# Patient Record
Sex: Female | Born: 1960
Health system: Southern US, Community
[De-identification: ages and names within clinical notes are randomized; demographics above are authoritative.]

## PROBLEM LIST (undated history)

## (undated) DIAGNOSIS — N6019 Diffuse cystic mastopathy of unspecified breast: Secondary | ICD-10-CM

## (undated) DIAGNOSIS — K219 Gastro-esophageal reflux disease without esophagitis: Secondary | ICD-10-CM

## (undated) DIAGNOSIS — Z780 Asymptomatic menopausal state: Secondary | ICD-10-CM

## (undated) DIAGNOSIS — Z923 Personal history of irradiation: Secondary | ICD-10-CM

## (undated) DIAGNOSIS — C50919 Malignant neoplasm of unspecified site of unspecified female breast: Secondary | ICD-10-CM

## (undated) DIAGNOSIS — D649 Anemia, unspecified: Secondary | ICD-10-CM

## (undated) DIAGNOSIS — F410 Panic disorder [episodic paroxysmal anxiety] without agoraphobia: Secondary | ICD-10-CM

## (undated) DIAGNOSIS — E785 Hyperlipidemia, unspecified: Secondary | ICD-10-CM

## (undated) DIAGNOSIS — M1990 Unspecified osteoarthritis, unspecified site: Secondary | ICD-10-CM

## (undated) DIAGNOSIS — C801 Malignant (primary) neoplasm, unspecified: Secondary | ICD-10-CM

## (undated) HISTORY — PX: COLONOSCOPY: SHX174

## (undated) HISTORY — DX: Unspecified osteoarthritis, unspecified site: M19.90

## (undated) HISTORY — DX: Diffuse cystic mastopathy of unspecified breast: N60.19

## (undated) HISTORY — PX: VAGINAL HYSTERECTOMY: SUR661

## (undated) HISTORY — DX: Panic disorder (episodic paroxysmal anxiety): F41.0

## (undated) HISTORY — PX: OTHER SURGICAL HISTORY: SHX169

## (undated) HISTORY — DX: Hyperlipidemia, unspecified: E78.5

## (undated) HISTORY — DX: Asymptomatic menopausal state: Z78.0

## (undated) HISTORY — DX: Anemia, unspecified: D64.9

## (undated) HISTORY — PX: PILONIDAL CYST EXCISION: SHX744

---

## 1998-01-16 ENCOUNTER — Inpatient Hospital Stay (HOSPITAL_COMMUNITY)
Admission: AD | Admit: 1998-01-16 | Discharge: 1998-01-16 | Payer: Self-pay | Admitting: Physical Medicine & Rehabilitation

## 1998-01-25 ENCOUNTER — Inpatient Hospital Stay (HOSPITAL_COMMUNITY): Admission: AD | Admit: 1998-01-25 | Discharge: 1998-01-25 | Payer: Self-pay | Admitting: Obstetrics and Gynecology

## 1998-02-07 ENCOUNTER — Other Ambulatory Visit: Admission: RE | Admit: 1998-02-07 | Discharge: 1998-02-07 | Payer: Self-pay | Admitting: Obstetrics & Gynecology

## 1998-05-11 ENCOUNTER — Inpatient Hospital Stay (HOSPITAL_COMMUNITY): Admission: AD | Admit: 1998-05-11 | Discharge: 1998-06-01 | Payer: Self-pay | Admitting: Obstetrics & Gynecology

## 1998-05-12 ENCOUNTER — Encounter: Payer: Self-pay | Admitting: Obstetrics and Gynecology

## 1998-05-13 ENCOUNTER — Encounter: Payer: Self-pay | Admitting: Obstetrics and Gynecology

## 1998-05-15 ENCOUNTER — Encounter: Payer: Self-pay | Admitting: Obstetrics and Gynecology

## 1998-05-19 ENCOUNTER — Encounter: Payer: Self-pay | Admitting: Obstetrics and Gynecology

## 1998-05-27 ENCOUNTER — Encounter: Payer: Self-pay | Admitting: Obstetrics and Gynecology

## 1998-05-31 ENCOUNTER — Encounter (HOSPITAL_COMMUNITY): Admission: RE | Admit: 1998-05-31 | Discharge: 1998-08-29 | Payer: Self-pay | Admitting: Obstetrics and Gynecology

## 1998-07-06 ENCOUNTER — Other Ambulatory Visit: Admission: RE | Admit: 1998-07-06 | Discharge: 1998-07-06 | Payer: Self-pay | Admitting: Obstetrics & Gynecology

## 1998-08-16 ENCOUNTER — Inpatient Hospital Stay (HOSPITAL_COMMUNITY): Admission: AD | Admit: 1998-08-16 | Discharge: 1998-08-17 | Payer: Self-pay | Admitting: Obstetrics and Gynecology

## 1999-07-13 ENCOUNTER — Other Ambulatory Visit: Admission: RE | Admit: 1999-07-13 | Discharge: 1999-07-13 | Payer: Self-pay | Admitting: Obstetrics and Gynecology

## 2001-08-27 ENCOUNTER — Other Ambulatory Visit: Admission: RE | Admit: 2001-08-27 | Discharge: 2001-08-27 | Payer: Self-pay | Admitting: Obstetrics and Gynecology

## 2002-09-09 ENCOUNTER — Other Ambulatory Visit: Admission: RE | Admit: 2002-09-09 | Discharge: 2002-09-09 | Payer: Self-pay | Admitting: Physical Therapy

## 2003-11-03 ENCOUNTER — Encounter: Admission: RE | Admit: 2003-11-03 | Discharge: 2003-11-03 | Payer: Self-pay | Admitting: Obstetrics and Gynecology

## 2004-05-05 ENCOUNTER — Encounter: Admission: RE | Admit: 2004-05-05 | Discharge: 2004-05-05 | Payer: Self-pay | Admitting: Obstetrics and Gynecology

## 2004-08-29 ENCOUNTER — Ambulatory Visit: Payer: Self-pay | Admitting: Family Medicine

## 2004-11-20 ENCOUNTER — Ambulatory Visit: Payer: Self-pay | Admitting: Family Medicine

## 2004-12-27 ENCOUNTER — Ambulatory Visit: Payer: Self-pay | Admitting: Family Medicine

## 2006-04-01 ENCOUNTER — Ambulatory Visit: Payer: Self-pay | Admitting: Family Medicine

## 2006-04-10 ENCOUNTER — Other Ambulatory Visit: Admission: RE | Admit: 2006-04-10 | Discharge: 2006-04-10 | Payer: Self-pay | Admitting: Family Medicine

## 2006-04-10 ENCOUNTER — Ambulatory Visit: Payer: Self-pay | Admitting: Family Medicine

## 2006-04-19 ENCOUNTER — Encounter: Admission: RE | Admit: 2006-04-19 | Discharge: 2006-04-19 | Payer: Self-pay | Admitting: Family Medicine

## 2006-05-21 ENCOUNTER — Ambulatory Visit: Payer: Self-pay | Admitting: Family Medicine

## 2006-09-06 ENCOUNTER — Emergency Department: Payer: Self-pay | Admitting: Emergency Medicine

## 2006-10-23 ENCOUNTER — Telehealth (INDEPENDENT_AMBULATORY_CARE_PROVIDER_SITE_OTHER): Payer: Self-pay | Admitting: *Deleted

## 2007-03-22 ENCOUNTER — Ambulatory Visit: Payer: Self-pay | Admitting: Family Medicine

## 2007-04-17 ENCOUNTER — Telehealth: Payer: Self-pay | Admitting: Family Medicine

## 2007-06-04 ENCOUNTER — Encounter: Admission: RE | Admit: 2007-06-04 | Discharge: 2007-06-04 | Payer: Self-pay | Admitting: Family Medicine

## 2007-06-11 ENCOUNTER — Encounter: Admission: RE | Admit: 2007-06-11 | Discharge: 2007-06-11 | Payer: Self-pay | Admitting: Family Medicine

## 2007-06-17 DIAGNOSIS — R928 Other abnormal and inconclusive findings on diagnostic imaging of breast: Secondary | ICD-10-CM | POA: Insufficient documentation

## 2007-07-25 ENCOUNTER — Encounter: Payer: Self-pay | Admitting: Family Medicine

## 2007-09-08 ENCOUNTER — Encounter: Admission: RE | Admit: 2007-09-08 | Discharge: 2007-09-08 | Payer: Self-pay | Admitting: General Surgery

## 2007-10-10 ENCOUNTER — Telehealth: Payer: Self-pay | Admitting: Family Medicine

## 2007-10-17 ENCOUNTER — Encounter: Payer: Self-pay | Admitting: Family Medicine

## 2007-10-17 DIAGNOSIS — F410 Panic disorder [episodic paroxysmal anxiety] without agoraphobia: Secondary | ICD-10-CM | POA: Insufficient documentation

## 2007-10-20 ENCOUNTER — Ambulatory Visit: Payer: Self-pay | Admitting: Family Medicine

## 2007-10-20 DIAGNOSIS — N6019 Diffuse cystic mastopathy of unspecified breast: Secondary | ICD-10-CM | POA: Insufficient documentation

## 2007-10-21 LAB — CONVERTED CEMR LAB
Alkaline Phosphatase: 66 units/L (ref 39–117)
BUN: 11 mg/dL (ref 6–23)
Basophils Absolute: 0 10*3/uL (ref 0.0–0.1)
Bilirubin, Direct: 0.1 mg/dL (ref 0.0–0.3)
CO2: 29 meq/L (ref 19–32)
Calcium: 9.3 mg/dL (ref 8.4–10.5)
Creatinine, Ser: 0.7 mg/dL (ref 0.4–1.2)
Eosinophils Relative: 1.6 % (ref 0.0–5.0)
GFR calc Af Amer: 115 mL/min
HCT: 35.3 % — ABNORMAL LOW (ref 36.0–46.0)
Hemoglobin: 11.4 g/dL — ABNORMAL LOW (ref 12.0–15.0)
LDL Cholesterol: 101 mg/dL — ABNORMAL HIGH (ref 0–99)
Monocytes Relative: 10.6 % (ref 3.0–12.0)
RBC: 4.08 M/uL (ref 3.87–5.11)
RDW: 13.2 % (ref 11.5–14.6)
Total Bilirubin: 0.7 mg/dL (ref 0.3–1.2)
Total Protein: 7.3 g/dL (ref 6.0–8.3)
VLDL: 11 mg/dL (ref 0–40)
WBC: 4 10*3/uL — ABNORMAL LOW (ref 4.5–10.5)

## 2007-11-12 ENCOUNTER — Ambulatory Visit: Payer: Self-pay | Admitting: Family Medicine

## 2007-11-12 LAB — FECAL OCCULT BLOOD, GUAIAC

## 2007-11-14 ENCOUNTER — Ambulatory Visit: Payer: Self-pay | Admitting: Family Medicine

## 2007-11-14 DIAGNOSIS — D649 Anemia, unspecified: Secondary | ICD-10-CM | POA: Insufficient documentation

## 2007-11-17 LAB — CONVERTED CEMR LAB
Eosinophils Relative: 1.6 % (ref 0.0–5.0)
HCT: 38.1 % (ref 36.0–46.0)
Lymphocytes Relative: 47.4 % — ABNORMAL HIGH (ref 12.0–46.0)
MCHC: 32.7 g/dL (ref 30.0–36.0)
MCV: 85.7 fL (ref 78.0–100.0)
Monocytes Relative: 11.1 % (ref 3.0–12.0)
Neutro Abs: 1.3 10*3/uL — ABNORMAL LOW (ref 1.4–7.7)
Platelets: 256 10*3/uL (ref 150–400)
RDW: 13.2 % (ref 11.5–14.6)
Saturation Ratios: 13.2 % — ABNORMAL LOW (ref 20.0–50.0)

## 2008-01-06 ENCOUNTER — Telehealth (INDEPENDENT_AMBULATORY_CARE_PROVIDER_SITE_OTHER): Payer: Self-pay | Admitting: *Deleted

## 2008-02-27 ENCOUNTER — Encounter: Admission: RE | Admit: 2008-02-27 | Discharge: 2008-02-27 | Payer: Self-pay | Admitting: General Surgery

## 2008-05-05 ENCOUNTER — Ambulatory Visit: Payer: Self-pay | Admitting: Family Medicine

## 2008-06-01 ENCOUNTER — Encounter: Admission: RE | Admit: 2008-06-01 | Discharge: 2008-06-01 | Payer: Self-pay | Admitting: General Surgery

## 2008-06-01 ENCOUNTER — Encounter: Payer: Self-pay | Admitting: Family Medicine

## 2008-06-09 ENCOUNTER — Telehealth: Payer: Self-pay | Admitting: Family Medicine

## 2008-10-06 ENCOUNTER — Encounter: Payer: Self-pay | Admitting: Family Medicine

## 2008-10-22 ENCOUNTER — Ambulatory Visit: Payer: Self-pay | Admitting: Family Medicine

## 2008-10-27 LAB — CONVERTED CEMR LAB
AST: 21 units/L (ref 0–37)
Albumin: 3.6 g/dL (ref 3.5–5.2)
Alkaline Phosphatase: 53 units/L (ref 39–117)
Basophils Absolute: 0 10*3/uL (ref 0.0–0.1)
Basophils Relative: 0.7 % (ref 0.0–3.0)
Bilirubin, Direct: 0.2 mg/dL (ref 0.0–0.3)
CO2: 29 meq/L (ref 19–32)
Calcium: 9.2 mg/dL (ref 8.4–10.5)
Creatinine, Ser: 0.7 mg/dL (ref 0.4–1.2)
GFR calc non Af Amer: 114.13 mL/min (ref >60)
Glucose, Bld: 88 mg/dL (ref 70–99)
HCT: 36.1 % (ref 36.0–46.0)
Hemoglobin: 11.9 g/dL — ABNORMAL LOW (ref 12.0–15.0)
Lymphocytes Relative: 43.4 % (ref 12.0–46.0)
Lymphs Abs: 1.7 10*3/uL (ref 0.7–4.0)
Neutro Abs: 1.6 10*3/uL (ref 1.4–7.7)
RBC: 4.21 M/uL (ref 3.87–5.11)
Sodium: 140 meq/L (ref 135–145)
TSH: 0.67 microintl units/mL (ref 0.35–5.50)
WBC: 3.8 10*3/uL — ABNORMAL LOW (ref 4.5–10.5)

## 2008-10-29 ENCOUNTER — Telehealth: Payer: Self-pay | Admitting: Family Medicine

## 2008-11-02 ENCOUNTER — Telehealth: Payer: Self-pay | Admitting: Family Medicine

## 2008-12-16 ENCOUNTER — Ambulatory Visit: Payer: Self-pay | Admitting: Family Medicine

## 2008-12-22 LAB — CONVERTED CEMR LAB
Eosinophils Relative: 1.2 % (ref 0.0–5.0)
Lymphocytes Relative: 39.2 % (ref 12.0–46.0)
Lymphs Abs: 1.5 10*3/uL (ref 0.7–4.0)
MCV: 86.2 fL (ref 78.0–100.0)
Neutro Abs: 1.9 10*3/uL (ref 1.4–7.7)
Neutrophils Relative %: 47 % (ref 43.0–77.0)
RDW: 12.9 % (ref 11.5–14.6)

## 2008-12-27 ENCOUNTER — Ambulatory Visit: Payer: Self-pay | Admitting: Family Medicine

## 2008-12-30 LAB — CONVERTED CEMR LAB
Basophils Absolute: 0 10*3/uL (ref 0.0–0.1)
Basophils Relative: 0.4 % (ref 0.0–3.0)
Folate: 18.1 ng/mL
Lymphocytes Relative: 46.7 % — ABNORMAL HIGH (ref 12.0–46.0)
Monocytes Absolute: 0.4 10*3/uL (ref 0.1–1.0)
Monocytes Relative: 10.6 % (ref 3.0–12.0)
Neutro Abs: 1.7 10*3/uL (ref 1.4–7.7)
Neutrophils Relative %: 41.2 % — ABNORMAL LOW (ref 43.0–77.0)
Platelets: 240 10*3/uL (ref 150.0–400.0)
Saturation Ratios: 33.1 % (ref 20.0–50.0)
Transferrin: 200.6 mg/dL — ABNORMAL LOW (ref 212.0–360.0)
WBC: 4.1 10*3/uL — ABNORMAL LOW (ref 4.5–10.5)

## 2009-05-03 ENCOUNTER — Telehealth: Payer: Self-pay | Admitting: Family Medicine

## 2009-06-06 ENCOUNTER — Encounter: Admission: RE | Admit: 2009-06-06 | Discharge: 2009-06-06 | Payer: Self-pay | Admitting: Obstetrics and Gynecology

## 2009-06-06 ENCOUNTER — Ambulatory Visit: Payer: Self-pay | Admitting: Family Medicine

## 2009-06-06 DIAGNOSIS — F4321 Adjustment disorder with depressed mood: Secondary | ICD-10-CM | POA: Insufficient documentation

## 2009-07-12 ENCOUNTER — Telehealth: Payer: Self-pay | Admitting: Family Medicine

## 2010-01-04 ENCOUNTER — Telehealth: Payer: Self-pay | Admitting: Family Medicine

## 2010-01-17 ENCOUNTER — Telehealth (INDEPENDENT_AMBULATORY_CARE_PROVIDER_SITE_OTHER): Payer: Self-pay | Admitting: *Deleted

## 2010-01-17 ENCOUNTER — Ambulatory Visit: Payer: Self-pay | Admitting: Family Medicine

## 2010-01-18 LAB — CONVERTED CEMR LAB
ALT: 25 U/L (ref 0–35)
AST: 23 U/L (ref 0–37)
Albumin: 3.7 g/dL (ref 3.5–5.2)
Alkaline Phosphatase: 65 U/L (ref 39–117)
BUN: 15 mg/dL (ref 6–23)
Basophils Absolute: 0 K/uL (ref 0.0–0.1)
Basophils Relative: 1 % (ref 0.0–3.0)
Bilirubin, Direct: 0.1 mg/dL (ref 0.0–0.3)
CO2: 30 meq/L (ref 19–32)
Calcium: 9.7 mg/dL (ref 8.4–10.5)
Chloride: 112 meq/L (ref 96–112)
Cholesterol: 191 mg/dL (ref 0–200)
Creatinine, Ser: 0.7 mg/dL (ref 0.4–1.2)
Eosinophils Absolute: 0.1 K/uL (ref 0.0–0.7)
Eosinophils Relative: 2.2 % (ref 0.0–5.0)
GFR calc non Af Amer: 117.42 mL/min (ref >60)
Glucose, Bld: 88 mg/dL (ref 70–99)
HCT: 34.7 % — ABNORMAL LOW (ref 36.0–46.0)
HDL: 73 mg/dL (ref >39.00)
Hemoglobin: 11.4 g/dL — ABNORMAL LOW (ref 12.0–15.0)
LDL Cholesterol: 108 mg/dL — ABNORMAL HIGH (ref 0–99)
Lymphocytes Relative: 46.8 % — ABNORMAL HIGH (ref 12.0–46.0)
Lymphs Abs: 1.7 K/uL (ref 0.7–4.0)
MCHC: 33 g/dL (ref 30.0–36.0)
MCV: 86.2 fL (ref 78.0–100.0)
Monocytes Absolute: 0.4 K/uL (ref 0.1–1.0)
Monocytes Relative: 11.8 % (ref 3.0–12.0)
Neutro Abs: 1.4 K/uL (ref 1.4–7.7)
Neutrophils Relative %: 38.2 % — ABNORMAL LOW (ref 43.0–77.0)
Platelets: 245 K/uL (ref 150.0–400.0)
Potassium: 4.6 meq/L (ref 3.5–5.1)
RBC: 4.03 M/uL (ref 3.87–5.11)
RDW: 14.4 % (ref 11.5–14.6)
Sodium: 145 meq/L (ref 135–145)
TSH: 1.04 u[IU]/mL (ref 0.35–5.50)
Total Bilirubin: 0.6 mg/dL (ref 0.3–1.2)
Total CHOL/HDL Ratio: 3
Total Protein: 6.8 g/dL (ref 6.0–8.3)
Triglycerides: 49 mg/dL (ref 0.0–149.0)
VLDL: 9.8 mg/dL (ref 0.0–40.0)
WBC: 3.7 10*3/microliter — ABNORMAL LOW (ref 4.5–10.5)

## 2010-01-26 ENCOUNTER — Ambulatory Visit: Payer: Self-pay | Admitting: Family Medicine

## 2010-01-27 ENCOUNTER — Encounter (INDEPENDENT_AMBULATORY_CARE_PROVIDER_SITE_OTHER): Payer: Self-pay | Admitting: *Deleted

## 2010-04-12 ENCOUNTER — Encounter (INDEPENDENT_AMBULATORY_CARE_PROVIDER_SITE_OTHER): Payer: Self-pay | Admitting: *Deleted

## 2010-06-01 ENCOUNTER — Encounter (INDEPENDENT_AMBULATORY_CARE_PROVIDER_SITE_OTHER): Payer: Self-pay

## 2010-06-06 ENCOUNTER — Ambulatory Visit: Payer: Self-pay | Admitting: Gastroenterology

## 2010-06-07 ENCOUNTER — Encounter
Admission: RE | Admit: 2010-06-07 | Discharge: 2010-06-07 | Payer: Self-pay | Source: Home / Self Care | Attending: Obstetrics and Gynecology | Admitting: Obstetrics and Gynecology

## 2010-06-07 LAB — HM MAMMOGRAPHY: HM Mammogram: NORMAL

## 2010-06-08 ENCOUNTER — Telehealth: Payer: Self-pay | Admitting: Family Medicine

## 2010-06-13 ENCOUNTER — Encounter: Payer: Self-pay | Admitting: Family Medicine

## 2010-06-13 ENCOUNTER — Encounter (INDEPENDENT_AMBULATORY_CARE_PROVIDER_SITE_OTHER): Payer: Self-pay | Admitting: *Deleted

## 2010-06-20 ENCOUNTER — Encounter: Payer: Self-pay | Admitting: Gastroenterology

## 2010-06-20 ENCOUNTER — Ambulatory Visit
Admission: RE | Admit: 2010-06-20 | Discharge: 2010-06-20 | Payer: Self-pay | Source: Home / Self Care | Attending: Gastroenterology | Admitting: Gastroenterology

## 2010-06-20 LAB — HM COLONOSCOPY

## 2010-07-18 NOTE — Progress Notes (Signed)
Summary: Rx Alprazolam  Phone Note Refill Request Call back at [phone] Message from:  Patient on July 12, 2009 12:55 PM  Refills Requested: Medication #1:  ALPRAZOLAM 0.5 MG  TABS 1 by mouth two times a day as needed anxiety Patient would like a refill on Alprazolam.  She said she recently had death in her family.  Please advise.  [facility]     Method Requested: Telephone to Pharmacy Initial call taken by: Linde Gillis CMA Duncan Dull),  July 12, 2009 12:56 PM  Follow-up for Phone Call        use this sparingly and with caution--no driving etc -- and remind her is habit forming- to not use regularly px written on EMR for call in  Follow-up by: Judith Part MD,  July 12, 2009 1:46 PM  Additional Follow-up for Phone Call Additional follow up Details #1::        Med called to [facility] advising pt. Additional Follow-up by: Lowella Petties CMA,  July 12, 2009 2:58 PM    Prescriptions: ALPRAZOLAM 0.5 MG  TABS (ALPRAZOLAM) 1 by mouth two times a day as needed anxiety  #30 x 0   Entered and Authorized by:   Judith Part MD   Signed by:   Judith Part MD on 07/12/2009   Method used:   Telephoned to ...         RxID:   [id number]

## 2010-07-18 NOTE — Progress Notes (Signed)
----   Converted from flag ---- ---- 01/16/2010 9:45 PM, Judith Part MD wrote: please check lipid, wellness v70.0-thanks   ---- 01/16/2010 12:05 PM, Liane Comber CMA (AAMA) wrote: Pt is scheduled for cpx labs tomorrow, what labs to draw and dx codes? Thanks Tasha ------------------------------

## 2010-07-18 NOTE — Progress Notes (Signed)
Summary: refill request for alprazolam  Phone Note Refill Request Message from:  Fax from Pharmacy  Refills Requested: Medication #1:  ALPRAZOLAM 0.5 MG  TABS 1 by mouth two times a day as needed anxiety   Last Refilled: 07/12/2009 Faxed request from [facility],  [phone].  Initial call taken by: Lowella Petties CMA,  January 04, 2010 8:12 AM  Follow-up for Phone Call        she has f/u with me later this month Follow-up by: Judith Part MD,  January 04, 2010 8:39 AM  Additional Follow-up for Phone Call Additional follow up Details #1::        [facility] as instructed.Lewanda Rife LPN  January 04, 2010 11:17 AM     New/Updated Medications: ALPRAZOLAM 0.5 MG  TABS (ALPRAZOLAM) 1 by mouth two times a day as needed anxiety Prescriptions: ALPRAZOLAM 0.5 MG  TABS (ALPRAZOLAM) 1 by mouth two times a day as needed anxiety  #30 x 0   Entered and Authorized by:   Judith Part MD   Signed by:   Lewanda Rife LPN on 45/40/9811   Method used:   Telephoned to ...         RxID:   [id number]

## 2010-07-18 NOTE — Assessment & Plan Note (Signed)
Summary: CPX/DLO   Vital Signs:  Patient profile:   50 year old female Height:      63 inches Weight:      165 pounds BMI:     29.33 Temp:     98 degrees F oral Pulse rate:   64 / minute Pulse rhythm:   regular BP sitting:   122 / 82  (left arm) Cuff size:   regular  Vitals Entered By: Lewanda Rife LPN (January 26, 2010 9:51 AM) CC: CPX GYN does pap   History of Present Illness: here for wellness exam  hot flashes have got the best of her  sees gyn-- Dr Rana Snare early in the year  takes black kohosh - works if consistent  does not want hrt currently  she can deal with it  occ trouble with sleep - took 1/2 xanax   some more anxiety  worse with menopause  drinks a lot of coffee or tea  knows she has to get off of them -- at least 3 bev per day  is up for trying daily med    wt is up 2 lb  hx of fibrocystic breasts  also breast ca in family   dexa nl 04- gyn has not checked another one  ca and D-- is taking well   Tdap 09  colon ca screen -- wants to go ahead and schedule her colonoscopy  is interested in october   lipids stable with HDL 73 and LDL 108- good   wbc 3.7 stable and hb 11.4 - also about the same    Allergies (verified): No Known Drug Allergies  Past History:  Past Medical History: Last updated: 10/22/2008 fibrocystic breasts strong family hx of breast cancer panic disorder  post menupausal 09  gyn- Dr Rana Snare   Past Surgical History: Last updated: 10/20/2007 Pilonidal cyst Fibroid tumors Caesarean section Hysterectomy- partial Dexa- per pt (2004) MRI breast--11/07 nl   Family History: Last updated: 10/20/2007 Father: heart problems, valve replaced Mother: ? 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 Eyes:  Denies blurring and eye irritation. CV:  Denies chest pain or discomfort, lightheadness, and palpitations. Resp:  Denies cough, shortness of breath, and wheezing. GI:  Denies abdominal pain, change in bowel habits, indigestion, and nausea. GU:  Denies abnormal vaginal bleeding, discharge, and urinary frequency. MS:  Denies joint pain, joint redness, and joint swelling. Derm:  Denies itching, lesion(s), poor wound healing, and rash. Neuro:  Denies numbness and tingling. Psych:  Denies anxiety and depression. Endo:  Denies cold intolerance, excessive thirst, excessive urination, and heat intolerance. Heme:  Denies abnormal bruising and bleeding.  Physical Exam  General:  overweight but generally well appearing  Head:  normocephalic, atraumatic, and no abnormalities observed.   Eyes:  vision grossly intact, pupils equal, pupils round, and pupils reactive to light.  no conjunctival pallor, injection or icterus  Ears:  R ear normal and L ear normal.   Nose:  no nasal discharge.   Mouth:  pharynx pink and moist.   Neck:  supple with full rom and no masses or thyromegally, no JVD or carotid bruit  Chest Wall:  No deformities, masses, or tenderness noted. Lungs:  Normal respiratory  effort, chest expands symmetrically. Lungs are clear to auscultation, no crackles or wheezes. Heart:  Normal rate and regular rhythm. S1 and S2 normal without gallop, murmur, click, rub or other extra sounds. Abdomen:  Bowel sounds positive,abdomen soft and non-tender without masses, organomegaly or hernias noted. no renal bruits  Msk:  No deformity or scoliosis noted of thoracic or lumbar spine.  no acute joint changes  Pulses:  R and L carotid,radial,femoral,dorsalis pedis and posterior tibial pulses are full and equal bilaterally Extremities:  No clubbing, cyanosis, edema, or deformity noted with normal full range of motion of all  joints.   Neurologic:  sensation intact to light touch, gait normal, and DTRs symmetrical and normal.   Skin:  Intact without suspicious lesions or rashes Cervical Nodes:  No lymphadenopathy noted Inguinal Nodes:  No significant adenopathy Psych:  normal affect, talkative and pleasant    Impression & Recommendations:  Problem # 1:  HEALTH MAINTENANCE EXAM (ICD-V70.0) Assessment Comment Only  reviewed health habits including diet, exercise and skin cancer prevention reviewed health maintenance list and family history   Orders: Prescription Created Electronically [phone])  Problem # 2:  ANEMIA (ICD-285.9) Assessment: Unchanged  this is stable without change continue to monitor no symptoms  colonosc also ordered for screening  Hgb: 11.4 (01/17/2010)   Hct: 34.7 (01/17/2010)   Platelets: 245.0 (01/17/2010) RBC: 4.03 (01/17/2010)   RDW: 14.4 (01/17/2010)   WBC: 3.7 (01/17/2010) MCV: 86.2 (01/17/2010)   MCHC: 33.0 (01/17/2010) Ferritin: 90.5 (12/27/2008) Iron: 93 (12/27/2008)   % Sat: 33.1 (12/27/2008) B12: 1103 (12/27/2008)   Folate: 18.1 (12/27/2008)   TSH: 1.04 (01/17/2010)  Orders: Prescription Created Electronically [phone])  Problem # 3:  PANIC DISORDER (ICD-300.01) Assessment: Deteriorated  anxiety is generally worsening - esp with menopause  will try buspar disc coping tech  alprazolam for breakthrough Her updated medication list for this problem includes:    Alprazolam 0.5 Mg Tabs (Alprazolam) .Marland Kitchen... 1 by mouth two times a day as needed anxiety    Buspirone Hcl 15 Mg Tabs (Buspirone hcl) .Marland Kitchen... 1/2 by mouth two times a day for anxiety  Discussed medication use and relaxation techniques.   Orders: Prescription Created Electronically [phone])  Complete Medication List: 1)  Alprazolam 0.5 Mg Tabs (Alprazolam) .Marland Kitchen.. 1 by mouth two times a day as needed anxiety 2)  Multivitamins Tabs (Multiple vitamin) .... Take 1 tablet by mouth once a day 3)  Buspirone Hcl 15 Mg Tabs  (Buspirone hcl) .... 1/2 by mouth two times a day for anxiety  Other Orders: Gastroenterology Referral (GI)  Patient Instructions: 1)  gradually transition to decaf by one one cup per day  2)  try buspar for anxiety 1/2 pill two times a day  3)  if you get worse or any side effects - stop it and let me know  4)  continue your calcium and vitamin D 5)  we will set up colonosc for fall at check out  6)  take the alprazolam only when needed  Prescriptions: BUSPIRONE HCL 15 MG TABS (BUSPIRONE HCL) 1/2 by mouth two times a day for anxiety  #30 x 11   Entered and Authorized by:   Judith Part MD   Signed by:   Judith Part MD on 01/26/2010   Method used:   [id number]       [address]       [place], [place]  [address]  Ph: [phone]       Fax: [phone]   RxID:   [id number]   Current Allergies (reviewed today): No known allergies    Preventive Care Screening  Mammogram:    Date:  08/16/2009    Results:  normal

## 2010-07-18 NOTE — Letter (Signed)
Summary: Previsit letter  [facility]  [address] [place], [place] [address]   Phone: [phone]  Fax: [phone]       01/27/2010 MRN: [id number]  Jamie Shaffer [address] [place], [place]  [address]  Dear Ms. [name],  Welcome to the Gastroenterology Division at [facility].    You are scheduled to see a nurse for your pre-procedure visit on 03-13-2010 at 4:00pm on the 3rd floor at [facility], [address].  We ask that you try to arrive at our office 15 minutes prior to your appointment time to allow for check-in.  Your nurse visit will consist of discussing your medical and surgical history, your immediate family medical history, and your medications.    Please bring a complete list of all your medications or, if you prefer, bring the medication bottles and we will list them.  We will need to be aware of both prescribed and over the counter drugs.  We will need to know exact dosage information as well.  If you are on blood thinners (Coumadin, Plavix, Aggrenox, Ticlid, etc.) please call our office today/prior to your appointment, as we need to consult with your physician about holding your medication.   Please be prepared to read and sign documents such as consent forms, a financial agreement, and acknowledgement forms.  If necessary, and with your consent, a friend or relative is welcome to sit-in on the nurse visit with you.  Please bring your insurance card so that we may make a copy of it.  If your insurance requires a referral to see a specialist, please bring your referral form from your primary care physician.  No co-pay is required for this nurse visit.     If you cannot keep your appointment, please call [phone] to cancel or reschedule prior to your appointment date.  This allows [place] the opportunity to schedule an appointment for another patient in need of care.    Thank you for choosing [facility] for your  medical needs.  We appreciate the opportunity to care for you.  Please visit [place] at our website  to learn more about our practice.                     Sincerely.                                                                                                                   The Gastroenterology Division

## 2010-07-18 NOTE — Miscellaneous (Signed)
Summary: CONTROLLED SUBSTANCES CONTRACT  CONTROLLED SUBSTANCES CONTRACT   Imported By: Wilder Glade 02/01/2010 12:45:44  _____________________________________________________________________  External Attachment:    Type:   Image     Comment:   External Document

## 2010-07-18 NOTE — Letter (Signed)
Summary: Pre Visit Letter Revised  [facility]  [address] [place], [place] [address]   Phone: [phone]  Fax: [phone]        04/12/2010 MRN: [id number] Jamie Shaffer [address] [place], [place]  [address]             Procedure Date:  06-20-09   Welcome to the Gastroenterology Division at [facility].    You are scheduled to see a nurse for your pre-procedure visit on 06-06-10 at 10:30a.m. on the 3rd floor at [facility], [address].  We ask that you try to arrive at our office 15 minutes prior to your appointment time to allow for check-in.  Please take a minute to review the attached form.  If you answer "Yes" to one or more of the questions on the first page, we ask that you call the person listed at your earliest opportunity.  If you answer "No" to all of the questions, please complete the rest of the form and bring it to your appointment.    Your nurse visit will consist of discussing your medical and surgical history, your immediate family medical history, and your medications.   If you are unable to list all of your medications on the form, please bring the medication bottles to your appointment and we will list them.  We will need to be aware of both prescribed and over the counter drugs.  We will need to know exact dosage information as well.    Please be prepared to read and sign documents such as consent forms, a financial agreement, and acknowledgement forms.  If necessary, and with your consent, a friend or relative is welcome to sit-in on the nurse visit with you.  Please bring your insurance card so that we may make a copy of it.  If your insurance requires a referral to see a specialist, please bring your referral form from your primary care physician.  No co-pay is required for this nurse visit.     If you cannot keep your appointment, please call [phone] to cancel or reschedule prior to your appointment date.  This  allows [place] the opportunity to schedule an appointment for another patient in need of care.    Thank you for choosing [facility] for your medical needs.  We appreciate the opportunity to care for you.  Please visit [place] at our website  to learn more about our practice.  Sincerely, The Gastroenterology Division

## 2010-07-20 NOTE — Miscellaneous (Signed)
Summary: Mammogram added to flowsheet   Clinical Lists Changes  Observations: Added new observation of MAMMO DUE: 06/2011 (06/13/2010 13:25) Added new observation of MAMMOGRAM: normal (06/07/2010 13:26)      Preventive Care Screening  Mammogram:    Date:  06/07/2010    Next Due:  06/2011    Results:  normal

## 2010-07-20 NOTE — Procedures (Signed)
Summary: Colonoscopy  Patient: Jamie Shaffer Note: All result statuses are Final unless otherwise noted.  Tests: (1) Colonoscopy (COL)   COL Colonoscopy           DONE     [facility]     [address]     [place], [place]  [address]           COLONOSCOPY PROCEDURE REPORT           PATIENT:  Jamie, Shaffer  MR#:  [id number]     BIRTHDATE:  [birth year], 49 yrs. old  GENDER:  female     ENDOSCOPIST:  Rachael Fee, MD     REF. BY:  Marne A. Milinda Antis, M.D.     PROCEDURE DATE:  06/20/2010     PROCEDURE:  Diagnostic Colonoscopy     ASA CLASS:  Class II     INDICATIONS:  Elevated Risk Screening, 3 second degree relatives     with colon cancer     MEDICATIONS:   Fentanyl 75 mcg IV, Versed 6 mg IV           DESCRIPTION OF PROCEDURE:   After the risks benefits and     alternatives of the procedure were thoroughly explained, informed     consent was obtained.  Digital rectal exam was performed and     revealed no rectal masses.   The LB [id number] endoscope     was introduced through the anus and advanced to the cecum, which     was identified by both the appendix and ileocecal valve, without     limitations.  The quality of the prep was good, using MoviPrep.     The instrument was then slowly withdrawn as the colon was fully     examined.     <<PROCEDUREIMAGES>>     FINDINGS:  External hemorrhoids were found. These were medium     sized, not thrombosed.  This was otherwise a normal examination of     the colon (see image1, image2, and image3).   Retroflexed views in     the rectum revealed no abnormalities.    The scope was then     withdrawn from the patient and the procedure completed.     COMPLICATIONS:  None           ENDOSCOPIC IMPRESSION:     1) External hemorrhoids     2) Otherwise normal examination; no polyps or cancers           RECOMMENDATIONS:     1) Given your significant family history of colon cancer, you     should have a repeat colonoscopy  in 5 years           REPEAT EXAM:  5 years           ______________________________     Rachael Fee, MD           n.     eSIGNED:   Rachael Fee at 06/20/2010 10:25 AM           Drema Balzarine, [id number]  Note: An exclamation mark (!) indicates a result that was not dispersed into the flowsheet. Document Creation Date: 06/20/2010 10:25 AM _______________________________________________________________________  (1) Order result status: Final Collection or observation date-time: 06/20/2010 10:21 Requested date-time:  Receipt date-time:  Reported date-time:  Referring Physician:   Ordering Physician: Rob Bunting [phone]) Specimen Source:  Source: Launa Grill Order Number: [id number] Lab site:  Appended Document: Colonoscopy    Clinical Lists Changes  Observations: Added new observation of COLONNXTDUE: 06/2015 (06/20/2010 13:41)

## 2010-07-20 NOTE — Miscellaneous (Signed)
Summary: Lec previsit  Clinical Lists Changes  Medications: Added new medication of MOVIPREP 100 GM  SOLR (PEG-KCL-NACL-NASULF-NA ASC-C) As per prep instructions. - Signed Rx of MOVIPREP 100 GM  SOLR (PEG-KCL-NACL-NASULF-NA ASC-C) As per prep instructions.;  #1 x 0;  Signed;  Entered by: Ulis Rias RN;  Authorized by: Rachael Fee MD;  Method used: [id number] [address], [place], [place]  [address], Ph: [phone], Fax: [phone] Observations: Added new observation of NKA: T (06/06/2010 10:33)    Prescriptions: MOVIPREP 100 GM  SOLR (PEG-KCL-NACL-NASULF-NA ASC-C) As per prep instructions.  #1 x 0   Entered by:   Ulis Rias RN   Authorized by:   Rachael Fee MD   Signed by:   Ulis Rias RN on 06/06/2010   Method used:   Electronically to        [id number]       [address]       [place], [place]  [address]       Ph: [phone]       Fax: [phone]   RxID:   [id number]

## 2010-07-20 NOTE — Letter (Signed)
Summary: Moviprep Instructions  [facility]  [address]   [place], [place] [address]   Phone: [phone]  Fax: [phone]       Jamie Shaffer    Jun 18, 1961    MRN: [id number]        Procedure Day Dorna Bloom: Tuesday, 06-20-10     Arrival Time: 9:00 a.m.      Procedure Time: 10:00 a.m.     Location of Procedure:                    x  [facility] (4th Floor)   PREPARATION FOR COLONOSCOPY WITH MOVIPREP   Starting 5 days prior to your procedure 06-15-10 do not eat nuts, seeds, popcorn, corn, beans, peas,  salads, or any raw vegetables.  Do not take any fiber supplements (e.g. Metamucil, Citrucel, and Benefiber).  THE DAY BEFORE YOUR PROCEDURE         DATE: 06-19-10   DAY: Monday  1.  Drink clear liquids the entire day-NO SOLID FOOD  2.  Do not drink anything colored red or purple.  Avoid juices with pulp.  No orange juice.  3.  Drink at least 64 oz. (8 glasses) of fluid/clear liquids during the day to prevent dehydration and help the prep work efficiently.  CLEAR LIQUIDS INCLUDE: Water Jello Ice Popsicles Tea (sugar ok, no milk/cream) Powdered fruit flavored drinks Coffee (sugar ok, no milk/cream) Gatorade Juice: apple, white grape, white cranberry  Lemonade Clear bullion, consomm, broth Carbonated beverages (any kind) Strained chicken noodle soup Hard Candy                             4.  In the morning, mix first dose of MoviPrep solution:    Empty 1 Pouch A and 1 Pouch B into the disposable container    Add lukewarm drinking water to the top line of the container. Mix to dissolve    Refrigerate (mixed solution should be used within 24 hrs)  5.  Begin drinking the prep at 5:00 p.m. The MoviPrep container is divided by 4 marks.   Every 15 minutes drink the solution down to the next mark (approximately 8 oz) until the full liter is complete.   6.  Follow completed prep with 16 oz of clear liquid of your choice (Nothing red or  purple).  Continue to drink clear liquids until bedtime.  7.  Before going to bed, mix second dose of MoviPrep solution:    Empty 1 Pouch A and 1 Pouch B into the disposable container    Add lukewarm drinking water to the top line of the container. Mix to dissolve    Refrigerate  THE DAY OF YOUR PROCEDURE      DATE: 06-20-10  DAY: Tuesday  Beginning at 5:00 a.m. (5 hours before procedure):         1. Every 15 minutes, drink the solution down to the next mark (approx 8 oz) until the full liter is complete.  2. Follow completed prep with 16 oz. of clear liquid of your choice.    3. You may drink clear liquids until 8:00 a.m. (2 HOURS BEFORE PROCEDURE).   MEDICATION INSTRUCTIONS  Unless otherwise instructed, you should take regular prescription medications with a small sip of water   as early as possible the morning of your procedure.       OTHER INSTRUCTIONS  You will need a responsible adult at least  52 years of age to accompany you and drive you home.   This person must remain in the waiting room during your procedure.  Wear loose fitting clothing that is easily removed.  Leave jewelry and other valuables at home.  However, you may wish to bring a book to read or  an iPod/MP3 player to listen to music as you wait for your procedure to start.  Remove all body piercing jewelry and leave at home.  Total time from sign-in until discharge is approximately 2-3 hours.  You should go home directly after your procedure and rest.  You can resume normal activities the  day after your procedure.  The day of your procedure you should not:   Drive   Make legal decisions   Operate machinery   Drink alcohol   Return to work  You will receive specific instructions about eating, activities and medications before you leave.    The above instructions have been reviewed and explained to me by   Ulis Rias RN  June 06, 2010 11:02 AM     I fully understand and can  verbalize these instructions _____________________________ Date _________

## 2010-07-20 NOTE — Letter (Signed)
Summary: Results Follow up Letter  [facility]  [address] [place], [place] [address]   Phone: [phone]  Fax: [phone]    06/13/2010 MRN: [id number]  MADGIE DHALIWAL [address] [place], [place]  [address]  Dear Ms. Walston,  The following are the results of your recent test(s):  Test         Result    Pap Smear:        Normal _____  Not Normal _____ Comments: ______________________________________________________ Cholesterol: LDL(Bad cholesterol):         Your goal is less than:         HDL (Good cholesterol):       Your goal is more than: Comments:  ______________________________________________________ Mammogram:        Normal __X___  Not Normal _____ Comments: Repeat in 1 year  ___________________________________________________________________ Hemoccult:        Normal _____  Not normal _______ Comments:    _____________________________________________________________________ Other Tests:    We routinely do not discuss normal results over the telephone.  If you desire a copy of the results, or you have any questions about this information we can discuss them at your next office visit.   Sincerely,      Sharilyn Sites for Dr. Roxy Manns

## 2010-07-20 NOTE — Progress Notes (Signed)
Summary: refill request for alprazolam  Phone Note Refill Request Message from:  Fax from Pharmacy  Refills Requested: Medication #1:  ALPRAZOLAM 0.5 MG  TABS 1 by mouth two times a day as needed anxiety   Last Refilled: 01/04/2010 Faxed request from [facility], [phone].  Initial call taken by: Lowella Petties CMA, AAMA,  June 08, 2010 4:41 PM  Follow-up for Phone Call        px written on EMR for call in  Follow-up by: Judith Part MD,  June 09, 2010 8:01 AM  Additional Follow-up for Phone Call Additional follow up Details #1::        Medication phoned to [facility] pharmacy as instructed. Lewanda Rife LPN  June 09, 2010 12:56 PM     Prescriptions: ALPRAZOLAM 0.5 MG  TABS (ALPRAZOLAM) 1 by mouth two times a day as needed anxiety  #30 x 0   Entered and Authorized by:   Judith Part MD   Signed by:   Lewanda Rife LPN on 78/29/5621   Method used:   Telephoned to ...         RxID:   [id number]

## 2010-08-14 ENCOUNTER — Telehealth: Payer: Self-pay | Admitting: Family Medicine

## 2010-08-24 NOTE — Progress Notes (Signed)
Summary: call a nurse   Phone Note Call from Patient   Caller: Patient Call For: Judith Part MD Summary of Call: Triage Record Num: [id number] Operator: Caswell Corwin Patient Name: Jamie Shaffer Call Date & Time: 08/11/2010 9:48:31PM Patient Phone: [phone] PCP: Patient Gender: Female PCP Fax : Patient DOB: 1960/07/19 Practice Name: [facility] Reason for Call: Pt calling that she is having body aches, H/A. chills and just feels bad. SX started 08/10/10. Temp is 100.4. Last voided at 2140. Triaged flu-like sx. Has taken zyrtec and took some sinus med this AM. Has a H/A that keeps returning. Home care and call back inst given. Inst needs to be seen at [facility] in 24 hrs. Protocol(s) Used: Flu-Like Symptoms Recommended Outcome per Protocol: See Provider within 24 hours Reason for Outcome: Facial pain (fullness, pressure, worsens with bending over), frontal headache, yellow-green nasal discharge AND any temperature elevation Care Advice:  ~ Use a cool mist humidifier to moisten air. Be sure to clean according to manufacturer's instructions.  ~ Rest until symptoms improve. Speak with provider if swelling, redness and tenderness over sinuses above or below eyes develops or has temperature greater than 101.5 F (38.6 C).  ~  ~ Consider use of a saline nasal spray per package directions to help relieve nasal congestion.  ~ If you can, stop smoking now and avoid all secondhand smoke. A warm, moist compress placed on face, over eyes for 15 to 20 minutes, 5 to 6 times a day, may help relieve the congestion.  ~ Consider nonprescription decongestant (Sudafed, Drixoral) for relief of symptoms after checking with a provider, especially if there is a history of hypertension, hyperthyroidism, heart disease, diabetes, glaucoma, urinary retention caused by prostatic hypertrophy.  ~ Nasal Irrigation To Relieve Congestion: - Wash hands with soap and water. - Add 1/4 teaspoon salt to  2 cups warm water to make saltwater solution. - Use a bulb syringe to draw up the saltwater solution. Turn the bulb upright to squeeze out any air. Fill the syringe until is full. Initial call taken by: Melody Comas,  August 14, 2010 9:31 AM

## 2010-11-21 ENCOUNTER — Telehealth: Payer: Self-pay | Admitting: *Deleted

## 2010-11-21 NOTE — Telephone Encounter (Signed)
Patient notified as instructed by telephone. 

## 2010-11-21 NOTE — Telephone Encounter (Signed)
Patient left message stating that her back has been hurting and was asking if she should be seen. Called her back to get more information, go no answer, left message for patient to call me back.

## 2010-11-21 NOTE — Telephone Encounter (Signed)
I cannot do muscle relaxer without visit I recommend instead of advil- try aleve 1-2 pills po bid with meals prn  Also gentle heat on back and frequent walking (no running and no stooping or heavy lifting) F/u if not improved or worse

## 2010-11-21 NOTE — Telephone Encounter (Signed)
Patient returned my call, she says that she was working out in her yard on Saturday planting flowers, picking up heavy pots. She says that since then she has noticed her back feeling really tight, especially at night when she is lying down. She is asking if she could possibly get a muscle relaxer for a couple of day. She says that she has tried advil and it isn't helping.

## 2010-11-23 ENCOUNTER — Ambulatory Visit (INDEPENDENT_AMBULATORY_CARE_PROVIDER_SITE_OTHER): Payer: 59 | Admitting: Family Medicine

## 2010-11-23 ENCOUNTER — Telehealth: Payer: Self-pay | Admitting: *Deleted

## 2010-11-23 ENCOUNTER — Encounter: Payer: Self-pay | Admitting: Family Medicine

## 2010-11-23 VITALS — BP 120/80 | HR 69 | Temp 98.1°F | Ht 63.0 in | Wt 169.0 lb

## 2010-11-23 DIAGNOSIS — S335XXA Sprain of ligaments of lumbar spine, initial encounter: Secondary | ICD-10-CM

## 2010-11-23 DIAGNOSIS — S39012A Strain of muscle, fascia and tendon of lower back, initial encounter: Secondary | ICD-10-CM | POA: Insufficient documentation

## 2010-11-23 LAB — HM COLONOSCOPY

## 2010-11-23 MED ORDER — CYCLOBENZAPRINE HCL 5 MG PO TABS: ORAL_TABLET | ORAL | Status: DC

## 2010-11-23 NOTE — Patient Instructions (Signed)
You have musculoskeletal low back pain Take tylenol for baseline pain relief (1-2 extra strength tabs 3x/day) Aleve or meloxicam daily with food for pain and inflammation (if you do not have stomach or kidney issues). Flexeril as needed for muscle spasms (no driving on this medicine). Stay as active as possible. Do home exercises and stretches as directed - hold each for 20-30 seconds and do each one three times. Consider massage, chiropractor, physical therapy, and/or acupuncture. Physical therapy has been shown to be helpful while the others have mixed results. Strengthening of low back muscles, abdominal musculature are key for long term pain relief. If not improving, will consider further imaging (MRI) and/or other medications (neurontin, lyrica, nortriptyline) that help with pain.

## 2010-11-23 NOTE — Progress Notes (Signed)
50 yo here for back pain. She was working out in her yard on Saturday planting flowers, picking up heavy pots. She says that since then she has noticed her back feeling really tight, especially at night when she is lying down.  Has tried advil and it isn't helping.  No other associated symptoms. Never had anything like this before.  The PMH, PSH, Social History, Family History, Medications, and allergies have been reviewed in [facility], and have been updated if relevant.  ROS Weight loss: no  Worse at rest: no Fever: no Change in bowel or bladder function: no    Loss of strength loss or sensation: no  Meds, vitals, and allergies reviewed.   Physical exam: BP 120/80  Pulse 69  Temp(Src) 98.1 F (36.7 C) (Oral)  Ht 5\' 3"  (1.6 m)  Wt 169 lb (76.658 kg)  BMI 29.94 kg/m2 NAD NCAT Neck supple MMM RRR CTAB Back w/o midline pain. Paraspinal muscle tenderness: L3, L4 SLR: EXT w/o weakness, DTR wnl, sensation wnl  1. Lumbar strain   New. Continue supportive care- Tylenol, NSAIDs, flexeril. See pt instructions for details.

## 2010-11-23 NOTE — Telephone Encounter (Signed)
Pt was seen this morning and was told flexeril would be called in, it has not been.  Pt is asking that this be called in asap, she is in pain.  [facility].

## 2010-11-23 NOTE — Telephone Encounter (Signed)
Patient called and stated that Rx was not sent in earlier today after her appt.  Spoke with Dr. Dayton Martes and Rx was sent to [facility].

## 2011-02-04 ENCOUNTER — Telehealth: Payer: Self-pay | Admitting: Family Medicine

## 2011-02-04 DIAGNOSIS — Z0000 Encounter for general adult medical examination without abnormal findings: Secondary | ICD-10-CM | POA: Insufficient documentation

## 2011-02-04 DIAGNOSIS — D649 Anemia, unspecified: Secondary | ICD-10-CM

## 2011-02-04 NOTE — Telephone Encounter (Signed)
Message copied by Judy Pimple on Sun Feb 04, 2011  2:50 PM ------      Message from: Baldomero Lamy      Created: Mon Jan 29, 2011 10:33 AM      Regarding: cpx labs thurs 8/23       Please order  future cpx labs for pt's upcomming lab appt.      Thanks      Rodney Booze

## 2011-02-08 ENCOUNTER — Other Ambulatory Visit (INDEPENDENT_AMBULATORY_CARE_PROVIDER_SITE_OTHER): Payer: 59

## 2011-02-08 DIAGNOSIS — D649 Anemia, unspecified: Secondary | ICD-10-CM

## 2011-02-08 DIAGNOSIS — Z0000 Encounter for general adult medical examination without abnormal findings: Secondary | ICD-10-CM

## 2011-02-08 LAB — CBC WITH DIFFERENTIAL/PLATELET
Basophils Absolute: 0 10*3/uL (ref 0.0–0.1)
Eosinophils Relative: 1.9 % (ref 0.0–5.0)
Hemoglobin: 11.4 g/dL — ABNORMAL LOW (ref 12.0–15.0)
Lymphocytes Relative: 50.6 % — ABNORMAL HIGH (ref 12.0–46.0)
MCV: 85.2 fl (ref 78.0–100.0)
Monocytes Absolute: 0.5 10*3/uL (ref 0.1–1.0)
Monocytes Relative: 12.8 % — ABNORMAL HIGH (ref 3.0–12.0)
Neutro Abs: 1.2 10*3/uL — ABNORMAL LOW (ref 1.4–7.7)
Platelets: 266 10*3/uL (ref 150.0–400.0)
RBC: 4.19 Mil/uL (ref 3.87–5.11)
RDW: 15 % — ABNORMAL HIGH (ref 11.5–14.6)
WBC: 3.7 10*3/uL — ABNORMAL LOW (ref 4.5–10.5)

## 2011-02-08 LAB — COMPREHENSIVE METABOLIC PANEL
ALT: 17 U/L (ref 0–35)
AST: 20 U/L (ref 0–37)
Albumin: 4.1 g/dL (ref 3.5–5.2)
Alkaline Phosphatase: 71 U/L (ref 39–117)
Potassium: 4.9 mEq/L (ref 3.5–5.1)
Sodium: 141 mEq/L (ref 135–145)
Total Protein: 7.6 g/dL (ref 6.0–8.3)

## 2011-02-08 LAB — LIPID PANEL
LDL Cholesterol: 103 mg/dL — ABNORMAL HIGH (ref 0–99)
VLDL: 9.8 mg/dL (ref 0.0–40.0)

## 2011-02-13 ENCOUNTER — Ambulatory Visit (INDEPENDENT_AMBULATORY_CARE_PROVIDER_SITE_OTHER): Payer: 59 | Admitting: Family Medicine

## 2011-02-13 ENCOUNTER — Encounter: Payer: Self-pay | Admitting: Family Medicine

## 2011-02-13 DIAGNOSIS — Z23 Encounter for immunization: Secondary | ICD-10-CM

## 2011-02-13 DIAGNOSIS — Z0000 Encounter for general adult medical examination without abnormal findings: Secondary | ICD-10-CM

## 2011-02-13 DIAGNOSIS — D649 Anemia, unspecified: Secondary | ICD-10-CM

## 2011-02-13 NOTE — Patient Instructions (Addendum)
Tdap vaccine today  Take your multivitamin with iron  Keep up the good diet and exercise  See your gyn as scheduled

## 2011-02-13 NOTE — Progress Notes (Signed)
Subjective:    Patient ID: Jamie Shaffer, female    DOB: 04-Jun-1961, 50 y.o.   MRN: [id number]  HPI Here for annual health mt exam and to review chronic med problems  Has been feeling pretty good  Has her moments of feeling overwhelmed - lots of stress lately  Wt is down 8 lb- going to the gym and walking and trying to eat the right foods  Not eating at night    Gyn does exams- last visit was last sept -- and goes once per year  Had partial hyst- does pap still / vaginal exam -- last pap was normal - think (fibroids)  No gyn problems  Mam 12/11 normal - relieved by that ( no change in L breast finding)  Self exam- no lumps , and gyn has not found anything either  Strong fam hx of breast cancer Td 04  Is working with babies  colonosc 1/12 normal - told to r echeck 5 y due to fam hx   Anemia is stable- no menses  Ever since last child never rebounded  Takes mvi with iron  Lab Results  Component Value Date   WBC 3.7* 02/08/2011   HGB 11.4* 02/08/2011   HCT 35.7* 02/08/2011   MCV 85.2 02/08/2011   PLT 266.0 02/08/2011    Lipids are good Lab Results  Component Value Date   CHOL 198 02/08/2011   CHOL 191 01/17/2010   CHOL 184 10/20/2007   Lab Results  Component Value Date   HDL 85.10 02/08/2011   HDL 40.98 01/17/2010   HDL 72.4 10/20/2007   Lab Results  Component Value Date   LDLCALC 103* 02/08/2011   LDLCALC 108* 01/17/2010   LDLCALC 101* 10/20/2007   Lab Results  Component Value Date   TRIG 49.0 02/08/2011   TRIG 49.0 01/17/2010   TRIG 53 10/20/2007   Lab Results  Component Value Date   CHOLHDL 2 02/08/2011   CHOLHDL 3 01/17/2010   CHOLHDL 2.5 CALC 10/20/2007   No results found for this basename: LDLDIRECT     buspar not needed every day- does help  Makes her a bit nauseated  Does not need daily   Patient Active Problem List  Diagnoses  . ANEMIA  . PANIC DISORDER  . GRIEF REACTION  . FIBROCYSTIC BREAST DISEASE  . Routine general medical examination at a health care  facility   Past Medical History  Diagnosis Date  . Fibrocystic breast   . Panic disorder   . Post-menopausal    Past Surgical History  Procedure Date  . Pilonidal cyst excision   . Fibroid tumor   . Cesarean section   . Vaginal hysterectomy     partial   History  Substance Use Topics  . Smoking status: Never Smoker   . Smokeless tobacco: Not on file  . Alcohol Use: No   Family History  Problem Relation Age of Onset  . Hypertension Mother   . Diabetes Mother   . Cancer Mother     breast  . Cancer Sister     breast  . Cancer Sister     breast   No Known Allergies Current Outpatient Prescriptions on File Prior to Visit  Medication Sig Dispense Refill  . Multiple Vitamin (MULTIVITAMIN) tablet Take 1 tablet by mouth daily.        Marland Kitchen ALPRAZolam (XANAX) 0.5 MG tablet Take 0.5 mg by mouth 2 (two) times daily as needed.        Marland Kitchen  cyclobenzaprine (FLEXERIL) 5 MG tablet Take one tablet by mouth three times daily as needed for back pain (caution sedation)  60 tablet  0     Review of Systems Review of Systems  Constitutional: Negative for fever, appetite change, fatigue and unexpected weight change.  Eyes: Negative for pain and visual disturbance.  Respiratory: Negative for cough and shortness of breath.   Cardiovascular: Negative.  for cp or palpitations  Gastrointestinal: Negative for nausea, diarrhea and constipation.  Genitourinary: Negative for urgency and frequency.  Skin: Negative for pallor. Or rash  Neurological: Negative for weakness, light-headedness, numbness and headaches.  Hematological: Negative for adenopathy. Does not bruise/bleed easily.  Psychiatric/Behavioral: Negative for dysphoric mood. The patient is not nervous/anxious.          Objective:   Physical Exam  Constitutional: She appears well-developed and well-nourished. No distress.  HENT:  Head: Normocephalic and atraumatic.  Right Ear: External ear normal.  Left Ear: External ear normal.  Nose:  Nose normal.  Mouth/Throat: Oropharynx is clear and moist.  Eyes: Conjunctivae and EOM are normal. Pupils are equal, round, and reactive to light. No scleral icterus.  Neck: Normal range of motion. Neck supple. No JVD present. Carotid bruit is not present. No thyromegaly present.  Cardiovascular: Normal rate, regular rhythm, normal heart sounds and intact distal pulses.   Pulmonary/Chest: Effort normal and breath sounds normal. No respiratory distress. She has no wheezes.  Abdominal: Soft. Bowel sounds are normal. She exhibits no distension and no mass. There is no tenderness.  Musculoskeletal: Normal range of motion. She exhibits no edema and no tenderness.  Lymphadenopathy:    She has no cervical adenopathy.  Neurological: She is alert. She has normal reflexes. No cranial nerve deficit. Coordination normal.  Skin: Skin is warm and dry. No rash noted. No erythema. No pallor.  Psychiatric: She has a normal mood and affect.          Assessment & Plan:

## 2011-02-13 NOTE — Assessment & Plan Note (Signed)
Stable and chronic with nl colonoscopy  Will continue to monitor Continue mvi with iron and balanced diet

## 2011-02-13 NOTE — Assessment & Plan Note (Signed)
Reviewed health habits including diet and exercise and skin cancer prevention Also reviewed health mt list, fam hx and immunizations  Rev wellness labs in detail Commended wt loss and better health habits Tdap today

## 2011-05-14 ENCOUNTER — Other Ambulatory Visit: Payer: Self-pay | Admitting: Obstetrics and Gynecology

## 2011-05-14 DIAGNOSIS — Z1231 Encounter for screening mammogram for malignant neoplasm of breast: Secondary | ICD-10-CM

## 2011-06-15 ENCOUNTER — Ambulatory Visit
Admission: RE | Admit: 2011-06-15 | Discharge: 2011-06-15 | Disposition: A | Discharge status: Home / Self Care | Payer: 59 | Source: Ambulatory Visit | Attending: Obstetrics and Gynecology | Admitting: Obstetrics and Gynecology

## 2011-06-15 DIAGNOSIS — Z1231 Encounter for screening mammogram for malignant neoplasm of breast: Secondary | ICD-10-CM

## 2011-06-20 ENCOUNTER — Encounter: Payer: Self-pay | Admitting: *Deleted

## 2011-09-26 ENCOUNTER — Telehealth: Payer: Self-pay | Admitting: Family Medicine

## 2011-09-26 NOTE — Telephone Encounter (Signed)
Caller: Gerri/Patient is calling with a question about Buspar.The medication was written by Roxy Manns A. Buspar makes her nauseated for hours after taking the med. She istaking this with food. She ? if she may get another medication for anxiety. After triage, see in 72 hrs Anxiety Protocol. She is going to a funeral on 09/27/11 and out of town on 09/28/11. Appt is declined. Will try homecare measures to reduce her anxiety for now.

## 2011-09-26 NOTE — Telephone Encounter (Signed)
Patient advised as instructed via telephone, she stated that she will call back to schedule appt.

## 2011-09-26 NOTE — Telephone Encounter (Signed)
I want to talk to her about it since there are several options - in terms of picking one and disc poss side eff Please f/u when able

## 2011-09-27 ENCOUNTER — Telehealth: Payer: Self-pay | Admitting: Family Medicine

## 2011-09-27 NOTE — Telephone Encounter (Signed)
I answered this already 

## 2011-09-27 NOTE — Telephone Encounter (Signed)
Triage Record Num: [id number] Operator: Freddie Breech Patient Name: Jamie Shaffer Call Date & Time: 09/26/2011 4:45:26PM Patient Phone: [phone] PCP: Audrie Gallus. Tower Patient Gender: Female PCP Fax : Patient DOB: January 26, 1961 Practice Name: [facility] Reason for Call: Caller: Cali/Patient is calling with a question about Buspar.The medication was written by Roxy Manns A. Buspar makes her nauseated for hours after taking the med. She istaking this with food. She ? if she may get another medication for anxiety. After triage, see in 72 hrs Anxiety Protocol. She is going to a funeral on 09/27/11 and out of town on 09/28/11. Appt is declined. Will try homecare measures to reduce her anxiety for now. Protocol(s) Used: Anxiety Recommended Outcome per Protocol: See Provider within 72 Hours Reason for Outcome: New or increasing symptoms AND not currently in treatment; not taking medications/therapy; or change in medication or therapy Care Advice: ~ Identify factors that make symptoms worse or more frequent. Continue to follow your treatment plan. Take all medications as prescribed and keep all appointments with your provider. ~ Exercise, yoga, massage, relaxation, prayer, music, a hot bath, or journal writing are all ways that may help calm a person. Try these and find one or more that helps to relieve your anxiety and practice it when feeling stressed or worried. ~ ~ Eat a balanced diet and follow a regular sleep schedule with adequate sleep, about 7 to 8 hours a night. ~ List, or take, all current prescription(s), nonprescription or alternative medication(s) to provider for evaluation. Worry/Anxiety: - Does not prevent bad things from happening. - Is often about things that never happen. - When you start to worry, think about if it will matter in a week, month, or a year. - Talk with provider/counselor about the issues that cause you to worry. ~ Talk with your provider  if you are feeling "on the edge," having difficulty concentrating, difficulty sleeping, palpitations, headaches, or are having more difficulty carrying out daily routines. ~ 09/26/2011 4:56:58PM Page 1 of 1 [email or web address]

## 2011-11-21 ENCOUNTER — Ambulatory Visit: Payer: 59 | Admitting: Family Medicine

## 2012-01-30 ENCOUNTER — Telehealth: Payer: Self-pay | Admitting: Family Medicine

## 2012-01-30 NOTE — Telephone Encounter (Signed)
Patient has an appointment for a cpx at the end of October.  She doesn't have insurance.  She just found out that she needs to have a physical before the end of September in order to get insurance for next year.  Patient wants to know if you can see her before the end of September for the physical.

## 2012-01-30 NOTE — Telephone Encounter (Signed)
Go ahead and sched 30 min visit wherever you can fit her in  Let her know - that we will only be able to disc health mt at this visit --any new problems- schedule a separate appt before that  thanks

## 2012-02-25 ENCOUNTER — Telehealth: Payer: Self-pay | Admitting: Family Medicine

## 2012-02-25 DIAGNOSIS — Z0000 Encounter for general adult medical examination without abnormal findings: Secondary | ICD-10-CM

## 2012-02-25 NOTE — Telephone Encounter (Signed)
Message copied by Judy Pimple on Mon Feb 25, 2012  6:11 PM ------      Message from: Jamie Shaffer      Created: Tue Feb 12, 2012  3:42 PM      Regarding: Cpx labs Tues 9/10       Please order  future cpx labs for pt's upcomming lab appt.      Thanks      Rodney Booze

## 2012-02-26 ENCOUNTER — Other Ambulatory Visit (INDEPENDENT_AMBULATORY_CARE_PROVIDER_SITE_OTHER): Payer: 59

## 2012-02-26 DIAGNOSIS — Z0000 Encounter for general adult medical examination without abnormal findings: Secondary | ICD-10-CM

## 2012-02-26 DIAGNOSIS — Z13220 Encounter for screening for lipoid disorders: Secondary | ICD-10-CM

## 2012-02-26 LAB — LIPID PANEL
HDL: 89.5 mg/dL (ref >39.00)
Total CHOL/HDL Ratio: 2
Triglycerides: 58 mg/dL (ref 0.0–149.0)
VLDL: 11.6 mg/dL (ref 0.0–40.0)

## 2012-02-26 LAB — CBC WITH DIFFERENTIAL/PLATELET
Basophils Relative: 0.5 % (ref 0.0–3.0)
Eosinophils Absolute: 0.1 10*3/uL (ref 0.0–0.7)
Lymphs Abs: 1.5 10*3/uL (ref 0.7–4.0)
MCHC: 32 g/dL (ref 30.0–36.0)
Monocytes Absolute: 0.4 10*3/uL (ref 0.1–1.0)
Neutro Abs: 1.4 10*3/uL (ref 1.4–7.7)
WBC: 3.4 10*3/uL — ABNORMAL LOW (ref 4.5–10.5)

## 2012-02-26 LAB — COMPREHENSIVE METABOLIC PANEL
AST: 29 U/L (ref 0–37)
Albumin: 3.9 g/dL (ref 3.5–5.2)
Alkaline Phosphatase: 81 U/L (ref 39–117)
BUN: 17 mg/dL (ref 6–23)
Creatinine, Ser: 0.6 mg/dL (ref 0.4–1.2)
Potassium: 3.9 mEq/L (ref 3.5–5.1)
Total Bilirubin: 0.6 mg/dL (ref 0.3–1.2)

## 2012-03-04 ENCOUNTER — Encounter: Payer: Self-pay | Admitting: Family Medicine

## 2012-03-04 ENCOUNTER — Ambulatory Visit (INDEPENDENT_AMBULATORY_CARE_PROVIDER_SITE_OTHER): Payer: Self-pay | Admitting: Family Medicine

## 2012-03-04 VITALS — BP 130/80 | HR 72 | Temp 98.5°F | Ht 63.0 in | Wt 162.0 lb

## 2012-03-04 DIAGNOSIS — Z0289 Encounter for other administrative examinations: Secondary | ICD-10-CM

## 2012-03-04 DIAGNOSIS — Z021 Encounter for pre-employment examination: Secondary | ICD-10-CM | POA: Insufficient documentation

## 2012-03-04 DIAGNOSIS — Z0000 Encounter for general adult medical examination without abnormal findings: Secondary | ICD-10-CM

## 2012-03-04 DIAGNOSIS — D649 Anemia, unspecified: Secondary | ICD-10-CM

## 2012-03-04 DIAGNOSIS — Z23 Encounter for immunization: Secondary | ICD-10-CM

## 2012-03-04 NOTE — Patient Instructions (Addendum)
Flu shot today Keep up the good work with diet and exercise Consider calorie counting - weight watchers is a good program, there are other programs on line also  Lab for cotinine needs to be done at [facility] Then when that returns we can return form to you

## 2012-03-04 NOTE — Assessment & Plan Note (Signed)
Unchanged Nl colonosc in past  Has been life long Oral iron does not seem to change things Pt will try stopping it and see how she feels

## 2012-03-04 NOTE — Assessment & Plan Note (Signed)
Reviewed health habits including diet and exercise and skin cancer prevention Also reviewed health mt list, fam hx and immunizations  Rev wellness labs in detail today Disc strategy for wt loss and mt

## 2012-03-04 NOTE — Assessment & Plan Note (Signed)
Pt needs nicotine testing through [facility] for her insurance We cannot do that test here and get it back in time  She will have this done at a [facility] draw station

## 2012-03-04 NOTE — Progress Notes (Signed)
Subjective:    Patient ID: Jamie Shaffer, female    DOB: 04-Apr-1961, 51 y.o.   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   Wt is up 1 lb with bmi of 28 Diet- does a good job with diet for the most part  Very hard with menopause Exercise- walks and does water aerobics  She thinks she needs to count calories   Mood- has been ok, even with menopause  Some nt sweats and hot flashes   Anemia Lab Results  Component Value Date   WBC 3.4* 02/26/2012   HGB 11.4* 02/26/2012   HCT 35.6* 02/26/2012   MCV 85.1 02/26/2012   PLT 240.0 02/26/2012   takes iron 106 mg elemental iron daily -- one daily Not affecting hb  Colonoscopy is fine  Alt is slt elevated Lab Results  Component Value Date   ALT 38* 02/26/2012   AST 29 02/26/2012   ALKPHOS 81 02/26/2012   BILITOT 0.6 02/26/2012   not taking tylenol  Does not drink alcohol  Hx of partial hyst- had her gyn visit with her gyn last oct - goes every year  Still does pap smears   mammo 12/12- normal  Self exam-no lumps or changes   colonosc 6/12  Flu shot wants today  Lab Results  Component Value Date   CHOL 202* 02/26/2012   CHOL 198 02/08/2011   CHOL 191 01/17/2010   Lab Results  Component Value Date   HDL 89.50 02/26/2012   HDL 08.65 02/08/2011   HDL 78.46 01/17/2010   Lab Results  Component Value Date   LDLCALC 103* 02/08/2011   LDLCALC 108* 01/17/2010   LDLCALC 101* 10/20/2007   Lab Results  Component Value Date   TRIG 58.0 02/26/2012   TRIG 49.0 02/08/2011   TRIG 49.0 01/17/2010   Lab Results  Component Value Date   CHOLHDL 2 02/26/2012   CHOLHDL 2 02/08/2011   CHOLHDL 3 01/17/2010   Lab Results  Component Value Date   LDLDIRECT 93.0 02/26/2012      Chemistry      Component Value Date/Time   NA 140 02/26/2012 0829   K 3.9 02/26/2012 0829   CL 106 02/26/2012 0829   CO2 27 02/26/2012 0829   BUN 17 02/26/2012 0829   CREATININE 0.6 02/26/2012 0829      Component Value Date/Time   CALCIUM 9.5 02/26/2012 0829   ALKPHOS 81 02/26/2012 0829   AST 29 02/26/2012 0829   ALT 38* 02/26/2012 0829   BILITOT 0.6 02/26/2012 0829      No results found for this basename: HGBA1C   Her company demands a cotinine test to determine if she is a smoker She is not a smoker  Patient Active Problem List  Diagnosis  . ANEMIA  . PANIC DISORDER  . FIBROCYSTIC BREAST DISEASE  . Routine general medical examination at a health care facility   Past Medical History  Diagnosis Date  . Fibrocystic breast   . Panic disorder   . Post-menopausal    Past Surgical History  Procedure Date  . Pilonidal cyst excision   . Fibroid tumor   . Cesarean section   . Vaginal hysterectomy     partial   History  Substance Use Topics  . Smoking status: Never Smoker   .  Smokeless tobacco: Not on file  . Alcohol Use: No   Family History  Problem Relation Age of Onset  . Hypertension Mother   . Diabetes Mother   . Cancer Mother     breast  . Cancer Sister     breast  . Cancer Sister     breast   No Known Allergies Current Outpatient Prescriptions on File Prior to Visit  Medication Sig Dispense Refill  . ALPRAZolam (XANAX) 0.5 MG tablet Take 0.5 mg by mouth 2 (two) times daily as needed.        . Multiple Vitamin (MULTIVITAMIN) tablet Take 1 tablet by mouth daily.        . busPIRone (BUSPAR) 15 MG tablet Take 1/2 by mouth two times a day for anxiety as needed      . cyclobenzaprine (FLEXERIL) 5 MG tablet Take one tablet by mouth three times daily as needed for back pain (caution sedation)  60 tablet  0     Review of Systems Review of Systems  Constitutional: Negative for fever, appetite change, fatigue and unexpected weight change.  Eyes: Negative for pain and visual disturbance.  Respiratory: Negative for cough and shortness of breath.     Cardiovascular: Negative for cp or palpitations    Gastrointestinal: Negative for nausea, diarrhea and constipation.  Genitourinary: Negative for urgency and frequency.  Skin: Negative for pallor or rash   Neurological: Negative for weakness, light-headedness, numbness and headaches.  Hematological: Negative for adenopathy. Does not bruise/bleed easily.  Psychiatric/Behavioral: Negative for dysphoric mood. The patient is not nervous/anxious.         Objective:   Physical Exam  Constitutional: She appears well-developed and well-nourished. No distress.       overwt and well appearing   HENT:  Head: Normocephalic and atraumatic.  Right Ear: External ear normal.  Left Ear: External ear normal.  Nose: Nose normal.  Mouth/Throat: Oropharynx is clear and moist.  Eyes: Conjunctivae normal and EOM are normal. Pupils are equal, round, and reactive to light. No scleral icterus.  Neck: Normal range of motion. Neck supple. No JVD present. Carotid bruit is not present. No thyromegaly present.  Cardiovascular: Normal rate, regular rhythm, normal heart sounds and intact distal pulses.  Exam reveals no gallop.   Pulmonary/Chest: Effort normal and breath sounds normal. No respiratory distress. She has no wheezes.  Abdominal: Soft. Bowel sounds are normal. She exhibits no distension, no abdominal bruit and no mass. There is no tenderness.  Musculoskeletal: Normal range of motion. She exhibits no edema and no tenderness.  Lymphadenopathy:    She has no cervical adenopathy.  Neurological: She is alert. She has normal reflexes. No cranial nerve deficit. She exhibits normal muscle tone. Coordination normal.  Skin: Skin is warm and dry. No rash noted. No erythema. No pallor.  Psychiatric: She has a normal mood and affect.          Assessment & Plan:

## 2012-03-10 ENCOUNTER — Telehealth: Payer: Self-pay | Admitting: Family Medicine

## 2012-03-10 NOTE — Telephone Encounter (Signed)
Please call and check in with her at the end of the day to see how she is feeling If not improved in the next few days, please have her f/u thanks

## 2012-03-10 NOTE — Telephone Encounter (Signed)
Advise pt if she is not feeling better in a few days make a f/u appt., pt said she is starting to feel a little better the more she lays down and rest

## 2012-03-10 NOTE — Telephone Encounter (Signed)
Caller: Jamie Shaffer; Patient Name: Jamie Shaffer; PCP: Roxy Manns [facility]); Best Callback Phone Number: [phone].  Pt. calling.  States she is feeling poorly since she rec'd flu immunization last week.  Afebrile/subjective.  LMP- hysterectomy.   Caller reports feeling weak, feels funny in chest when taking deep breath.  Cough - "feels like bronchitis".  Clear sputum.   Fatigue, mild weakness.  Emergent symptoms ruled out.  Home care and parameters for callback per Cough protocol for  "new onset of two or more of the following symptoms:  nasal congestion with runny nose, sneezing, itchy or mild sore throat, mild headache or body aches; mild fatigue, low grade fever  up to 101.5 F usually lasting about a week."

## 2012-04-08 ENCOUNTER — Other Ambulatory Visit: Payer: 59

## 2012-04-14 ENCOUNTER — Encounter: Payer: 59 | Admitting: Family Medicine

## 2012-06-26 ENCOUNTER — Other Ambulatory Visit: Payer: Self-pay | Admitting: Obstetrics and Gynecology

## 2012-06-26 DIAGNOSIS — Z1231 Encounter for screening mammogram for malignant neoplasm of breast: Secondary | ICD-10-CM

## 2012-07-09 ENCOUNTER — Ambulatory Visit (INDEPENDENT_AMBULATORY_CARE_PROVIDER_SITE_OTHER): Payer: 59 | Admitting: Surgery

## 2012-07-09 ENCOUNTER — Encounter (INDEPENDENT_AMBULATORY_CARE_PROVIDER_SITE_OTHER): Payer: 59 | Admitting: Surgery

## 2012-07-09 ENCOUNTER — Encounter (INDEPENDENT_AMBULATORY_CARE_PROVIDER_SITE_OTHER): Payer: Self-pay | Admitting: Surgery

## 2012-07-09 VITALS — BP 150/76 | HR 88 | Resp 20 | Ht 63.0 in | Wt 167.8 lb

## 2012-07-09 DIAGNOSIS — L0591 Pilonidal cyst without abscess: Secondary | ICD-10-CM

## 2012-07-09 MED ORDER — DOXYCYCLINE HYCLATE 100 MG PO CAPS: 100.0000 mg | ORAL_CAPSULE | Freq: Two times a day (BID) | ORAL | Status: DC

## 2012-07-09 MED ORDER — HYDROCODONE-ACETAMINOPHEN 5-325 MG PO TABS: 1.0000 | ORAL_TABLET | ORAL | Status: DC | PRN

## 2012-07-09 NOTE — Progress Notes (Signed)
Patient ID: Jamie Shaffer, female   DOB: 12-Jun-1961, 52 y.o.   MRN: [id number]  Chief Complaint  Patient presents with  . New Evaluation    eval abscess on back - painful and raised    HPI Jamie Shaffer is a 52 y.o. female.  Referred by Dr. Milinda Antis for evaluation of pilonidal cyst HPI This is a 52 year old female in good health who is status post a pilonidal cystectomy about 20 years ago. Intermittently she develops tenderness right alkaline phosphatase 6. She exercises regularly. Yesterday she was exercising and showed afterward started having intense pain near her coccyx. She comes in today for evaluation to rule out an abscess.  Past Medical History  Diagnosis Date  . Fibrocystic breast   . Panic disorder   . Post-menopausal     Past Surgical History  Procedure Date  . Pilonidal cyst excision   . Fibroid tumor   . Cesarean section   . Vaginal hysterectomy     partial    Family History  Problem Relation Age of Onset  . Hypertension Mother   . Diabetes Mother   . Cancer Mother     breast  . Cancer Sister     breast  . Cancer Sister     breast    Social History History  Substance Use Topics  . Smoking status: Never Smoker   . Smokeless tobacco: Never Used  . Alcohol Use: No    No Known Allergies  Current Outpatient Prescriptions  Medication Sig Dispense Refill  . ibuprofen (ADVIL,MOTRIN) 400 MG tablet Take 400 mg by mouth every 6 (six) hours as needed.      . doxycycline (VIBRAMYCIN) 100 MG capsule Take 1 capsule (100 mg total) by mouth 2 (two) times daily.  28 capsule  0  . HYDROcodone-acetaminophen (NORCO/VICODIN) 5-325 MG per tablet Take 1 tablet by mouth every 4 (four) hours as needed for pain.  40 tablet  0  . Multiple Vitamin (MULTIVITAMIN) tablet Take 1 tablet by mouth daily.          Review of Systems Review of Systems  Constitutional: Negative for fever, chills and unexpected weight change.  HENT: Negative for hearing loss, congestion,  sore throat, trouble swallowing and voice change.   Eyes: Negative for visual disturbance.  Respiratory: Negative for cough and wheezing.   Cardiovascular: Negative for chest pain, palpitations and leg swelling.  Gastrointestinal: Negative for nausea, vomiting, abdominal pain, diarrhea, constipation, blood in stool, abdominal distention and anal bleeding.  Genitourinary: Negative for hematuria, vaginal bleeding and difficulty urinating.  Musculoskeletal: Positive for back pain. Negative for arthralgias.  Skin: Negative for rash and wound.  Neurological: Negative for seizures, syncope and headaches.  Hematological: Negative for adenopathy. Does not bruise/bleed easily.  Psychiatric/Behavioral: Negative for confusion.    Blood pressure 150/76, pulse 88, resp. rate 20, height 5\' 3"  (1.6 m), weight 167 lb 12.8 oz (76.114 kg).  Physical Exam Physical Exam WDWN in NAD Examination of the skin the area around the coccyx shows no sign of inflammation or swelling. She has a healed scar in the midline. Mild subcutaneous firmness under the scar. No area of fluctuance. No erythema or induration noted. The patient is tender in this area but I cannot find anything to drain. No sign of abscess.  Data Reviewed none  Assessment    Pilonidal cyst s/p excision now with pain near coccyx    Plan    No obvious abscess.  Will start empiric Doxycycline - heating  pad/ warm compresses.  Follow-up 2 weeks.         Benen Weida K. 07/09/2012, 4:00 PM

## 2012-07-14 ENCOUNTER — Encounter (INDEPENDENT_AMBULATORY_CARE_PROVIDER_SITE_OTHER): Payer: 59 | Admitting: General Surgery

## 2012-07-15 ENCOUNTER — Ambulatory Visit: Payer: Self-pay

## 2012-07-23 ENCOUNTER — Encounter (INDEPENDENT_AMBULATORY_CARE_PROVIDER_SITE_OTHER): Payer: 59 | Admitting: Surgery

## 2012-07-25 ENCOUNTER — Ambulatory Visit
Admission: RE | Admit: 2012-07-25 | Discharge: 2012-07-25 | Disposition: A | Discharge status: Home / Self Care | Payer: 59 | Source: Ambulatory Visit | Attending: Obstetrics and Gynecology | Admitting: Obstetrics and Gynecology

## 2012-07-25 DIAGNOSIS — Z1231 Encounter for screening mammogram for malignant neoplasm of breast: Secondary | ICD-10-CM

## 2012-07-28 ENCOUNTER — Encounter: Payer: Self-pay | Admitting: *Deleted

## 2013-01-15 ENCOUNTER — Telehealth: Payer: Self-pay

## 2013-01-15 NOTE — Telephone Encounter (Signed)
I will see her then  

## 2013-01-15 NOTE — Telephone Encounter (Signed)
Pt feeling very tired; pt has worked part time for years; two weeks ago started full time job and working from early morning until 5:00 pm. Today pt does not feel as tired but pt has hx of anemia. Pt scheduled appt with Dr Milinda Antis 01/16/13 at 3:45 pm (only time pt could leave work). Advised pt if condition changes or worsens prior to appt pt will cb or go to UC if at night. Pt voiced understanding.

## 2013-01-16 ENCOUNTER — Encounter: Payer: Self-pay | Admitting: Family Medicine

## 2013-01-16 ENCOUNTER — Ambulatory Visit (INDEPENDENT_AMBULATORY_CARE_PROVIDER_SITE_OTHER): Payer: 59 | Admitting: Family Medicine

## 2013-01-16 VITALS — BP 112/82 | HR 62 | Temp 98.4°F | Wt 169.5 lb

## 2013-01-16 DIAGNOSIS — R5381 Other malaise: Secondary | ICD-10-CM

## 2013-01-16 DIAGNOSIS — N951 Menopausal and female climacteric states: Secondary | ICD-10-CM

## 2013-01-16 DIAGNOSIS — R5383 Other fatigue: Secondary | ICD-10-CM

## 2013-01-16 LAB — CBC WITH DIFFERENTIAL/PLATELET
Basophils Absolute: 0 10*3/uL (ref 0.0–0.1)
Eosinophils Relative: 2 % (ref 0–5)
Lymphocytes Relative: 51 % — ABNORMAL HIGH (ref 12–46)
Lymphs Abs: 2.6 10*3/uL (ref 0.7–4.0)
MCV: 85 fL (ref 78.0–100.0)
Neutro Abs: 1.9 10*3/uL (ref 1.7–7.7)
Neutrophils Relative %: 37 % — ABNORMAL LOW (ref 43–77)
Platelets: 326 10*3/uL (ref 150–400)
RBC: 4.41 MIL/uL (ref 3.87–5.11)
WBC: 5.1 10*3/uL (ref 4.0–10.5)

## 2013-01-16 LAB — COMPREHENSIVE METABOLIC PANEL
Albumin: 4.1 g/dL (ref 3.5–5.2)
Alkaline Phosphatase: 85 U/L (ref 39–117)
BUN: 14 mg/dL (ref 6–23)
CO2: 32 mEq/L (ref 19–32)
Glucose, Bld: 91 mg/dL (ref 70–99)
Potassium: 4.6 mEq/L (ref 3.5–5.3)

## 2013-01-16 NOTE — Patient Instructions (Addendum)
For fatigue - we are doing labs today We are also checking some labs for menopause  Try to get regular sleep and also drink lots of fluids and try to get regular exercise (work up to 30 minutes five days per week)  Eat a healthy diet  Stay away from caffeine during the day / stay away from a lot of sugar also

## 2013-01-16 NOTE — Progress Notes (Signed)
Subjective:    Patient ID: Jamie Shaffer, female    DOB: 10/07/1960, 52 y.o.   MRN: [id number]  HPI Here today for fatigue   Got a full time job 2 wk ago - 8 hour days (and a smaller part time job) , also takes care of her grandchild at night  She is sleeping well - 9:30-10   Wakes up at 6    (she does wake up with hot flashes)   Not depressed -her mood is ok  No major stress other than getting used to a new job  No air conditioning at work - gets overheated   She is in menopausal - hyst feb 99 , partial   She is anxious however about flight upcoming   She has been anemic for years  She takes a vegetarian iron liquid supplement bid (10 mg bid)    Patient Active Problem List   Diagnosis Date Noted  . Pilonidal cyst 07/09/2012  . Pre-employment drug testing 03/04/2012  . Routine general medical examination at a health care facility 02/04/2011  . ANEMIA 11/14/2007  . FIBROCYSTIC BREAST DISEASE 10/20/2007  . PANIC DISORDER 10/17/2007   Past Medical History  Diagnosis Date  . Fibrocystic breast   . Panic disorder   . Post-menopausal    Past Surgical History  Procedure Laterality Date  . Pilonidal cyst excision    . Fibroid tumor    . Cesarean section    . Vaginal hysterectomy      partial   History  Substance Use Topics  . Smoking status: Never Smoker   . Smokeless tobacco: Never Used  . Alcohol Use: No   Family History  Problem Relation Age of Onset  . Hypertension Mother   . Diabetes Mother   . Cancer Mother     breast  . Cancer Sister     breast  . Cancer Sister     breast   No Known Allergies Current Outpatient Prescriptions on File Prior to Visit  Medication Sig Dispense Refill  . ibuprofen (ADVIL,MOTRIN) 400 MG tablet Take 400 mg by mouth every 6 (six) hours as needed.      . Multiple Vitamin (MULTIVITAMIN) tablet Take 1 tablet by mouth daily.        Marland Kitchen HYDROcodone-acetaminophen (NORCO/VICODIN) 5-325 MG per tablet Take 1 tablet by mouth every 4  (four) hours as needed for pain.  40 tablet  0   No current facility-administered medications on file prior to visit.     Review of Systems Review of Systems  Constitutional: Negative for fever, appetite change, and unexpected weight change. pos for fatigue  Eyes: Negative for pain and visual disturbance.  Respiratory: Negative for cough and shortness of breath.   Cardiovascular: Negative for cp or palpitations  Neg for pedal edema or PND/ orthopnea   Gastrointestinal: Negative for nausea, diarrhea and constipation.  Genitourinary: Negative for urgency and frequency.  Skin: Negative for pallor or rash   Neurological: Negative for weakness, light-headedness, numbness and headaches.  Hematological: Negative for adenopathy. Does not bruise/bleed easily.  Psychiatric/Behavioral: Negative for dysphoric mood. Pos for mild anxiety about upcoming airplane flight.         Objective:   Physical Exam  Constitutional: She appears well-developed and well-nourished. No distress.  HENT:  Head: Normocephalic and atraumatic.  Mouth/Throat: Oropharynx is clear and moist.  Eyes: Conjunctivae and EOM are normal. Pupils are equal, round, and reactive to light. Right eye exhibits no discharge. Left eye exhibits no  discharge. No scleral icterus.  Neck: Normal range of motion. Neck supple. No JVD present. Carotid bruit is not present. No thyromegaly present.  Cardiovascular: Normal rate, regular rhythm, normal heart sounds and intact distal pulses.  Exam reveals no gallop.   Pulmonary/Chest: Effort normal and breath sounds normal. No respiratory distress. She has no wheezes. She has no rales.  No crackles   Abdominal: Soft. Bowel sounds are normal. She exhibits no distension, no abdominal bruit and no mass. There is no tenderness.  Musculoskeletal: She exhibits no edema.  Lymphadenopathy:    She has no cervical adenopathy.  Neurological: She is alert. She has normal reflexes. No cranial nerve deficit. She  exhibits normal muscle tone. Coordination normal.  Skin: Skin is warm and dry. No rash noted. No erythema. No pallor.  Psychiatric: She has a normal mood and affect.          Assessment & Plan:

## 2013-01-17 LAB — VITAMIN B12: Vitamin B-12: 926 pg/mL — ABNORMAL HIGH (ref 211–911)

## 2013-01-17 LAB — TSH: TSH: 1.212 u[IU]/mL (ref 0.350–4.500)

## 2013-01-17 LAB — FOLLICLE STIMULATING HORMONE: FSH: 120.3 m[IU]/mL — ABNORMAL HIGH

## 2013-01-17 LAB — LUTEINIZING HORMONE: LH: 59.7 m[IU]/mL

## 2013-01-17 LAB — FERRITIN: Ferritin: 118 ng/mL (ref 10–291)

## 2013-01-18 NOTE — Assessment & Plan Note (Signed)
Suspect menopause is playing a role along with extremely busy schedule  Mood is ok  Labs today Disc imp of regular sleep and diet and exercise

## 2013-01-18 NOTE — Assessment & Plan Note (Signed)
Disc opt for tx - will not go on HRT unless symptoms get much worse  Mood is ok  Disc fatigue and other symptoms that are common/ expectations

## 2013-01-19 ENCOUNTER — Encounter: Payer: Self-pay | Admitting: *Deleted

## 2013-03-06 ENCOUNTER — Telehealth: Payer: Self-pay

## 2013-03-06 NOTE — Telephone Encounter (Signed)
Pt called back and explained that labs have to be released before they are viewable. Pt request cb when can see lab results in mychart.

## 2013-03-06 NOTE — Telephone Encounter (Signed)
Pt left v/m requesting code to view latest lab results (01/16/13). Left v/m for pt to cb. Dr Milinda Antis can the lab results for 01/16/13 be released so viewable by pt? Please advise.

## 2013-03-06 NOTE — Telephone Encounter (Signed)
I think I released them now- let me know if any problems

## 2013-03-09 NOTE — Telephone Encounter (Signed)
Left voicemail letting pt know labs were released on mychart

## 2013-06-24 ENCOUNTER — Other Ambulatory Visit (HOSPITAL_COMMUNITY): Payer: Self-pay | Admitting: Obstetrics and Gynecology

## 2013-06-24 DIAGNOSIS — Z1231 Encounter for screening mammogram for malignant neoplasm of breast: Secondary | ICD-10-CM

## 2013-07-27 ENCOUNTER — Ambulatory Visit: Payer: 59

## 2013-07-27 ENCOUNTER — Ambulatory Visit (HOSPITAL_COMMUNITY): Payer: 59

## 2013-09-01 ENCOUNTER — Telehealth: Payer: Self-pay | Admitting: Family Medicine

## 2013-09-01 ENCOUNTER — Other Ambulatory Visit (INDEPENDENT_AMBULATORY_CARE_PROVIDER_SITE_OTHER): Payer: No Typology Code available for payment source

## 2013-09-01 DIAGNOSIS — Z0000 Encounter for general adult medical examination without abnormal findings: Secondary | ICD-10-CM

## 2013-09-01 LAB — COMPREHENSIVE METABOLIC PANEL
ALBUMIN: 3.9 g/dL (ref 3.5–5.2)
ALK PHOS: 57 U/L (ref 39–117)
ALT: 20 U/L (ref 0–35)
AST: 18 U/L (ref 0–37)
BILIRUBIN TOTAL: 0.5 mg/dL (ref 0.3–1.2)
BUN: 15 mg/dL (ref 6–23)
CO2: 30 meq/L (ref 19–32)
Calcium: 9.5 mg/dL (ref 8.4–10.5)
Chloride: 103 mEq/L (ref 96–112)
Creatinine, Ser: 0.7 mg/dL (ref 0.4–1.2)
GFR: 108.4 mL/min (ref >60.00)
Glucose, Bld: 89 mg/dL (ref 70–99)
Potassium: 4.2 mEq/L (ref 3.5–5.1)
Sodium: 139 mEq/L (ref 135–145)
TOTAL PROTEIN: 7.5 g/dL (ref 6.0–8.3)

## 2013-09-01 LAB — LIPID PANEL
Cholesterol: 207 mg/dL — ABNORMAL HIGH (ref 0–200)
HDL: 84.8 mg/dL (ref >39.00)
LDL CALC: 111 mg/dL — AB (ref 0–99)
TRIGLYCERIDES: 55 mg/dL (ref 0.0–149.0)
Total CHOL/HDL Ratio: 2
VLDL: 11 mg/dL (ref 0.0–40.0)

## 2013-09-01 LAB — CBC WITH DIFFERENTIAL/PLATELET
BASOS PCT: 0.6 % (ref 0.0–3.0)
Basophils Absolute: 0 10*3/uL (ref 0.0–0.1)
EOS PCT: 1.9 % (ref 0.0–5.0)
Eosinophils Absolute: 0.1 10*3/uL (ref 0.0–0.7)
HCT: 37.2 % (ref 36.0–46.0)
HEMOGLOBIN: 11.8 g/dL — AB (ref 12.0–15.0)
LYMPHS ABS: 2 10*3/uL (ref 0.7–4.0)
Lymphocytes Relative: 45.6 % (ref 12.0–46.0)
MCHC: 31.7 g/dL (ref 30.0–36.0)
MCV: 85.3 fl (ref 78.0–100.0)
MONO ABS: 0.4 10*3/uL (ref 0.1–1.0)
MONOS PCT: 8.4 % (ref 3.0–12.0)
Neutro Abs: 1.9 10*3/uL (ref 1.4–7.7)
Neutrophils Relative %: 43.5 % (ref 43.0–77.0)
Platelets: 317 10*3/uL (ref 150.0–400.0)
RBC: 4.36 Mil/uL (ref 3.87–5.11)
RDW: 14.1 % (ref 11.5–14.6)
WBC: 4.4 10*3/uL — AB (ref 4.5–10.5)

## 2013-09-01 LAB — TSH: TSH: 0.65 u[IU]/mL (ref 0.35–5.50)

## 2013-09-01 NOTE — Telephone Encounter (Signed)
PE labs  

## 2013-09-07 ENCOUNTER — Encounter: Payer: 59 | Admitting: Family Medicine

## 2013-09-09 ENCOUNTER — Encounter: Payer: Self-pay | Admitting: Family Medicine

## 2013-09-09 ENCOUNTER — Ambulatory Visit (INDEPENDENT_AMBULATORY_CARE_PROVIDER_SITE_OTHER): Payer: No Typology Code available for payment source | Admitting: Family Medicine

## 2013-09-09 VITALS — BP 110/70 | HR 74 | Temp 98.4°F | Ht 62.6 in | Wt 153.5 lb

## 2013-09-09 DIAGNOSIS — Z0000 Encounter for general adult medical examination without abnormal findings: Secondary | ICD-10-CM

## 2013-09-09 NOTE — Progress Notes (Signed)
Subjective:    Patient ID: Jamie Shaffer, female    DOB: 05-17-1961, 53 y.o.   MRN: [id number]  HPI Here for health maintenance exam and to review chronic medical problems    Wt is down 16 lb with bmi of 27 She lost it with diet and exercise Drinking more water  Exercise every day - walking  Feels better too   Pap nl with gyn march 1  Had partial hysterectomy Saw her gyn recently - she had some pelvic pain and she had [place] of ovaries - and that was normal-very reassuring   Mammogram 2/14 -has that scheduled on Tuesday upcoming-will get a 3 D mammogram Strong family hx  Self exam-no lumps or changes  Gyn did not feel lumps either   colonsc 6/12 nl  Has long hx of mild anemia   Flu vaccine -got that in the fall   Td 8/12   Mood - staying motivated -good for the most part  Some trouble sleeping at times -occ takes sominex   Results for orders placed in visit on 09/01/13  CBC WITH DIFFERENTIAL      Result Value Ref Range   WBC 4.4 (*) 4.5 - 10.5 K/uL   RBC 4.36  3.87 - 5.11 Mil/uL   Hemoglobin 11.8 (*) 12.0 - 15.0 g/dL   HCT 37.2  36.0 - 46.0 %   MCV 85.3  78.0 - 100.0 fl   MCHC 31.7  30.0 - 36.0 g/dL   RDW 14.1  11.5 - 14.6 %   Platelets 317.0  150.0 - 400.0 K/uL   Neutrophils Relative % 43.5  43.0 - 77.0 %   Lymphocytes Relative 45.6  12.0 - 46.0 %   Monocytes Relative 8.4  3.0 - 12.0 %   Eosinophils Relative 1.9  0.0 - 5.0 %   Basophils Relative 0.6  0.0 - 3.0 %   Neutro Abs 1.9  1.4 - 7.7 K/uL   Lymphs Abs 2.0  0.7 - 4.0 K/uL   Monocytes Absolute 0.4  0.1 - 1.0 K/uL   Eosinophils Absolute 0.1  0.0 - 0.7 K/uL   Basophils Absolute 0.0  0.0 - 0.1 K/uL  COMPREHENSIVE METABOLIC PANEL      Result Value Ref Range   Sodium 139  135 - 145 mEq/L   Potassium 4.2  3.5 - 5.1 mEq/L   Chloride 103  96 - 112 mEq/L   CO2 30  19 - 32 mEq/L   Glucose, Bld 89  70 - 99 mg/dL   BUN 15  6 - 23 mg/dL   Creatinine, Ser 0.7  0.4 - 1.2 mg/dL   Total Bilirubin 0.5  0.3 - 1.2  mg/dL   Alkaline Phosphatase 57  39 - 117 U/L   AST 18  0 - 37 U/L   ALT 20  0 - 35 U/L   Total Protein 7.5  6.0 - 8.3 g/dL   Albumin 3.9  3.5 - 5.2 g/dL   Calcium 9.5  8.4 - 10.5 mg/dL   GFR 108.40  >60.00 mL/min  LIPID PANEL      Result Value Ref Range   Cholesterol 207 (*) 0 - 200 mg/dL   Triglycerides 55.0  0.0 - 149.0 mg/dL   HDL 84.80  >39.00 mg/dL   VLDL 11.0  0.0 - 40.0 mg/dL   LDL Cholesterol 111 (*) 0 - 99 mg/dL   Total CHOL/HDL Ratio 2    TSH      Result Value Ref Range  TSH 0.65  0.35 - 5.50 uIU/mL     Patient Active Problem List   Diagnosis Date Noted  . Other malaise and fatigue 01/16/2013  . Menopausal symptoms 01/16/2013  . Pilonidal cyst 07/09/2012  . Pre-employment drug testing 03/04/2012  . Routine general medical examination at a health care facility 02/04/2011  . ANEMIA 11/14/2007  . FIBROCYSTIC BREAST DISEASE 10/20/2007  . PANIC DISORDER 10/17/2007   Past Medical History  Diagnosis Date  . Fibrocystic breast   . Panic disorder   . Post-menopausal    Past Surgical History  Procedure Laterality Date  . Pilonidal cyst excision    . Fibroid tumor    . Cesarean section    . Vaginal hysterectomy      partial   History  Substance Use Topics  . Smoking status: Never Smoker   . Smokeless tobacco: Never Used  . Alcohol Use: No   Family History  Problem Relation Age of Onset  . Hypertension Mother   . Diabetes Mother   . Cancer Mother     breast  . Cancer Sister     breast  . Cancer Sister     breast   No Known Allergies Current Outpatient Prescriptions on File Prior to Visit  Medication Sig Dispense Refill  . Multiple Vitamin (MULTIVITAMIN) tablet Take 1 tablet by mouth daily.         No current facility-administered medications on file prior to visit.    Review of Systems    Review of Systems  Constitutional: Negative for fever, appetite change, fatigue and unexpected weight change.  Eyes: Negative for pain and visual  disturbance.  ENT pos for a knot behind R ear where she thinks she hit her head prior  Respiratory: Negative for cough and shortness of breath.   Cardiovascular: Negative for cp or palpitations    Gastrointestinal: Negative for nausea, diarrhea and constipation. pos for pelvic/ ? Hip pain occas Genitourinary: Negative for urgency and frequency.  Skin: Negative for pallor or rash   Neurological: Negative for weakness, light-headedness, numbness and headaches.  Hematological: Negative for adenopathy. Does not bruise/bleed easily.  Psychiatric/Behavioral: Negative for dysphoric mood. The patient is not nervous/anxious.      Objective:   Physical Exam  Constitutional: She appears well-developed and well-nourished. No distress.  HENT:  Head: Normocephalic and atraumatic.  Right Ear: External ear normal.  Left Ear: External ear normal.  Nose: Nose normal.  Mouth/Throat: Oropharynx is clear and moist.  Knot noted behind R ear/occipital -tender and less than 1 cm  Eyes: Conjunctivae and EOM are normal. Pupils are equal, round, and reactive to light. Right eye exhibits no discharge. Left eye exhibits no discharge. No scleral icterus.  Neck: Normal range of motion. Neck supple. No JVD present. Carotid bruit is not present. No thyromegaly present.  Cardiovascular: Normal rate, regular rhythm, normal heart sounds and intact distal pulses.  Exam reveals no gallop.   Pulmonary/Chest: Effort normal and breath sounds normal. No respiratory distress. She has no wheezes. She has no rales.  Abdominal: Soft. Bowel sounds are normal. She exhibits no distension, no abdominal bruit and no mass. There is no tenderness.  No suprapubic tenderness or fullness    Musculoskeletal: She exhibits no edema and no tenderness.  Nl rom hips with minimal groin pain    Lymphadenopathy:    She has no cervical adenopathy.  Neurological: She is alert. She has normal reflexes. No cranial nerve deficit. She exhibits normal  muscle tone. Coordination  normal.  Skin: Skin is warm and dry. No rash noted. No erythema. No pallor.  Psychiatric: She has a normal mood and affect.          Assessment & Plan:

## 2013-09-09 NOTE — Progress Notes (Signed)
Pre visit review using our clinic review tool, if applicable. No additional management support is needed unless otherwise documented below in the visit note. 

## 2013-09-09 NOTE — Patient Instructions (Signed)
Keep up the great job with healthy diet and exercise  Take care of yourself  Labs look ok  Your mild anemia is stable

## 2013-09-10 NOTE — Assessment & Plan Note (Signed)
Reviewed health habits including diet and exercise and skin cancer prevention Reviewed appropriate screening tests for age  Also reviewed health mt list, fam hx and immunization status , as well as social and family history   Labs reviewed  utd gyn care  Mammogram upcoming soon

## 2013-09-14 ENCOUNTER — Ambulatory Visit
Admission: RE | Admit: 2013-09-14 | Discharge: 2013-09-14 | Disposition: A | Discharge status: Home / Self Care | Payer: No Typology Code available for payment source | Source: Ambulatory Visit | Attending: Obstetrics and Gynecology | Admitting: Obstetrics and Gynecology

## 2013-09-14 DIAGNOSIS — Z1231 Encounter for screening mammogram for malignant neoplasm of breast: Secondary | ICD-10-CM

## 2013-09-17 ENCOUNTER — Encounter: Payer: Self-pay | Admitting: *Deleted

## 2013-09-25 ENCOUNTER — Telehealth: Payer: Self-pay

## 2013-09-25 NOTE — Telephone Encounter (Signed)
Pt received letter about mammogram results; pt very concerned about part of letter that states pt's breast tissue is heterogeneously dense, which may obscure small masses. Pt's mother and sister has hx of breast problems and now pt is afraid because pt said she knows she has dense breast and could masses be missed; pt wants to know if needs MRI done. I advised pt that Dr Alba Cory response to the mammogram report was the mammogram was normal and repeat screening mammogram in one year. Pt advised Dr Glori Bickers would be back next week and pt said she would try not to worry since she had spoken with me but pt is still concerned. Pt request cb.

## 2013-09-26 NOTE — Telephone Encounter (Signed)
This is stated on many reports - with dense breasts it is harder to image - but we do not do anything further unless she feels a lump  MRI is only done when we are classifying a tumor that was already found  Keep doing your self breast exams

## 2013-09-29 NOTE — Telephone Encounter (Signed)
Pt notified of Dr. Tower's comments and verbalized understanding  

## 2014-01-26 ENCOUNTER — Telehealth: Payer: Self-pay | Admitting: Family Medicine

## 2014-01-26 MED ORDER — ALPRAZOLAM 0.5 MG PO TABS: ORAL_TABLET | ORAL | Status: DC

## 2014-01-26 NOTE — Telephone Encounter (Signed)
Pt called requesting some sort of anxiety medication be call in. Pt is traveling next week and gets really nervous about flying. Pt states she only needs 2 pills--one for going and one for coming back. [facility].

## 2014-01-26 NOTE — Telephone Encounter (Signed)
Px written for call in   Use caution

## 2014-01-26 NOTE — Telephone Encounter (Signed)
Rx call in as prescribed and pt notified and advise to use with caution

## 2014-03-29 ENCOUNTER — Encounter: Payer: Self-pay | Admitting: Family Medicine

## 2014-08-16 ENCOUNTER — Other Ambulatory Visit: Payer: Self-pay

## 2014-08-16 DIAGNOSIS — Z1231 Encounter for screening mammogram for malignant neoplasm of breast: Secondary | ICD-10-CM

## 2014-09-02 ENCOUNTER — Telehealth: Payer: Self-pay | Admitting: Family Medicine

## 2014-09-02 DIAGNOSIS — Z0000 Encounter for general adult medical examination without abnormal findings: Secondary | ICD-10-CM

## 2014-09-02 NOTE — Telephone Encounter (Signed)
-----   Message from Ellamae Sia sent at 08/27/2014 12:30 PM EST ----- Regarding: Lab orders for Friday, 3.18.16 Patient is scheduled for CPX labs, please order future labs, Thanks , Karna Christmas

## 2014-09-03 ENCOUNTER — Other Ambulatory Visit (INDEPENDENT_AMBULATORY_CARE_PROVIDER_SITE_OTHER): Payer: No Typology Code available for payment source

## 2014-09-03 DIAGNOSIS — Z0000 Encounter for general adult medical examination without abnormal findings: Secondary | ICD-10-CM

## 2014-09-03 LAB — COMPREHENSIVE METABOLIC PANEL
ALT: 23 U/L (ref 0–35)
AST: 21 U/L (ref 0–37)
Albumin: 3.8 g/dL (ref 3.5–5.2)
Alkaline Phosphatase: 83 U/L (ref 39–117)
BUN: 20 mg/dL (ref 6–23)
CHLORIDE: 104 meq/L (ref 96–112)
CO2: 30 mEq/L (ref 19–32)
Calcium: 9.4 mg/dL (ref 8.4–10.5)
Creatinine, Ser: 0.74 mg/dL (ref 0.40–1.20)
GFR: 104.63 mL/min (ref >60.00)
Glucose, Bld: 94 mg/dL (ref 70–99)
Potassium: 4.3 mEq/L (ref 3.5–5.1)
SODIUM: 137 meq/L (ref 135–145)
Total Bilirubin: 0.4 mg/dL (ref 0.2–1.2)
Total Protein: 7.2 g/dL (ref 6.0–8.3)

## 2014-09-03 LAB — CBC WITH DIFFERENTIAL/PLATELET
BASOS ABS: 0 10*3/uL (ref 0.0–0.1)
Basophils Relative: 0.7 % (ref 0.0–3.0)
Eosinophils Absolute: 0.1 10*3/uL (ref 0.0–0.7)
Eosinophils Relative: 2 % (ref 0.0–5.0)
HCT: 36.8 % (ref 36.0–46.0)
HEMOGLOBIN: 12.1 g/dL (ref 12.0–15.0)
LYMPHS ABS: 2.1 10*3/uL (ref 0.7–4.0)
Lymphocytes Relative: 45.1 % (ref 12.0–46.0)
MCHC: 32.8 g/dL (ref 30.0–36.0)
MCV: 83 fl (ref 78.0–100.0)
MONOS PCT: 9.8 % (ref 3.0–12.0)
Monocytes Absolute: 0.5 10*3/uL (ref 0.1–1.0)
Neutro Abs: 2 10*3/uL (ref 1.4–7.7)
Neutrophils Relative %: 42.4 % — ABNORMAL LOW (ref 43.0–77.0)
Platelets: 309 10*3/uL (ref 150.0–400.0)
RBC: 4.43 Mil/uL (ref 3.87–5.11)
RDW: 14.5 % (ref 11.5–15.5)
WBC: 4.7 10*3/uL (ref 4.0–10.5)

## 2014-09-03 LAB — LIPID PANEL
CHOL/HDL RATIO: 3
Cholesterol: 203 mg/dL — ABNORMAL HIGH (ref 0–200)
HDL: 80.7 mg/dL (ref >39.00)
LDL Cholesterol: 111 mg/dL — ABNORMAL HIGH (ref 0–99)
NONHDL: 122.3
TRIGLYCERIDES: 57 mg/dL (ref 0.0–149.0)
VLDL: 11.4 mg/dL (ref 0.0–40.0)

## 2014-09-03 LAB — TSH: TSH: 1.13 u[IU]/mL (ref 0.35–4.50)

## 2014-09-14 ENCOUNTER — Encounter: Payer: No Typology Code available for payment source | Admitting: Family Medicine

## 2014-09-16 ENCOUNTER — Ambulatory Visit
Admission: RE | Admit: 2014-09-16 | Discharge: 2014-09-16 | Disposition: A | Discharge status: Home / Self Care | Payer: No Typology Code available for payment source | Source: Ambulatory Visit

## 2014-09-16 DIAGNOSIS — Z1231 Encounter for screening mammogram for malignant neoplasm of breast: Secondary | ICD-10-CM

## 2014-09-20 ENCOUNTER — Encounter: Payer: Self-pay | Admitting: Family Medicine

## 2014-09-20 ENCOUNTER — Ambulatory Visit (INDEPENDENT_AMBULATORY_CARE_PROVIDER_SITE_OTHER): Payer: No Typology Code available for payment source | Admitting: Family Medicine

## 2014-09-20 VITALS — BP 122/76 | HR 68 | Temp 98.1°F | Ht 63.0 in | Wt 172.8 lb

## 2014-09-20 DIAGNOSIS — Z0000 Encounter for general adult medical examination without abnormal findings: Secondary | ICD-10-CM

## 2014-09-20 DIAGNOSIS — E669 Obesity, unspecified: Secondary | ICD-10-CM | POA: Diagnosis not present

## 2014-09-20 DIAGNOSIS — R0789 Other chest pain: Secondary | ICD-10-CM

## 2014-09-20 DIAGNOSIS — K219 Gastro-esophageal reflux disease without esophagitis: Secondary | ICD-10-CM | POA: Diagnosis not present

## 2014-09-20 DIAGNOSIS — R079 Chest pain, unspecified: Secondary | ICD-10-CM | POA: Insufficient documentation

## 2014-09-20 NOTE — Assessment & Plan Note (Signed)
Re assuring EKG rate 58 with short PR  No acute changes  Atypical  Has GI symptoms  Will tx gerd and obs -inst however to seek care if symptoms change or worsen

## 2014-09-20 NOTE — Progress Notes (Signed)
Subjective:    Patient ID: Jamie Shaffer, female    DOB: 05-Mar-1961, 54 y.o.   MRN: [id number]  HPI Here for health maintenance exam and to review chronic medical problems    Is doing well overall  Working a lot   Some times she has chest pain that rad to her jaw - gets better after she coughs  Not exertional  Happens at rest  Worse with spicy foods    Wt is up 20 lb with bmi of 30  Does work out 4 days per week - an hour at the gym  Thinks menopause caused wt gain  She has tried to count calories    Flu vaccine - did have it in oct Mammogram 3/16- normal  Self exam-no lumps   Pap 3/15 -normal  No gyn symptoms  No bleeding   colonosc 6/12 - had some ext hemorrhoids  Colon cancer in GF and 2 aunts and uncle  5 year recall   Td 8/12   Results for orders placed or performed in visit on 09/03/14  CBC with Differential/Platelet  Result Value Ref Range   WBC 4.7 4.0 - 10.5 K/uL   RBC 4.43 3.87 - 5.11 Mil/uL   Hemoglobin 12.1 12.0 - 15.0 g/dL   HCT 36.8 36.0 - 46.0 %   MCV 83.0 78.0 - 100.0 fl   MCHC 32.8 30.0 - 36.0 g/dL   RDW 14.5 11.5 - 15.5 %   Platelets 309.0 150.0 - 400.0 K/uL   Neutrophils Relative % 42.4 (L) 43.0 - 77.0 %   Lymphocytes Relative 45.1 12.0 - 46.0 %   Monocytes Relative 9.8 3.0 - 12.0 %   Eosinophils Relative 2.0 0.0 - 5.0 %   Basophils Relative 0.7 0.0 - 3.0 %   Neutro Abs 2.0 1.4 - 7.7 K/uL   Lymphs Abs 2.1 0.7 - 4.0 K/uL   Monocytes Absolute 0.5 0.1 - 1.0 K/uL   Eosinophils Absolute 0.1 0.0 - 0.7 K/uL   Basophils Absolute 0.0 0.0 - 0.1 K/uL  Comprehensive metabolic panel  Result Value Ref Range   Sodium 137 135 - 145 mEq/L   Potassium 4.3 3.5 - 5.1 mEq/L   Chloride 104 96 - 112 mEq/L   CO2 30 19 - 32 mEq/L   Glucose, Bld 94 70 - 99 mg/dL   BUN 20 6 - 23 mg/dL   Creatinine, Ser 0.74 0.40 - 1.20 mg/dL   Total Bilirubin 0.4 0.2 - 1.2 mg/dL   Alkaline Phosphatase 83 39 - 117 U/L   AST 21 0 - 37 U/L   ALT 23 0 - 35 U/L   Total  Protein 7.2 6.0 - 8.3 g/dL   Albumin 3.8 3.5 - 5.2 g/dL   Calcium 9.4 8.4 - 10.5 mg/dL   GFR 104.63 >60.00 mL/min  Lipid panel  Result Value Ref Range   Cholesterol 203 (H) 0 - 200 mg/dL   Triglycerides 57.0 0.0 - 149.0 mg/dL   HDL 80.70 >39.00 mg/dL   VLDL 11.4 0.0 - 40.0 mg/dL   LDL Cholesterol 111 (H) 0 - 99 mg/dL   Total CHOL/HDL Ratio 3    NonHDL 122.30   TSH  Result Value Ref Range   TSH 1.13 0.35 - 4.50 uIU/mL     Patient Active Problem List   Diagnosis Date Noted  . Chest pain 09/20/2014  . Acid reflux 09/20/2014  . Obesity 09/20/2014  . Menopausal symptoms 01/16/2013  . Pilonidal cyst 07/09/2012  . Routine general  medical examination at a health care facility 02/04/2011  . FIBROCYSTIC BREAST DISEASE 10/20/2007  . PANIC DISORDER 10/17/2007   Past Medical History  Diagnosis Date  . Fibrocystic breast   . Panic disorder   . Post-menopausal    Past Surgical History  Procedure Laterality Date  . Pilonidal cyst excision    . Fibroid tumor    . Cesarean section    . Vaginal hysterectomy      partial   History  Substance Use Topics  . Smoking status: Never Smoker   . Smokeless tobacco: Never Used  . Alcohol Use: No   Family History  Problem Relation Age of Onset  . Hypertension Mother   . Diabetes Mother   . Cancer Mother     breast  . Cancer Sister     breast  . Cancer Sister     breast   No Known Allergies Current Outpatient Prescriptions on File Prior to Visit  Medication Sig Dispense Refill  . ALPRAZolam (XANAX) 0.5 MG tablet Take 1 by mouth for travel daily as needed 2 2 tablet 0  . Multiple Vitamin (MULTIVITAMIN) tablet Take 1 tablet by mouth daily.       No current facility-administered medications on file prior to visit.     Review of Systems    Review of Systems  Constitutional: Negative for fever, appetite change, fatigue and unexpected weight change.  Eyes: Negative for pain and visual disturbance.  Respiratory: Negative for cough  and shortness of breath.   Cardiovascular: Negative for palpitations   pos for non exertional cp  Gastrointestinal: Negative for nausea, diarrhea and constipation. pos for indigestion/ neg for dark stool or blood in stool  Genitourinary: Negative for urgency and frequency.  Skin: Negative for pallor or rash   Neurological: Negative for weakness, light-headedness, numbness and headaches.  Hematological: Negative for adenopathy. Does not bruise/bleed easily.  Psychiatric/Behavioral: Negative for dysphoric mood. The patient is not nervous/anxious.      Objective:   Physical Exam  Constitutional: She appears well-developed and well-nourished. No distress.  obese and well appearing   HENT:  Head: Normocephalic and atraumatic.  Right Ear: External ear normal.  Left Ear: External ear normal.  Nose: Nose normal.  Mouth/Throat: Oropharynx is clear and moist.  Eyes: Conjunctivae and EOM are normal. Pupils are equal, round, and reactive to light. Right eye exhibits no discharge. Left eye exhibits no discharge. No scleral icterus.  Neck: Normal range of motion. Neck supple. No JVD present. Carotid bruit is not present. No thyromegaly present.  Cardiovascular: Normal rate, regular rhythm, normal heart sounds and intact distal pulses.  Exam reveals no gallop.   Pulmonary/Chest: Effort normal and breath sounds normal. No respiratory distress. She has no wheezes. She has no rales. She exhibits no tenderness.  Abdominal: Soft. Bowel sounds are normal. She exhibits no distension and no mass. There is no tenderness.  Genitourinary:  Breast exam: No mass, nodules, thickening, tenderness, bulging, retraction, inflamation, nipple discharge or skin changes noted.  No axillary or clavicular LA.      Musculoskeletal: She exhibits no edema or tenderness.  Lymphadenopathy:    She has no cervical adenopathy.  Neurological: She is alert. She has normal reflexes. No cranial nerve deficit. She exhibits normal muscle  tone. Coordination normal.  Skin: Skin is warm and dry. No rash noted. No erythema. No pallor.  Psychiatric: She has a normal mood and affect.          Assessment & Plan:  Problem List Items Addressed This Visit      Digestive   Acid reflux    Causing chest discomfort relieved by coughing or belching -in setting of wt gain Trial of zantac 75 bid for 2 wk then prn Also diet handout given F/u if no improvement          Other   Chest pain    Re assuring EKG rate 58 with short PR  No acute changes  Atypical  Has GI symptoms  Will tx gerd and obs -inst however to seek care if symptoms change or worsen      Relevant Orders   EKG 12-Lead (Completed)   Obesity    Discussed how this problem influences overall health and the risks it imposes  Reviewed plan for weight loss with lower calorie diet (via better food choices and also portion control or program like weight watchers) and exercise building up to or more than 30 minutes 5 days per week including some aerobic activity         Routine general medical examination at a health care facility - Primary    Reviewed health habits including diet and exercise and skin cancer prevention Reviewed appropriate screening tests for age  Also reviewed health mt list, fam hx and immunization status , as well as social and family history   Labs rev  Enc further exercise and healthy eating

## 2014-09-20 NOTE — Progress Notes (Signed)
Pre visit review using our clinic review tool, if applicable. No additional management support is needed unless otherwise documented below in the visit note. 

## 2014-09-20 NOTE — Assessment & Plan Note (Signed)
Discussed how this problem influences overall health and the risks it imposes  Reviewed plan for weight loss with lower calorie diet (via better food choices and also portion control or program like weight watchers) and exercise building up to or more than 30 minutes 5 days per week including some aerobic activity    

## 2014-09-20 NOTE — Assessment & Plan Note (Signed)
Causing chest discomfort relieved by coughing or belching -in setting of wt gain Trial of zantac 75 bid for 2 wk then prn Also diet handout given F/u if no improvement

## 2014-09-20 NOTE — Assessment & Plan Note (Signed)
Reviewed health habits including diet and exercise and skin cancer prevention Reviewed appropriate screening tests for age  Also reviewed health mt list, fam hx and immunization status , as well as social and family history   Labs rev  Enc further exercise and healthy eating

## 2014-09-20 NOTE — Patient Instructions (Signed)
For weight loss- try a program like myfitnesspal or weight watchers to start logging in points or calories Keep up the good exercise  For acid reflux-watch diet and get zantac otc - take 75 mg twice daily for 2 weeks and then if helpful - just use as needed If this does not work- let me know  EKG is re assuring however if chest continues or changes or becomes exertional let me know   Labs are stable

## 2014-10-12 ENCOUNTER — Other Ambulatory Visit: Payer: Self-pay | Admitting: *Deleted

## 2014-10-12 MED ORDER — ALPRAZOLAM 0.5 MG PO TABS: 0.5000 mg | ORAL_TABLET | Freq: Every day | ORAL | Status: DC | PRN

## 2014-10-12 NOTE — Telephone Encounter (Signed)
Px written for call in   

## 2014-10-12 NOTE — Telephone Encounter (Signed)
Rx called in as prescribed and pt notified  

## 2014-10-12 NOTE — Telephone Encounter (Signed)
Patient is requesting a refill on her Xanax, last filled #2 01/26/14.  She states her brother is in Hospice with cancer and she needs something to deal with the anxiety from this.  Last OV 09/20/14.  Okay to refill?

## 2015-01-13 ENCOUNTER — Telehealth: Payer: Self-pay

## 2015-01-13 NOTE — Telephone Encounter (Signed)
Pt wanted to know if had thyroid test on 09/03/14 labs; advised pt yes and told pt TSH value; pt is going to go under mychart to copy and will cb if needed.

## 2015-01-17 ENCOUNTER — Other Ambulatory Visit: Payer: Self-pay | Admitting: Obstetrics and Gynecology

## 2015-01-18 LAB — CYTOLOGY - PAP

## 2015-01-24 ENCOUNTER — Telehealth: Payer: Self-pay | Admitting: Family Medicine

## 2015-01-24 NOTE — Telephone Encounter (Signed)
Cuticle infections can get very bad and turn into a surgical problem quickly so do not wait to come in if not improving  Clean well with antibacterial soap and water  Use antibiotic ointment otc (triple abx/ polysporin/etc)  F/u if no improvement

## 2015-01-24 NOTE — Telephone Encounter (Signed)
Pt notified of Dr. Marliss Coots instructions and verbalized understanding. Pt will try triple abx ointment but if finger looks worse tomorrow will call back and schedule appt

## 2015-01-24 NOTE — Telephone Encounter (Signed)
Pt has an index finger that is red around the cuticle and swollen and wanted a home remedy that can take care of it.She also stated that it is painful to touch and is throbbing.  I advised that an appt may be necessary but she wanted to speak by phone first,. Call back number is mobile number. Thanks.

## 2015-01-27 ENCOUNTER — Telehealth: Payer: Self-pay | Admitting: Family Medicine

## 2015-01-27 NOTE — Telephone Encounter (Signed)
Pt has either had an insect bit or ingrown hair and causing pt to have a headache. Pt request callback  cb number [phone]

## 2015-01-27 NOTE — Telephone Encounter (Signed)
Please make an appt with first available -- I don't know what to recommend for that

## 2015-01-27 NOTE — Telephone Encounter (Signed)
Appt made for 8/12 °

## 2015-01-28 ENCOUNTER — Ambulatory Visit: Payer: No Typology Code available for payment source | Admitting: Internal Medicine

## 2015-01-28 ENCOUNTER — Encounter: Payer: Self-pay | Admitting: Primary Care

## 2015-01-28 ENCOUNTER — Ambulatory Visit (INDEPENDENT_AMBULATORY_CARE_PROVIDER_SITE_OTHER): Payer: No Typology Code available for payment source | Admitting: Primary Care

## 2015-01-28 VITALS — BP 124/80 | HR 74 | Temp 97.9°F | Ht 63.0 in | Wt 168.4 lb

## 2015-01-28 DIAGNOSIS — L0291 Cutaneous abscess, unspecified: Secondary | ICD-10-CM

## 2015-01-28 MED ORDER — DOXYCYCLINE HYCLATE 100 MG PO TABS: 100.0000 mg | ORAL_TABLET | Freq: Two times a day (BID) | ORAL | Status: DC

## 2015-01-28 NOTE — Progress Notes (Signed)
Pre visit review using our clinic review tool, if applicable. No additional management support is needed unless otherwise documented below in the visit note. 

## 2015-01-28 NOTE — Progress Notes (Signed)
   Subjective:    Patient ID: Jamie Shaffer, female    DOB: 05-22-1961, 54 y.o.   MRN: [id number]  HPI  Jamie Shaffer is a 55 year old female who presents today with a chief complaint of abscess. Her abscess is located to her posterior neck and has been present for the past several weeks. She noticed her abscess enlarging and becoming tender upon palpation since Monday this week. She reports tenderness, denies itching. She took some ibuprofen with a decrease in pain. Denies fevers, but believes she had chills earlier this week.   Review of Systems  Constitutional: Positive for chills. Negative for fever.  Respiratory: Negative for shortness of breath.   Cardiovascular: Negative for chest pain.  Skin: Positive for wound.       Past Medical History  Diagnosis Date  . Fibrocystic breast   . Panic disorder   . Post-menopausal     Social History   Social History  . Marital Status: Married    Spouse Name: N/A  . Number of Children: 2  . Years of Education: N/A   Occupational History  . [employer] time    Social History Main Topics  . Smoking status: Never Smoker   . Smokeless tobacco: Never Used  . Alcohol Use: No  . Drug Use: No  . Sexual Activity: Not on file   Other Topics Concern  . Not on file   Social History Narrative   Exercise regularly at the gym    Past Surgical History  Procedure Laterality Date  . Pilonidal cyst excision    . Fibroid tumor    . Cesarean section    . Vaginal hysterectomy      partial    Family History  Problem Relation Age of Onset  . Hypertension Mother   . Diabetes Mother   . Cancer Mother     breast  . Cancer Sister     breast  . Cancer Sister     breast    No Known Allergies  Current Outpatient Prescriptions on File Prior to Visit  Medication Sig Dispense Refill  . ALPRAZolam (XANAX) 0.5 MG tablet Take 1 tablet (0.5 mg total) by mouth daily as needed for anxiety. Take 1 by mouth for travel  daily as needed 2 20 tablet 0  . Multiple Vitamin (MULTIVITAMIN) tablet Take 1 tablet by mouth daily.       No current facility-administered medications on file prior to visit.    BP 124/80 mmHg  Pulse 74  Temp(Src) 97.9 F (36.6 C) (Oral)  Ht 5\' 3"  (1.6 m)  Wt 168 lb 6.4 oz (76.386 kg)  BMI 29.84 kg/m2  SpO2 98%    Objective:   Physical Exam  Constitutional: She appears well-nourished.  Cardiovascular: Normal rate and regular rhythm.   Pulmonary/Chest: Effort normal and breath sounds normal.  Skin:  2.5 cm in diameter abscess present to posterior neck. Firm, non fluctuant. Erythema with tenderness.          Assessment & Plan:  Abscess:  Present to posterior neck x several weeks, worsening since Monday. Non fluctuant, redness, swelling, tender upon exam. No candidate for lancing. No fever, vitals stable. RX for Doxycycline BID x 14 days. Follow up PRN

## 2015-01-28 NOTE — Patient Instructions (Signed)
Start Doxycycline tablets for antibiotic for abscess. Take 1 tablet by mouth twice daily for 14 days.  You may apply warm compresses to the site three times daily for about 20 minute intervals.  Please call me if you've had no improvement after completions of antibiotics.  It was a pleasure meeting you!  Abscess An abscess is an infected area that contains a collection of pus and debris.It can occur in almost any part of the body. An abscess is also known as a furuncle or boil. CAUSES  An abscess occurs when tissue gets infected. This can occur from blockage of oil or sweat glands, infection of hair follicles, or a minor injury to the skin. As the body tries to fight the infection, pus collects in the area and creates pressure under the skin. This pressure causes pain. People with weakened immune systems have difficulty fighting infections and get certain abscesses more often.  SYMPTOMS Usually an abscess develops on the skin and becomes a painful mass that is red, warm, and tender. If the abscess forms under the skin, you may feel a moveable soft area under the skin. Some abscesses break open (rupture) on their own, but most will continue to get worse without care. The infection can spread deeper into the body and eventually into the bloodstream, causing you to feel ill.  DIAGNOSIS  Your caregiver will take your medical history and perform a physical exam. A sample of fluid may also be taken from the abscess to determine what is causing your infection. TREATMENT  Your caregiver may prescribe antibiotic medicines to fight the infection. However, taking antibiotics alone usually does not cure an abscess. Your caregiver may need to make a small cut (incision) in the abscess to drain the pus. In some cases, gauze is packed into the abscess to reduce pain and to continue draining the area. HOME CARE INSTRUCTIONS   Only take over-the-counter or prescription medicines for pain, discomfort, or fever as  directed by your caregiver.  If you were prescribed antibiotics, take them as directed. Finish them even if you start to feel better.  If gauze is used, follow your caregiver's directions for changing the gauze.  To avoid spreading the infection:  Keep your draining abscess covered with a bandage.  Wash your hands well.  Do not share personal care items, towels, or whirlpools with others.  Avoid skin contact with others.  Keep your skin and clothes clean around the abscess.  Keep all follow-up appointments as directed by your caregiver. SEEK MEDICAL CARE IF:   You have increased pain, swelling, redness, fluid drainage, or bleeding.  You have muscle aches, chills, or a general ill feeling.  You have a fever. MAKE SURE YOU:   Understand these instructions.  Will watch your condition.  Will get help right away if you are not doing well or get worse. Document Released: 03/14/2005 Document Revised: 12/04/2011 Document Reviewed: 08/17/2011 [facility] Patient Information 2015 [place], [place]. This information is not intended to replace advice given to you by your health care provider. Make sure you discuss any questions you have with your health care provider.

## 2015-04-19 ENCOUNTER — Telehealth: Payer: Self-pay | Admitting: Family Medicine

## 2015-04-19 NOTE — Telephone Encounter (Signed)
Follow up if not improved in the next week

## 2015-04-19 NOTE — Telephone Encounter (Signed)
[facility]  Patient Name: Jamie Shaffer  DOB: January 30, 1961    Initial Comment Caller says she has laryngitis and her throat really hurts. Is there an OTC she can take?   Nurse Assessment  Nurse: Harlow Mares, RN, Suanne Marker Date/Time Eilene Ghazi Time): 04/19/2015 4:01:36 PM  Confirm and document reason for call. If symptomatic, describe symptoms. ---Caller says she has laryngitis and her throat really hurts. Is there an OTC she can take? Symptoms began a couple of days ago. Denies fever. Also reports dry cough.  Has the patient traveled out of the country within the last 30 days? ---No  Does the patient have any new or worsening symptoms? ---Yes  Will a triage be completed? ---Yes  Related visit to physician within the last 2 weeks? ---No  Does the PT have any chronic conditions? (i.e. diabetes, asthma, etc.) ---Yes  List chronic conditions. ---anxiety     Guidelines    Guideline Title Affirmed Question Affirmed Notes  Sore Throat [1] Sore throat with cough/cold symptoms AND [2] present < 5 days (all triage questions negative)    Final Disposition User   [place], RN, Suanne Marker    Disagree/Comply: Comply

## 2015-04-21 NOTE — Telephone Encounter (Signed)
Left voicemail letting pt know to f/u if no improvement

## 2015-04-29 ENCOUNTER — Telehealth: Payer: Self-pay | Admitting: Family Medicine

## 2015-04-29 MED ORDER — ALPRAZOLAM 0.5 MG PO TABS: ORAL_TABLET | ORAL | Status: DC

## 2015-04-29 NOTE — Telephone Encounter (Signed)
Rx called in as prescribed and left voicemail letting pt know Rx sent and advise of Dr. Marliss Coots comments

## 2015-04-29 NOTE — Telephone Encounter (Signed)
Px written for call in   Do not drive with this Jamie Shaffer out for sedation

## 2015-04-29 NOTE — Addendum Note (Signed)
Addended by: Loura Pardon A on: 04/29/2015 01:47 PM   Modules accepted: Orders

## 2015-04-29 NOTE — Telephone Encounter (Signed)
Patient said she has anxiety when she flies.  She's flying to [place] for work on Wednesday.  Patient wanted to know if she can get a prescription for Xanax.  Patient uses [facility].  Please call patient.

## 2015-05-30 ENCOUNTER — Other Ambulatory Visit: Payer: Self-pay

## 2015-05-30 DIAGNOSIS — Z1231 Encounter for screening mammogram for malignant neoplasm of breast: Secondary | ICD-10-CM

## 2015-08-15 ENCOUNTER — Telehealth: Payer: Self-pay | Admitting: Family Medicine

## 2015-08-15 NOTE — Telephone Encounter (Signed)
PLEASE NOTE: All timestamps contained within this report are represented as [place] Standard Time. CONFIDENTIALTY NOTICE: This fax transmission is intended only for the addressee. It contains information that is legally privileged, confidential or otherwise protected from use or disclosure. If you are not the intended recipient, you are strictly prohibited from reviewing, disclosing, copying using or disseminating any of this information or taking any action in reliance on or regarding this information. If you have received this fax in error, please notify us immediately by telephone so that we can arrange for its return to [place]. Phone: [phone], Toll-Free: [phone], Fax: [phone] Page: 1 of 2 Call Id: [id number] [place] Patient Name: Jamie Shaffer Gender: Female DOB: 12/01/1960 Age: 55 Y 30 M 5 D Return Phone Number: [phone] (Primary), [phone] (Secondary) Address: City/State/Zip: [place] - Client Client Site [facility] [place], [place] Contact Type Call Who Is Calling Patient / Member / Family / Caregiver Call Type Triage / Clinical Relationship To Patient Self Return Phone Number [phone] (Primary) Chief Complaint Headache Reason for Call Symptomatic / Request for Health Information Initial Comment Caller States her head hurts, eyes hurt and runny, stuffy nose, been taking OTC but nothing is working. Appointment Disposition EMR Appointment Not Necessary Info pasted into Epic Yes PreDisposition Did not know what to do Translation No Nurse Assessment Nurse: Wayne Sever, RN, Tillie Rung Date/Time (Eastern Time): 08/15/2015 1:03:13 PM Confirm and document reason for call. If symptomatic, describe symptoms. You must click the next button to save text entered. ---Caller states she is worried about a  sinus infection. Caller states she started with congestion on Friday evening. She has a runny nose and headache. She got some Sudafed yesterday and she is starting to feel better again Has the patient traveled out of the country within the last 30 days? ---Not Applicable Does the patient have any new or worsening symptoms? ---Yes Will a triage be completed? ---Yes Related visit to physician within the last 2 weeks? ---No Does the PT have any chronic conditions? (i.e. diabetes, asthma, etc.) ---No Is this a behavioral health or substance abuse call? ---No Guidelines Guideline Title Affirmed Question Affirmed Notes Nurse Date/Time Eilene Ghazi Time) Sinus Pain or Congestion [1] Sinus congestion as part of a cold AND [2] present < 10 days (all triage questions negative) Wayne Sever, RN, Tillie Rung 08/15/2015 1:04:30 PM Disp. Time Eilene Ghazi Time) Disposition Final User PLEASE NOTE: All timestamps contained within this report are represented as [place] Standard Time. CONFIDENTIALTY NOTICE: This fax transmission is intended only for the addressee. It contains information that is legally privileged, confidential or otherwise protected from use or disclosure. If you are not the intended recipient, you are strictly prohibited from reviewing, disclosing, copying using or disseminating any of this information or taking any action in reliance on or regarding this information. If you have received this fax in error, please notify us immediately by telephone so that we can arrange for its return to [place]. Phone: [phone], Toll-Free: [phone], Fax: [phone] Page: 2 of 2 Call Id: [id number] 08/15/2015 1:07:12 PM Con[place], RN, Mable Paris Understands: Yes Disagree/Comply: Comply Care Advice Given Per Guideline HOME CARE: You should be able to treat this at home. REASSURANCE: * Sinus congestion is a normal part of a cold. * Usually home treatment with nasal washes can prevent an actual  bacterial sinus infection. * Antibiotics are  not helpful for the sinus congestion that occurs with colds. FOR A STUFFY NOSE - USE NASAL WASHES: * Introduction: Saline (salt water) nasal irrigation (nasal wash) is an effective and simple home remedy for treating stuffy nose and sinus congestion. The nose can be irrigated by pouring, spraying, or squirting salt water into the nose and then letting it run back out. * How it Helps: The salt water rinses out excess mucus, washes out any irritants (dust, allergens) that might be present, and moistens the nasal cavity. * Methods: There are several ways to perform nasal irrigation. You can use a saline nasal spray bottle (available over-the-counter), a rubber ear syringe, a medical syringe without the needle, or a NETI POT. * PSEUDOEPHEDRINE (Sudafed) is available OTC in pill form. Typical adult dosage is two 30 mg tablets every 6 hours. NASAL DECONGESTANTS FOR A VERY STUFFY NOSE: IBUPROFEN (E.G., MOTRIN, ADVIL): * Take 400 mg (two 200 mg pills) by mouth every 6 hours as needed. ACETAMINOPHEN (E.G., TYLENOL): * Take 650 mg (two 325 mg pills) by mouth every 4-6 hours as needed. Each Regular Strength Tylenol pill has 325 mg of acetaminophen. The most you should take each day is 3,250 mg (10 Regular Strength pills a day). CALL BACK IF: * Severe pain persists over 2 hours after pain medicine * Sinus pain persists over 1 day after using nasal washes * Sinus congestion (fullness) persists over 10 days CARE ADVICE given per Sinus Pain or Congestion (Adult) guideline. * Fever lasts over 3 days * You become worse. EXPECTED COURSE: * Sinus congestion from viral upper respiratory infections (colds) usually lasts 5-10 days. * Occasionally a cold can worsen and turn into bacterial sinusitis. Clues to this are sinus symptoms lasting longer than 10 days, fever lasting longer than 3 days and worsening pain. Bacterial sinusitis may need antibiotic treatment.

## 2015-08-15 NOTE — Telephone Encounter (Signed)
[facility]  Patient Name: Jamie Shaffer  DOB: December 08, 1960    Initial Comment Caller States her head hurts, eyes hurt and runny, stuffy nose, been taking OTC but nothing is working.    Nurse Assessment  Nurse: Wayne Sever, RN, Tillie Rung Date/Time (Eastern Time): 08/15/2015 1:03:13 PM  Confirm and document reason for call. If symptomatic, describe symptoms. You must click the next button to save text entered. ---Caller states she is worried about a sinus infection. Caller states she started with congestion on Friday evening. She has a runny nose and headache. She got some Sudafed yesterday and she is starting to feel better again  Has the patient traveled out of the country within the last 30 days? ---Not Applicable  Does the patient have any new or worsening symptoms? ---Yes  Will a triage be completed? ---Yes  Related visit to physician within the last 2 weeks? ---No  Does the PT have any chronic conditions? (i.e. diabetes, asthma, etc.) ---No  Is this a behavioral health or substance abuse call? ---No     Guidelines    Guideline Title Affirmed Question Affirmed Notes  Sinus Pain or Congestion [1] Sinus congestion as part of a cold AND [2] present < 10 days (all triage questions negative)    Final Disposition User   [place], RN, Tillie Rung    Disagree/Comply: Comply

## 2015-09-08 ENCOUNTER — Encounter: Payer: Self-pay | Admitting: Gastroenterology

## 2015-09-19 ENCOUNTER — Ambulatory Visit
Admission: RE | Admit: 2015-09-19 | Discharge: 2015-09-19 | Disposition: A | Discharge status: Home / Self Care | Payer: 59 | Source: Ambulatory Visit

## 2015-09-19 DIAGNOSIS — Z1231 Encounter for screening mammogram for malignant neoplasm of breast: Secondary | ICD-10-CM

## 2015-09-21 ENCOUNTER — Other Ambulatory Visit: Payer: Self-pay | Admitting: Obstetrics and Gynecology

## 2015-09-21 DIAGNOSIS — R928 Other abnormal and inconclusive findings on diagnostic imaging of breast: Secondary | ICD-10-CM

## 2015-09-23 ENCOUNTER — Ambulatory Visit
Admission: RE | Admit: 2015-09-23 | Discharge: 2015-09-23 | Disposition: A | Discharge status: Home / Self Care | Payer: 59 | Source: Ambulatory Visit | Attending: Obstetrics and Gynecology | Admitting: Obstetrics and Gynecology

## 2015-09-23 ENCOUNTER — Other Ambulatory Visit: Payer: Self-pay | Admitting: Obstetrics and Gynecology

## 2015-09-23 DIAGNOSIS — R928 Other abnormal and inconclusive findings on diagnostic imaging of breast: Secondary | ICD-10-CM

## 2015-09-23 DIAGNOSIS — N6320 Unspecified lump in the left breast, unspecified quadrant: Secondary | ICD-10-CM

## 2015-09-28 ENCOUNTER — Other Ambulatory Visit: Payer: Self-pay | Admitting: Obstetrics and Gynecology

## 2015-09-28 ENCOUNTER — Ambulatory Visit
Admission: RE | Admit: 2015-09-28 | Discharge: 2015-09-28 | Disposition: A | Discharge status: Home / Self Care | Payer: 59 | Source: Ambulatory Visit | Attending: Obstetrics and Gynecology | Admitting: Obstetrics and Gynecology

## 2015-09-28 DIAGNOSIS — N6320 Unspecified lump in the left breast, unspecified quadrant: Secondary | ICD-10-CM

## 2015-09-28 HISTORY — PX: BREAST BIOPSY: SHX20

## 2015-10-03 ENCOUNTER — Other Ambulatory Visit: Payer: Self-pay | Admitting: General Surgery

## 2015-10-03 DIAGNOSIS — C50912 Malignant neoplasm of unspecified site of left female breast: Secondary | ICD-10-CM

## 2015-10-03 DIAGNOSIS — C50412 Malignant neoplasm of upper-outer quadrant of left female breast: Secondary | ICD-10-CM

## 2015-10-04 ENCOUNTER — Encounter: Payer: Self-pay | Admitting: Genetic Counselor

## 2015-10-04 ENCOUNTER — Other Ambulatory Visit: Payer: 59

## 2015-10-04 ENCOUNTER — Ambulatory Visit (HOSPITAL_BASED_OUTPATIENT_CLINIC_OR_DEPARTMENT_OTHER): Payer: 59 | Admitting: Genetic Counselor

## 2015-10-04 DIAGNOSIS — Z803 Family history of malignant neoplasm of breast: Secondary | ICD-10-CM | POA: Diagnosis not present

## 2015-10-04 DIAGNOSIS — Z809 Family history of malignant neoplasm, unspecified: Secondary | ICD-10-CM

## 2015-10-04 DIAGNOSIS — Z315 Encounter for genetic counseling: Secondary | ICD-10-CM

## 2015-10-04 DIAGNOSIS — Z8042 Family history of malignant neoplasm of prostate: Secondary | ICD-10-CM

## 2015-10-04 DIAGNOSIS — Z8052 Family history of malignant neoplasm of bladder: Secondary | ICD-10-CM

## 2015-10-04 DIAGNOSIS — D0512 Intraductal carcinoma in situ of left breast: Secondary | ICD-10-CM | POA: Diagnosis not present

## 2015-10-04 DIAGNOSIS — Z808 Family history of malignant neoplasm of other organs or systems: Secondary | ICD-10-CM

## 2015-10-04 DIAGNOSIS — Z801 Family history of malignant neoplasm of trachea, bronchus and lung: Secondary | ICD-10-CM

## 2015-10-04 DIAGNOSIS — Z8051 Family history of malignant neoplasm of kidney: Secondary | ICD-10-CM

## 2015-10-04 DIAGNOSIS — Z800 Family history of malignant neoplasm of digestive organs: Secondary | ICD-10-CM | POA: Diagnosis not present

## 2015-10-04 NOTE — Progress Notes (Signed)
REFERRING PROVIDER: [name], MD  PRIMARY PROVIDER:  Loura Pardon, MD  PRIMARY REASON FOR VISIT:  1. Ductal carcinoma in situ (DCIS) of left breast   2. Family history of breast cancer in first degree relative   3. Family history of colon cancer   4. Family history of bladder cancer   5. Family history of melanoma   6. Family history of kidney cancer   7. Family history of prostate cancer   8. Family history of lung cancer   9. Family history of cancer      HISTORY OF PRESENT ILLNESS:   Ms. Digiacomo, a 55 y.o. female, was seen for a [facility] cancer genetics consultation at the request of Dr. Barry Dienes due to a personal history of breast cancer and family history of breast, colon, and other cancers.  Ms. Streight presents to clinic today with her husband to discuss the possibility of a hereditary predisposition to cancer, genetic testing, and to further clarify her future cancer risks, as well as potential cancer risks for family members.   In April 2017, at the age of 47, Ms. Mol was diagnosed with DCIS of the left breast.  Hormone receptors are pending. Genetic test results will help inform surgical and treatment decisions.    HORMONAL RISK FACTORS:  Menarche was at age 55.  First live birth at age 19.  OCP use for approximately 0 years.  Ovaries intact: yes.  Hysterectomy: yes, partial due to fibroids.  Menopausal status: postmenopausal.  HRT use: premarin for 1 month. Colonoscopy: yes; normal in 06/2010 other than external hemorrhoids Mammogram within the last year: yes. Number of breast biopsies: 1. Up to date with pelvic exams:  yes. Any excessive radiation exposure in the past:  no  Past Medical History  Diagnosis Date  . Fibrocystic breast   . Panic disorder   . Post-menopausal     Past Surgical History  Procedure Laterality Date  . Pilonidal cyst excision    . Fibroid tumor    . Cesarean section    . Vaginal hysterectomy      partial    Social  History   Social History  . Marital Status: Married    Spouse Name: N/A  . Number of Children: 2  . Years of Education: N/A   Occupational History  . [employer] time    Social History Main Topics  . Smoking status: Never Smoker   . Smokeless tobacco: Never Used     Comment: tried smoking as a teenager, but no continued use  . Alcohol Use: No     Comment: maybe one drink per year  . Drug Use: No  . Sexual Activity: Not Asked   Other Topics Concern  . None   Social History Narrative   Exercise regularly at the gym     FAMILY HISTORY:  We obtained a detailed, 4-generation family history.  Significant diagnoses are listed below: Family History  Problem Relation Age of Onset  . Hypertension Mother   . Diabetes Mother   . Heart failure Mother 72  . Breast cancer Mother 3    +lump  . Breast cancer Sister     dx. 79s; s/p mastectomy  . Breast cancer Sister 63    s/p lump; negative genetic testing  . Fibroids Sister   . Heart Problems Father     valve replacement  . Kidney failure Father 49  . Prostate cancer Father     dx. later age; did  not cause his death  . Breast cancer Sister 15    "cancerous cells"; s/p radiation  . Fibroids Sister   . Fibroids Sister   . Bladder Cancer Brother 62    smoker; had issues for 7-8 yrs before going to the doctor  . Colon cancer Maternal Aunt     dx. 34s  . Heart Problems Maternal Uncle   . Lung cancer Brother     s/p surgery to remove nodule?; not a smoker; dx. 61-62  . Alzheimer's disease Maternal Aunt     d. 64  . Colon cancer Maternal Uncle     dx. in 37s or younger  . Lung cancer Cousin     maternal 1st cousin dx. 69s; smoker  . Prostate cancer Cousin     maternal 1st cousin dx. early 74s  . Melanoma Cousin     maternal 1st cousin dx. 21s  . Breast cancer Cousin 19    maternal 1st cousin   . Kidney cancer Cousin     maternal 1st cousin dx. 44s  . Bladder Cancer Paternal Uncle     dx. late  97s-80s  . Aneurysm Maternal Grandmother     brain; d. 64s  . Colon cancer Maternal Grandfather     d. 40s-50s  . Heart disease Paternal Grandmother     d. 58s  . Heart attack Paternal Grandmother   . Heart disease Paternal Grandfather     d. 34s  . Heart attack Paternal Grandfather   . Breast cancer Cousin     paternal 1st cousin dx. 35s  . Cancer Cousin     (x2) paternal 1st cousins d. unspecified cancers at unspecified ages    Ms. Malson has two sons, ages 94 and 40.  Her oldest son has two sons and one daughter of his own.  Ms. Zellars has four full sisters and five full brothers.  One brother died of bladder cancer at 52, after having related issues for 7-8 years; he was a smoker.  Three brothers are ages 82-66 and have never had cancer.  One brother was diagnosed with lung cancer at age 79 and 69 and this was treated with surgery.  Three of Ms. Palecek sisters have had breast cancer.  One sister was diagnosed with breast cancer in her 79s and she underwent mastectomy; another sister was diagnosed with breast cancer at 54 and she had a lumpectomy and reportedly had negative genetic testing; the third sister was diagnosed with "cancerous cells of the breast" at 92 and she underwent radiation.  Ms. Camp fourth sister is currently 10 and has never had cancer.  Three of Ms. Lanni sisters have also had issues with fibroids and many have needed a partial hysterectomy.  Ms. Shawley mother died of heart failure at 65.  She had a history of breast cancer that was diagnosed at 61 and treated with a lumpectomy. Ms. Wyss mother had five full sisters and two full brothers.  One sister died of Alzheimer's at 71.  The remaining sisters are still living and are in their 70s-80s.  One aunt was diagnosed with colon cancer in her 32s.  Both of Ms. Knickerbocker maternal uncles have passed away--one died of a heart condition in his 40s, the other died of colon cancer in his 67s.  Four  maternal first cousins have been diagnosed with cancer.  One female cousin has a history of breast cancer at 11 and kidney cancer also in her 19s; another cousin was  diagnosed with melanoma in his 37s; one cousin was diagnosed with prostate cancer in his early 45s; and a fourth cousin was diagnosed with lung cancer in his 39s (he was also a smoker).  Ms. Butson maternal grandmother died of a brain aneurysm in her 36s.  Her maternal grandfather died of colon cancer in his 53s-50s.  Ms. Braddy has no further information for any maternal great aunts/uncles or great grandparents.  Ms. Zertuche father died of kidney failure at 46.  He had a history of prostate cancer diagnosed at a later age in life.  He had four full sisters and one full brother.  His sisters all passed away in their 68s-80s, mostly of heart disease and stroke.  His brother was diagnosed with bladder cancer in his late 35s-80s and passed away in his 12s.  One of his sisters had three daughters, all of whom were diagnosed with cancer.  One of these daughters was diagnosed with breast cancer in her 70s; the other two daughters had unspecified types of cancers.  Ms. Whyte paternal grandparents both died of either heart disease or a heart attack in their 62s.  She has no further information for any paternal great aunts/uncles or great grandparents.    Patient's maternal ancestors are of Caucasian, Native American ([place]), and [place] American descent, and paternal ancestors are of African American descent. There is no reported Ashkenazi Jewish ancestry. There is no known consanguinity.  GENETIC COUNSELING ASSESSMENT: JANI MORONTA is a 55 y.o. female with a personal and family history of cancer which is somewhat suggestive of a hereditary cancer syndrome and predisposition to cancer. We, therefore, discussed and recommended the following at today's visit.   DISCUSSION: We reviewed the characteristics, features and inheritance  patterns of hereditary cancer syndromes, particularly those caused by mutations within the BRCA1/2, CHEK2, and Lynch syndrome genes. We also discussed genetic testing, including the appropriate family members to test, the process of testing, insurance coverage and turn-around-time for results. We discussed the implications of a negative, positive and/or variant of uncertain significant result. We recommended Ms. Rhine pursue genetic testing for the 20-gene Breast/Ovarian Cancer Panel with tandem testing of the remaining 12 genes from the Comprehensive Cancer Panel (but will be called a 'Custom Panel'.  The Comprehensive Cancer Panel offered by [facility] includes sequencing and/or deletion duplication testing of the following 32 genes: APC, ATM, AXIN2, BARD1, BMPR1A, BRCA1, BRCA2, BRIP1, CDH1, CDK4, CDKN2A, CHEK2, EPCAM, FANCC, MLH1, MSH2, MSH6, MUTYH, NBN, PALB2, PMS2, POLD1, POLE, PTEN, RAD51C, RAD51D, SCG5/GREM1, SMAD4, STK11, TP53, VHL, and XRCC2.  Testing will ordered this way, so that we may have the results back from the 20-gene Breast/Ovarian Cancer Panel as a priority and hopefully by 2 weeks.  Based on Ms. Fabel personal and family history of cancer, she meets medical criteria for genetic testing. Despite that she meets criteria, she may still have an out of pocket cost. We discussed that if her out of pocket cost for testing is over $100, the laboratory will call and confirm whether she wants to proceed with testing.  If the out of pocket cost of testing is less than $100 she will be billed by the genetic testing laboratory.   PLAN: After considering the risks, benefits, and limitations, Ms. Morr  provided informed consent to pursue genetic testing and the blood sample was sent to [facility] for analysis of the 20-gene Breast/Ovarian Cancer Panel with 12-gene Custom Panel. Results from the first part of the test (the 20-gene Breast/Ovarian  Cancer Panel) should be available within  approximately 2 weeks' time, at which point they will be disclosed by telephone to Ms. Mian, as will any additional recommendations warranted by these results. Ms. Newhard will receive a summary of her genetic counseling visit and a copy of her results once available. This information will also be available in Epic. We encouraged Ms. Kuwahara to remain in contact with cancer genetics annually so that we can continuously update the family history and inform her of any changes in cancer genetics and testing that may be of benefit for her family. Ms. Bryars questions were answered to her satisfaction today. Our contact information was provided should additional questions or concerns arise.  Thank you for the referral and allowing [place] to share in the care of your patient.   Jeanine Luz, MS, [facility] Certified Genetic Counselor [email or web address] Phone: [phone]  The patient was seen for a total of 60 minutes in face-to-face genetic counseling.  This patient was discussed with Drs. Magrinat, Lindi Adie and/or Burr Medico who agrees with the above.    _______________________________________________________________________ For Office Staff:  Number of people involved in session: 2 Was an Intern/ student involved with case: no

## 2015-10-05 ENCOUNTER — Encounter: Payer: Self-pay | Admitting: Hematology and Oncology

## 2015-10-05 ENCOUNTER — Other Ambulatory Visit: Payer: Self-pay | Admitting: General Surgery

## 2015-10-05 ENCOUNTER — Telehealth: Payer: Self-pay | Admitting: Hematology and Oncology

## 2015-10-05 DIAGNOSIS — C50412 Malignant neoplasm of upper-outer quadrant of left female breast: Secondary | ICD-10-CM

## 2015-10-05 NOTE — Telephone Encounter (Signed)
Spoke with patient re new patient appointment with [facility] 10/24/2015 @ 3:45 pm to arrive 3:15 pm. Date per patient due to surgery not until 11/02/15 and she has had a lot of appointments and time off from work - cleared with navigator DS. Patient demographic and insurance information confirmed.

## 2015-10-07 ENCOUNTER — Ambulatory Visit: Payer: 59

## 2015-10-07 ENCOUNTER — Telehealth: Payer: Self-pay | Admitting: *Deleted

## 2015-10-07 ENCOUNTER — Ambulatory Visit
Admission: RE | Admit: 2015-10-07 | Discharge: 2015-10-07 | Disposition: A | Discharge status: Home / Self Care | Payer: 59 | Source: Ambulatory Visit | Attending: Radiation Oncology | Admitting: Radiation Oncology

## 2015-10-07 ENCOUNTER — Ambulatory Visit: Admission: RE | Admit: 2015-10-07 | Payer: 59 | Source: Ambulatory Visit | Admitting: Radiation Oncology

## 2015-10-07 ENCOUNTER — Ambulatory Visit: Payer: 59 | Admitting: Radiation Oncology

## 2015-10-07 DIAGNOSIS — C50412 Malignant neoplasm of upper-outer quadrant of left female breast: Secondary | ICD-10-CM | POA: Insufficient documentation

## 2015-10-07 DIAGNOSIS — Z87898 Personal history of other specified conditions: Secondary | ICD-10-CM | POA: Insufficient documentation

## 2015-10-07 DIAGNOSIS — Z90710 Acquired absence of both cervix and uterus: Secondary | ICD-10-CM | POA: Insufficient documentation

## 2015-10-07 DIAGNOSIS — Z170 Estrogen receptor positive status [ER+]: Secondary | ICD-10-CM | POA: Insufficient documentation

## 2015-10-07 NOTE — Telephone Encounter (Signed)
Received appt date/time from [facility].  Mailed new pt packet to pt.

## 2015-10-10 ENCOUNTER — Other Ambulatory Visit: Payer: 59

## 2015-10-10 ENCOUNTER — Ambulatory Visit
Admission: RE | Admit: 2015-10-10 | Discharge: 2015-10-10 | Disposition: A | Discharge status: Home / Self Care | Payer: 59 | Source: Ambulatory Visit | Attending: General Surgery | Admitting: General Surgery

## 2015-10-10 ENCOUNTER — Ambulatory Visit: Payer: 59 | Admitting: Oncology

## 2015-10-10 DIAGNOSIS — C50912 Malignant neoplasm of unspecified site of left female breast: Secondary | ICD-10-CM

## 2015-10-10 MED ORDER — GADOBENATE DIMEGLUMINE 529 MG/ML IV SOLN
15.0000 mL | Freq: Once | INTRAVENOUS | Status: AC | PRN
  Administered 2015-10-10: 15 mL via INTRAVENOUS

## 2015-10-10 NOTE — Progress Notes (Signed)
Quick Note:  Please let patient know that the MRI shows the mass as 1 cm, similar to what we thought before. They thought the area behind the nipple was a little prominent and just to be on the safe side they want to do another ultrasound of the left breast. ______

## 2015-10-13 ENCOUNTER — Other Ambulatory Visit: Payer: Self-pay | Admitting: General Surgery

## 2015-10-13 DIAGNOSIS — C50912 Malignant neoplasm of unspecified site of left female breast: Secondary | ICD-10-CM

## 2015-10-14 ENCOUNTER — Telehealth: Payer: Self-pay | Admitting: Genetic Counselor

## 2015-10-14 NOTE — Telephone Encounter (Signed)
Discussed with Ms. Mcqueary that her genetic test results were negative for pathogenic mutations within any of 32 genes on the Comprehensive Cancer Panel through [facility] (MSH2 inversion analysis was also negative) that would increase her genetic risk for breast, ovarian, colon, and other related cancers.  Additionally, no uncertain changes were found.  Discussed that we still do not have an explanation for Ms. Radziewicz personal and family history of cancer; there are still several family members with breast cancer (and a few with colon cancer), and of these family members who are still living, they should likely have genetic testing themselves.  Discussed that there could be a genetic cause for some of this family history that Ms. Passarelli did not inherit herself.  Encouraged her to call and update the family history with [place] in the future and to check back about updated testing options in the future.  At this point in time, she should continue to follow her doctors' recommendations for future cancer screening.  Her nieces can begin mammogram screening likely by their 79s based on Ms. Basher sister's diagnosis in her 75s.  Ms. Ficek would like a copy of her results mailed to her, so I will send that with a results letter.  I will make her doctors aware of this result.  She is welcome to call with any questions.

## 2015-10-17 ENCOUNTER — Ambulatory Visit: Payer: Self-pay | Admitting: Genetic Counselor

## 2015-10-17 DIAGNOSIS — Z1379 Encounter for other screening for genetic and chromosomal anomalies: Secondary | ICD-10-CM

## 2015-10-17 DIAGNOSIS — C50412 Malignant neoplasm of upper-outer quadrant of left female breast: Secondary | ICD-10-CM

## 2015-10-17 DIAGNOSIS — Z800 Family history of malignant neoplasm of digestive organs: Secondary | ICD-10-CM

## 2015-10-17 DIAGNOSIS — C801 Malignant (primary) neoplasm, unspecified: Secondary | ICD-10-CM

## 2015-10-17 DIAGNOSIS — Z809 Family history of malignant neoplasm, unspecified: Secondary | ICD-10-CM

## 2015-10-17 DIAGNOSIS — Z803 Family history of malignant neoplasm of breast: Secondary | ICD-10-CM

## 2015-10-17 HISTORY — DX: Malignant (primary) neoplasm, unspecified: C80.1

## 2015-10-18 NOTE — Progress Notes (Signed)
Location of Breast Cancer: Left Breast  Histology per Pathology Report:  09/28/15 Diagnosis Breast, left, needle core biopsy, 2:00 o'clock - INVASIVE DUCTAL CARCINOMA. - DUCTAL CARCINOMA IN SITU.   Receptor Status: ER(100%), PR (20%), Her2-neu (NEG), Ki-(10%)  Did patient present with symptoms or was this found on screening mammography?: It was found on a screening mammogram.   Past/Anticipated interventions by surgeon, if any: Lumpectomy scheduled for 11/02/15 by Dr. Barry Dienes  Past/Anticipated interventions by medical oncology, if any: She has an appointment with Dr. Lindi Adie 10/24/15 at 3:45.   Pain issues, if any: No  SAFETY ISSUES:  Prior radiation? No  Pacemaker/ICD? No  Possible current pregnancy? No  Is the patient on methotrexate? No  Current Complaints / other details:   HORMONAL RISK FACTORS:  Menarche was at age 38.  First live birth at age 49. G2P2 OCP use for approximately 0 years.  Ovaries intact: yes.  Hysterectomy: yes, partial due to fibroids.  Menopausal status: postmenopausal.  HRT use: premarin for 1 month.  10/14/15 Genetic Counseling noted: Discussed with [name] that her genetic test results were negative for pathogenic mutations within any of 32 genes on the Comprehensive Cancer Panel  BP 117/72 mmHg  Pulse 72  Temp(Src) 97.8 F (36.6 C) (Oral)  Resp 16  Ht _0  (1.6 m)  Wt 171 lb (77.565 kg)  BMI 30.30 kg/m2  SpO2 98%     [name], [name], RN 10/18/2015,6:12 PM

## 2015-10-18 NOTE — Progress Notes (Deleted)
error 

## 2015-10-24 ENCOUNTER — Ambulatory Visit
Admission: RE | Admit: 2015-10-24 | Discharge: 2015-10-24 | Disposition: A | Discharge status: Home / Self Care | Payer: 59 | Source: Ambulatory Visit | Attending: Radiation Oncology | Admitting: Radiation Oncology

## 2015-10-24 ENCOUNTER — Encounter: Payer: Self-pay | Admitting: Genetic Counselor

## 2015-10-24 ENCOUNTER — Encounter: Payer: Self-pay | Admitting: Radiation Oncology

## 2015-10-24 ENCOUNTER — Telehealth: Payer: Self-pay | Admitting: Hematology and Oncology

## 2015-10-24 ENCOUNTER — Ambulatory Visit (HOSPITAL_BASED_OUTPATIENT_CLINIC_OR_DEPARTMENT_OTHER): Payer: 59 | Admitting: Hematology and Oncology

## 2015-10-24 ENCOUNTER — Encounter: Payer: Self-pay | Admitting: Hematology and Oncology

## 2015-10-24 ENCOUNTER — Encounter: Payer: Self-pay | Admitting: *Deleted

## 2015-10-24 ENCOUNTER — Ambulatory Visit
Admission: RE | Admit: 2015-10-24 | Discharge: 2015-10-24 | Disposition: A | Discharge status: Home / Self Care | Payer: 59 | Source: Ambulatory Visit | Attending: General Surgery | Admitting: General Surgery

## 2015-10-24 VITALS — BP 117/72 | HR 72 | Temp 97.8°F | Resp 16 | Ht 63.0 in | Wt 171.0 lb

## 2015-10-24 VITALS — BP 117/72 | HR 72 | Temp 97.8°F | Resp 16 | Wt 171.4 lb

## 2015-10-24 DIAGNOSIS — C50412 Malignant neoplasm of upper-outer quadrant of left female breast: Secondary | ICD-10-CM

## 2015-10-24 DIAGNOSIS — Z90710 Acquired absence of both cervix and uterus: Secondary | ICD-10-CM | POA: Diagnosis not present

## 2015-10-24 DIAGNOSIS — Z87898 Personal history of other specified conditions: Secondary | ICD-10-CM | POA: Diagnosis not present

## 2015-10-24 DIAGNOSIS — Z170 Estrogen receptor positive status [ER+]: Secondary | ICD-10-CM | POA: Diagnosis not present

## 2015-10-24 DIAGNOSIS — Z1379 Encounter for other screening for genetic and chromosomal anomalies: Secondary | ICD-10-CM | POA: Insufficient documentation

## 2015-10-24 DIAGNOSIS — C50912 Malignant neoplasm of unspecified site of left female breast: Secondary | ICD-10-CM

## 2015-10-24 DIAGNOSIS — D0512 Intraductal carcinoma in situ of left breast: Secondary | ICD-10-CM

## 2015-10-24 NOTE — Progress Notes (Signed)
GENETIC TEST RESULT  HPI: Ms. Durrett was previously seen in the [facility] due to a personal history of breast cancer, family history of breast, colon, and other cancers, and concerns regarding a hereditary predisposition to cancer. Please refer to our prior cancer genetics clinic note from October 04, 2015 for more information regarding Ms. Pinela medical, social and family histories, and our assessment and recommendations, at the time. Ms. Roadcap recent genetic test results were disclosed to her, as were recommendations warranted by these results. These results and recommendations are discussed in more detail below.  GENETIC TEST RESULTS: At the time of Ms. Heeg visit on 10/04/15, we recommended she pursue genetic testing of the 32-gene Comprehensive Cancer Panel through [facility] in [place], [place]. The Comprehensive Cancer Panel offered by [facility] includes sequencing and/or deletion duplication testing of the following 32 genes: APC, ATM, AXIN2, BARD1, BMPR1A, BRCA1, BRCA2, BRIP1, CDH1, CDK4, CDKN2A, CHEK2, EPCAM, FANCC, MLH1, MSH2 (with MSH2 Exons 1-7 Inversion Analysis), MSH6, MUTYH, NBN, PALB2, PMS2, POLD1, POLE, PTEN, RAD51C, RAD51D, SCG5/GREM1, SMAD4, STK11, TP53, VHL, and XRCC2.  Those results are now back, the report date for which is October 14, 2015.  Genetic testing was normal, and did not reveal a deleterious mutation in these genes.  Additionally, no variants of uncertain significance (VUSes) were found.  The test report will be scanned into EPIC and will be located under the Results Review tab in the Pathology>Molecular Pathology section.   We discussed with Ms. Conaway that since the current genetic testing is not perfect, it is possible there may be a gene mutation in one of these genes that current testing cannot detect, but that chance is small. We also discussed, that it is possible that another gene that has not yet been discovered, or  that we have not yet tested, is responsible for the cancer diagnoses in the family, and it is, therefore, important to remain in touch with cancer genetics in the future so that we can continue to offer Ms. Vanderbeck the most up-to-date genetic testing.   CANCER SCREENING RECOMMENDATIONS: Given Ms. Steven personal and family histories, we must interpret these negative results with some caution.  Families with features suggestive of hereditary risk for cancer tend to have multiple family members with cancer, diagnoses in multiple generations and diagnoses before the age of 64. Ms. Bugbee family exhibits some of these features. Thus this result may simply reflect our current inability to detect all mutations within these genes or there may be a different gene that has not yet been discovered or tested.   Since we still do not have an explanation for Ms. Difranco personal and family history of cancer and there are still several family members with breast cancer (and a few with colon cancer), these affected family members who are still living, should likely have genetic testing themselves.  There could be a genetic cause for some of this family history that Ms. Podgurski did not inherit herself.  We encourage Ms. Cancro to call and update the family history with [place] in the future and to check back about updated testing options in the future.  At this point in time, she should continue to follow her doctors' recommendations for future cancer screening and should discuss with her doctor whether or not additional breast cancer screening would be warranted in the future.  She should also continue to follow her doctors' recommendations for colonoscopy screening.  RECOMMENDATIONS FOR FAMILY MEMBERS: Women in this family might be  at some increased risk of developing cancer, over the general population risk, simply due to the family history of cancer. We recommended women in this family have a yearly mammogram  beginning at age 32, or 64 years younger than the earliest onset of cancer, an an annual clinical breast exam, and perform monthly breast self-exams. Women in this family should also have a gynecological exam as recommended by their primary provider. All family members should have a colonoscopy by age 65.  FOLLOW-UP: Lastly, we discussed with Ms. Zecca that cancer genetics is a rapidly advancing field and it is possible that new genetic tests will be appropriate for her and/or her family members in the future. We encouraged her to remain in contact with cancer genetics on an annual basis so we can update her personal and family histories and let her know of advances in cancer genetics that may benefit this family.   Our contact number was provided. Ms. Wechter questions were answered to her satisfaction, and she knows she is welcome to call us at anytime with additional questions or concerns.   Jeanine Luz, MS, [facility] Certified Genetic Counselor [email or web address] Phone: [phone]

## 2015-10-24 NOTE — Telephone Encounter (Signed)
appt made and avs printed °

## 2015-10-24 NOTE — Progress Notes (Signed)
Radiation Oncology         [phone] ________________________________  Initial outpatient Consultation  Name: Jamie Shaffer MRN: [id number]  Date: 10/24/2015  DOB: 09/10/1960  CC:Jamie Pardon, MD  Jamie Klein, MD   REFERRING PHYSICIAN: [name], MD  DIAGNOSIS:  [id number]  Stage Left Breast UOQ Invasive Ductal Carcinoma, ER100% / PR20% / Her2neg, Grade 1  HISTORY OF PRESENT ILLNESS::Jamie Shaffer is a 55 y.o. female who presented with possible left breast mass on screening tomo mammogram. Mass was 1.2cm on [place] and axilla was clear.  MRI was performed  Of breasts showing a single tumor and no abnormal nodes.  Retroareolar enhancement of the left breast was noted, and felt to be normal, related to inverted nipple, with a followup US.  In regards to the left breast UOQ mass,  Biopsy showed invasive ductal carcinoma with DCIS and with characteristics as described above in the diagnosis.  Due to family history, genetics referral was made and results were negative.  She sees Dr Lindi Adie today.  Anticipates lumpectomy with Dr Barry Dienes.Marland Kitchen    She works near [employer]. Pain issues, if any: No  SAFETY ISSUES:  Prior radiation? No  Pacemaker/ICD? No  Possible current pregnancy? No  Is the patient on methotrexate? No  Current Complaints / other details:  HORMONAL RISK FACTORS:  Menarche was at age 37.  First live birth at age 67. G2P2 OCP use for approximately 0 years.  Ovaries intact: yes.  Hysterectomy: yes, partial due to fibroids.  Menopausal status: postmenopausal.  HRT use: premarin for 1 month.   PREVIOUS RADIATION THERAPY: No  PAST MEDICAL HISTORY:  has a past medical history of Fibrocystic breast; Panic disorder; Post-menopausal; GERD (gastroesophageal reflux disease); and Cancer ([place]) (10-2015).    PAST SURGICAL HISTORY: Past Surgical History  Procedure Laterality Date  . Pilonidal cyst excision    . Fibroid tumor    . Cesarean section      . Vaginal hysterectomy      partial    FAMILY HISTORY: family history includes Alzheimer's disease in her maternal aunt; Aneurysm in her maternal grandmother; Bladder Cancer in her paternal uncle; Bladder Cancer (age of onset: 70) in her brother; Breast cancer in her cousin and sister; Breast cancer (age of onset: 35) in her cousin; Breast cancer (age of onset: 76) in her sister; Breast cancer (age of onset: 67) in her sister; Breast cancer (age of onset: 51) in her mother; Cancer in her cousin; Colon cancer in her maternal aunt, maternal grandfather, and maternal uncle; Diabetes in her mother; Fibroids in her sister, sister, and sister; Heart Problems in her father and maternal uncle; Heart attack in her paternal grandfather and paternal grandmother; Heart disease in her paternal grandfather and paternal grandmother; Heart failure (age of onset: 21) in her mother; Hypertension in her mother; Kidney cancer in her cousin; Kidney failure (age of onset: 1) in her father; Lung cancer in her brother and cousin; Melanoma in her cousin; Prostate cancer in her cousin and father.  SOCIAL HISTORY:  reports that she has never smoked. She has never used smokeless tobacco. She reports that she does not drink alcohol or use illicit drugs.  ALLERGIES: Review of patient's allergies indicates no known allergies.  MEDICATIONS:  Current Outpatient Prescriptions  Medication Sig Dispense Refill  . ALPRAZolam (XANAX) 0.5 MG tablet Take 1 pill by mouth once daily as needed for anxiety when traveling 20 tablet 0  . Multiple Vitamin (MULTIVITAMIN) tablet Take 1 tablet  by mouth daily.       No current facility-administered medications for this encounter.    REVIEW OF SYSTEMS:  Notable for that above.   PHYSICAL EXAM:  height is '5\' 3"'  (1.6 m) and weight is 171 lb (77.565 kg). Her oral temperature is 97.8 F (36.6 C). Her blood pressure is 117/72 and her pulse is 72. Her respiration is 16 and oxygen saturation is 98%.    General: Alert and oriented, in no acute distress HEENT: Head is normocephalic. Extraocular movements are intact. Oropharynx is clear. Neck: Neck is supple, no palpable cervical or supraclavicular lymphadenopathy. Heart: Regular in rate and rhythm with no murmurs, rubs, or gallops. Chest: Clear to auscultation bilaterally, with no rhonchi, wheezes, or rales. Abdomen: Soft, nontender, nondistended, with no rigidity or guarding. Extremities: No cyanosis or edema. Lymphatics: see Neck Exam Skin: No concerning lesions. Musculoskeletal: symmetric strength and muscle tone throughout. Neurologic: Cranial nerves II through XII are grossly intact. No obvious focalities. Speech is fluent. Coordination is intact. Psychiatric: Judgment and insight are intact. Affect is appropriate. Breast no obvious palpable masses appreciated in the breasts or axillae , she has inverted nipples. Marland Kitchen    ECOG = 0  0 - Asymptomatic (Fully active, able to carry on all predisease activities without restriction)  1 - Symptomatic but completely ambulatory (Restricted in physically strenuous activity but ambulatory and able to carry out work of a light or sedentary nature. For example, light housework, office work)  2 - Symptomatic, <50% in bed during the day (Ambulatory and capable of all self care but unable to carry out any work activities. Up and about more than 50% of waking hours)  3 - Symptomatic, >50% in bed, but not bedbound (Capable of only limited self-care, confined to bed or chair 50% or more of waking hours)  4 - Bedbound (Completely disabled. Cannot carry on any self-care. Totally confined to bed or chair)  5 - Death   Eustace Pen MM, Creech RH, Tormey DC, et al. [phone]). "Toxicity and response criteria of the [facility]". [place] Oncol. 5 (6): 649-55   LABORATORY DATA:  Lab Results  Component Value Date   WBC 4.7 09/03/2014   HGB 12.1 09/03/2014   HCT 36.8 09/03/2014   MCV  83.0 09/03/2014   PLT 309.0 09/03/2014   CMP     Component Value Date/Time   NA 137 09/03/2014 0819   K 4.3 09/03/2014 0819   CL 104 09/03/2014 0819   CO2 30 09/03/2014 0819   GLUCOSE 94 09/03/2014 0819   BUN 20 09/03/2014 0819   CREATININE 0.74 09/03/2014 0819   CREATININE 0.72 01/16/2013 1639   CALCIUM 9.4 09/03/2014 0819   PROT 7.2 09/03/2014 0819   ALBUMIN 3.8 09/03/2014 0819   AST 21 09/03/2014 0819   ALT 23 09/03/2014 0819   ALKPHOS 83 09/03/2014 0819   BILITOT 0.4 09/03/2014 0819   GFRNONAA 117.42 01/17/2010 0849   GFRAA 115 10/20/2007 0953         RADIOGRAPHY: Mr Breast Bilateral W Wo Contrast  10/10/2015  CLINICAL DATA:  55 year old patient with recent diagnosis of grade 1 invasive ductal carcinoma and ductal carcinoma in situ of the left breast following ultrasound-guided biopsy of a mass in the 2 o'clock position. Strong family history of breast cancer. LABS:  None obtained EXAM: BILATERAL BREAST MRI WITH AND WITHOUT CONTRAST TECHNIQUE: Multiplanar, multisequence MR images of both breasts were obtained prior to and following the intravenous administration of 15  ml of MultiHance. THREE-DIMENSIONAL MR IMAGE RENDERING ON INDEPENDENT WORKSTATION: Three-dimensional MR images were rendered by post-processing of the original MR data on an independent workstation. The three-dimensional MR images were interpreted, and findings are reported in the following complete MRI report for this study. Three dimensional images were evaluated at the independent DynaCad workstation COMPARISON:  Previous exam(s) dating back to 2010. FINDINGS: Breast composition: c. Heterogeneous fibroglandular tissue. Background parenchymal enhancement: Mild Right breast: No mass or abnormal enhancement. Left breast: In the posterior third of the upper outer quadrant of the left breast is an irregular enhancing mass with washout kinetics that measures 7 x 5 x 10 mm. There is associated biopsy clip artifact. This  corresponds to the recently biopsied invasive ductal carcinoma/ductal carcinoma in situ. The left nipple appears to be partially inverted, with no significant nipple extending beyond the skin line. There is a smoothly marginated but intensely enhancing oval area in the retroareolar left breast likely associated with the left nipple. When correlated with multiple prior mammograms dating back to 2010, this is favored to be enhancement of the partially retroverted left nipple, rather than a suspicious mass. Lymph nodes: No abnormal appearing lymph nodes. Ancillary findings:  None. IMPRESSION: 1. Biopsy proven malignancy in the posterior third of the upper outer quadrant left breast measures 7 x 5 x 10 mm on MRI. 2. Probable prominent enhancement of a partially retroverted left nipple. A retroareolar enhancing mass is felt to be less likely. I recommend the patient return for direct visual inspection and ultrasound of the retroareolar left breast. RECOMMENDATION: Second-look ultrasound of the left breast as described above. BI-RADS CATEGORY  6: Known biopsy-proven malignancy. Electronically Signed   By: Curlene Dolphin M.D.   On: 10/10/2015 11:58   Mm Digital Diagnostic Unilat L  09/28/2015  CLINICAL DATA:  Status post ultrasound-guided core biopsy left breast mass EXAM: DIAGNOSTIC LEFT MAMMOGRAM POST ULTRASOUND BIOPSY COMPARISON:  Previous exam(s). FINDINGS: Mammographic images were obtained following left breast ultrasound guided biopsy of spiculated mass at 2 o'clock. Cc and lateral views of the left breast demonstrate ribbon biopsy clip in the mass of concern IMPRESSION: Post biopsy mammogram demonstrating biopsy clip area of concern. Final Assessment: Post Procedure Mammograms for Marker Placement Electronically Signed   By: Abelardo Diesel M.D.   On: 09/28/2015 15:17   US Breast Ltd Uni Left Inc Axilla  10/24/2015  CLINICAL DATA:  Recent ultrasound-guided core biopsy of a mass in the 2 o'clock location of the  left breast revealed in situ and invasive ductal carcinoma.Ultrasound followup after recent MRI. The area of concern is retroareolar left breast enhancement, possibly attributed to nipple inversion. Patient reports intermittent bilateral nipple inversion for many years, left greater than right. EXAM: ULTRASOUND OF THE LEFT BREAST COMPARISON:  MRI 10/10/2015, mammogram and ultrasound 09/23/2015 and earlier FINDINGS: On physical exam, there is left nipple inversion, symmetric to the right on today's physical exam. I palpate no mass within the nipple for retroareolar region of the left breast. Targeted ultrasound is performed, showing normal appearing left nipple on ultrasound. There is no mass within the nipple or retroareolar region on the left. The appearance of the left nipple is symmetric compared to the right which is scan for comparison purposes. IMPRESSION: No ultrasound abnormality within or posterior to left nipple. Mild nipple inversion noted on today's exam, likely accounting for the MRI findings. RECOMMENDATION: Treatment plan for known left breast cancer I have discussed the findings and recommendations with the patient. Results were also  provided in writing at the conclusion of the visit. If applicable, a reminder letter will be sent to the patient regarding the next appointment. BI-RADS CATEGORY  1: Negative. Electronically Signed   By: Nolon Nations M.D.   On: 10/24/2015 14:32   [place] Lt Breast Bx W Loc Dev 1st Lesion Img Bx Spec US Guide  09/29/2015  ADDENDUM REPORT: 09/29/2015 14:34 ADDENDUM: Pathology revealed GRADE I INVASIVE DUCTAL CARCINOMA, DUCTAL CARCINOMA IN SITU of the Left breast at the 2:00 o'clock location. This was found to be concordant by Dr. Abelardo Diesel. Pathology results were discussed with the patient by telephone. The patient reported doing well after the biopsy with tenderness at the site. Post biopsy instructions and care were reviewed and questions were answered. The patient  was encouraged to call [facility] for any additional concerns. Surgical consultation has been arranged with Dr. Normajean Glasgow at [facility] on October 03, 2015. Pathology results reported by Terie Purser, RN on 09/29/2015. Electronically Signed   By: Abelardo Diesel M.D.   On: 09/29/2015 14:34  09/29/2015  CLINICAL DATA:  Left breast mass for biopsy. EXAM: ULTRASOUND GUIDED LEFT BREAST CORE NEEDLE BIOPSY COMPARISON:  Previous exam(s). FINDINGS: I met with the patient and we discussed the procedure of ultrasound-guided biopsy, including benefits and alternatives. We discussed the high likelihood of a successful procedure. We discussed the risks of the procedure, including infection, bleeding, tissue injury, clip migration, and inadequate sampling. Informed written consent was given. The usual time-out protocol was performed immediately prior to the procedure. Using sterile technique and 1% Lidocaine as local anesthetic, under direct ultrasound visualization, a 14 gauge spring-loaded device was used to perform biopsy of hypoechoic spiculated mass at left breast o'clock using a lateral approach. At the conclusion of the procedure a ribbon tissue marker clip was deployed into the biopsy cavity. Follow up 2 view mammogram was performed and dictated separately. IMPRESSION: Ultrasound guided biopsy of left breast.  No apparent complications. Electronically Signed: By: Abelardo Diesel M.D. On: 09/28/2015 15:16      IMPRESSION/PLAN: Stage I ER+ left breast cancer   It was a pleasure meeting the patient today. We discussed the risks, benefits, and side effects of radiotherapy. I recommend radiotherapy to the left breast to reduce her risk of locoregional recurrence by 2/3.  We discussed that radiation would take approximately 4 weeks to complete if she does not need chemotherapy but about 6 weeks if it follows chemotherapy, and that I would give the patient a few weeks to heal  following surgery before starting treatment planning.   If chemotherapy were to be given, this would precede radiotherapy. We spoke about acute effects including skin irritation and fatigue as well as much less common late effects including internal organ injury or irritation. We spoke about the latest technology that is used to minimize the risk of late effects for patients undergoing radiotherapy to the breast or chest wall. No guarantees of treatment were given. The patient is enthusiastic about proceeding with treatment. I look forward to participating in the patient's care; or, if she decides to receive RT in [place], we will made a referral there.  She will think about where she'd like her care after she gets past her surgery.   __________________________________________   Eppie Gibson, MD

## 2015-10-24 NOTE — Assessment & Plan Note (Signed)
Left breast biopsy 2:00 position 09/28/2015: IDC with DCIS, grade 1, ER 100%, PR 20%, Ki-67 10%, HER-2 negative ratio 1.32, MRI revealed 7 x 5 x 10 mm in the posterior third left breast, T1 cN0 stage IA clinical stage  Pathology and radiology counseling:Discussed with the patient, the details of pathology including the type of breast cancer,the clinical staging, the significance of ER, PR and HER-2/neu receptors and the implications for treatment. After reviewing the pathology in detail, we proceeded to discuss the different treatment options between surgery, radiation, chemotherapy, antiestrogen therapies.  Recommendations: 1. Breast conserving surgery followed by 2. Oncotype DX testing to determine if chemotherapy would be of any benefit followed by 3. Adjuvant radiation therapy followed by 4. Adjuvant antiestrogen therapy  Oncotype counseling: I discussed Oncotype DX test. I explained to the patient that this is a 21 gene panel to evaluate patient tumors DNA to calculate recurrence score. This would help determine whether patient has high risk or intermediate risk or low risk breast cancer. She understands that if her tumor was found to be high risk, she would benefit from systemic chemotherapy. If low risk, no need of chemotherapy. If she was found to be intermediate risk, we would need to evaluate the score as well as other risk factors and determine if an abbreviated chemotherapy may be of benefit.  Return to clinic after surgery to discuss final pathology report and then determine if Oncotype DX testing will need to be sent.

## 2015-10-24 NOTE — Progress Notes (Signed)
[place] NOTE  Patient Care Team: Abner Greenspan, MD as PCP - General  CHIEF COMPLAINTS/PURPOSE OF CONSULTATION:  Newly diagnosed breast cancer  HISTORY OF PRESENTING ILLNESS:  Jamie Shaffer 55 y.o. female is here because of recent diagnosis of left breast cancer. She had a routine screening mammogram detected abnormality that led to further investigations including ultrasound and a biopsy which revealed grade 1 invasive ductal carcinoma with DCIS that was ER/PR positive HER-2 negative with a Ki-67 10%. She was seen by Dr. Barry Dienes who obtained a breast MRI that revealed a 1 limited tumor. She even had ultrasound after this to evaluate the left nipple inversion. The ultrasound was normal. She has been scheduled to undergo lumpectomy surgery on 11/02/2015.she had met with Dr. Isidore Moos with radiation oncology and she is meeting with [place] to discuss a treatment plan.  Patient lives between [place] and [place]. It would appear that [facility] would be much closer than [place].  I reviewed her records extensively and collaborated the history with the patient.  SUMMARY OF ONCOLOGIC HISTORY:   Breast cancer of upper-outer quadrant of left female breast ([place])   09/28/2015 Initial Diagnosis Left breast biopsy 2:00 position: IDC with DCIS, grade 1, ER 100%, PR 20%, Ki-67 10%, HER-2 negative ratio 1.32, MRI revealed 7 x 5 x 10 mm in the posterior third left breast, T1 cN0 stage IA clinical stage   MEDICAL HISTORY:  Past Medical History  Diagnosis Date  . Fibrocystic breast   . Panic disorder   . Post-menopausal     SURGICAL HISTORY: Past Surgical History  Procedure Laterality Date  . Pilonidal cyst excision    . Fibroid tumor    . Cesarean section    . Vaginal hysterectomy      partial    SOCIAL HISTORY: Social History   Social History  . Marital Status: Married    Spouse Name: N/A  . Number of Children: 2  . Years of Education: N/A   Occupational  History  . [employer] time    Social History Main Topics  . Smoking status: Never Smoker   . Smokeless tobacco: Never Used     Comment: tried smoking as a teenager, but no continued use  . Alcohol Use: No     Comment: maybe one drink per year  . Drug Use: No  . Sexual Activity: Not on file   Other Topics Concern  . Not on file   Social History Narrative   Exercise regularly at the gym    FAMILY HISTORY: Family History  Problem Relation Age of Onset  . Hypertension Mother   . Diabetes Mother   . Heart failure Mother 18  . Breast cancer Mother 53    +lump  . Breast cancer Sister     dx. 71s; s/p mastectomy  . Breast cancer Sister 58    s/p lump; negative genetic testing  . Fibroids Sister   . Heart Problems Father     valve replacement  . Kidney failure Father 64  . Prostate cancer Father     dx. later age; did not cause his death  . Breast cancer Sister 13    "cancerous cells"; s/p radiation  . Fibroids Sister   . Fibroids Sister   . Bladder Cancer Brother 23    smoker; had issues for 7-8 yrs before going to the doctor  . Colon cancer Maternal Aunt     dx. 49s  . Heart  Problems Maternal Uncle   . Lung cancer Brother     s/p surgery to remove nodule?; not a smoker; dx. 61-62  . Alzheimer's disease Maternal Aunt     d. 17  . Colon cancer Maternal Uncle     dx. in 16s or younger  . Lung cancer Cousin     maternal 1st cousin dx. 71s; smoker  . Prostate cancer Cousin     maternal 1st cousin dx. early 72s  . Melanoma Cousin     maternal 1st cousin dx. 1s  . Breast cancer Cousin 66    maternal 1st cousin   . Kidney cancer Cousin     maternal 1st cousin dx. 55s  . Bladder Cancer Paternal Uncle     dx. late 5s-80s  . Aneurysm Maternal Grandmother     brain; d. 39s  . Colon cancer Maternal Grandfather     d. 40s-50s  . Heart disease Paternal Grandmother     d. 68s  . Heart attack Paternal Grandmother   . Heart disease Paternal  Grandfather     d. 1s  . Heart attack Paternal Grandfather   . Breast cancer Cousin     paternal 1st cousin dx. 45s  . Cancer Cousin     (x2) paternal 1st cousins d. unspecified cancers at unspecified ages    42:  has No Known Allergies.  MEDICATIONS:  Current Outpatient Prescriptions  Medication Sig Dispense Refill  . ALPRAZolam (XANAX) 0.5 MG tablet Take 1 pill by mouth once daily as needed for anxiety when traveling 20 tablet 0  . Multiple Vitamin (MULTIVITAMIN) tablet Take 1 tablet by mouth daily.       No current facility-administered medications for this visit.    REVIEW OF SYSTEMS:   Constitutional: Denies fevers, chills or abnormal night sweats Eyes: Denies blurriness of vision, double vision or watery eyes Ears, nose, mouth, throat, and face: Denies mucositis or sore throat Respiratory: Denies cough, dyspnea or wheezes Cardiovascular: Denies palpitation, chest discomfort or lower extremity swelling Gastrointestinal:  Denies nausea, heartburn or change in bowel habits Skin: Denies abnormal skin rashes Lymphatics: Denies new lymphadenopathy or easy bruising Neurological:Denies numbness, tingling or new weaknesses Behavioral/Psych: Mood is stable, no new changes  Breast:  Denies any palpable lumps or discharge All other systems were reviewed with the patient and are negative.  PHYSICAL EXAMINATION: ECOG PERFORMANCE STATUS: 0 - Asymptomatic  Filed Vitals:   10/24/15 1545  BP: 117/72  Pulse: 72  Temp: 97.8 F (36.6 C)  Resp: 16   Filed Weights   10/24/15 1545  Weight: 171 lb 6.4 oz (77.747 kg)    GENERAL:alert, no distress and comfortable SKIN: skin color, texture, turgor are normal, no rashes or significant lesions EYES: normal, conjunctiva are pink and non-injected, sclera clear OROPHARYNX:no exudate, no erythema and lips, buccal mucosa, and tongue normal  NECK: supple, thyroid normal size, non-tender, without nodularity LYMPH:  no palpable  lymphadenopathy in the cervical, axillary or inguinal LUNGS: clear to auscultation and percussion with normal breathing effort HEART: regular rate & rhythm and no murmurs and no lower extremity edema ABDOMEN:abdomen soft, non-tender and normal bowel sounds Musculoskeletal:no cyanosis of digits and no clubbing  PSYCH: alert & oriented x 3 with fluent speech NEURO: no focal motor/sensory deficits BREAST: No palpable nodules in breast. No palpable axillary or supraclavicular lymphadenopathy (exam performed in the presence of a chaperone)   LABORATORY DATA:  I have reviewed the data as listed Lab Results  Component Value  Date   WBC 4.7 09/03/2014   HGB 12.1 09/03/2014   HCT 36.8 09/03/2014   MCV 83.0 09/03/2014   PLT 309.0 09/03/2014   Lab Results  Component Value Date   NA 137 09/03/2014   K 4.3 09/03/2014   CL 104 09/03/2014   CO2 30 09/03/2014    RADIOGRAPHIC STUDIES: I have personally reviewed the radiological reports and agreed with the findings in the report.  ASSESSMENT AND PLAN:  Breast cancer of upper-outer quadrant of left female breast ([place]) Left breast biopsy 2:00 position 09/28/2015: IDC with DCIS, grade 1, ER 100%, PR 20%, Ki-67 10%, HER-2 negative ratio 1.32, MRI revealed 7 x 5 x 10 mm in the posterior third left breast, T1 cN0 stage IA clinical stage  Pathology and radiology counseling:Discussed with the patient, the details of pathology including the type of breast cancer,the clinical staging, the significance of ER, PR and HER-2/neu receptors and the implications for treatment. After reviewing the pathology in detail, we proceeded to discuss the different treatment options between surgery, radiation, chemotherapy, antiestrogen therapies.  Recommendations: 1. Breast conserving surgery followed by 2. Oncotype DX testing to determine if chemotherapy would be of any benefit followed by 3. Adjuvant radiation therapy followed by 4. Adjuvant antiestrogen  therapy  Oncotype counseling: I discussed Oncotype DX test. I explained to the patient that this is a 21 gene panel to evaluate patient tumors DNA to calculate recurrence score. This would help determine whether patient has high risk or intermediate risk or low risk breast cancer. She understands that if her tumor was found to be high risk, she would benefit from systemic chemotherapy. If low risk, no need of chemotherapy. If she was found to be intermediate risk, we would need to evaluate the score as well as other risk factors and determine if an abbreviated chemotherapy may be of benefit.  Return to clinic after surgery to discuss final pathology report and then determine if Oncotype DX testing will need to be sent.   All questions were answered. The patient knows to call the clinic with any problems, questions or concerns.    Rulon Eisenmenger, MD 10/24/2015

## 2015-10-26 ENCOUNTER — Encounter (HOSPITAL_BASED_OUTPATIENT_CLINIC_OR_DEPARTMENT_OTHER): Payer: Self-pay | Admitting: *Deleted

## 2015-10-27 ENCOUNTER — Other Ambulatory Visit: Payer: Self-pay | Admitting: Family Medicine

## 2015-10-27 NOTE — Telephone Encounter (Signed)
Pt has CPE scheduled for 01/11/16, last refilled on 04/29/15 #20 tabs with 0 refills please advise

## 2015-10-27 NOTE — Telephone Encounter (Signed)
Px written for call in   

## 2015-10-27 NOTE — Telephone Encounter (Signed)
Rx called in as prescribed 

## 2015-10-28 ENCOUNTER — Encounter: Payer: Self-pay | Admitting: *Deleted

## 2015-10-28 NOTE — Progress Notes (Signed)
[place] Psychosocial Distress Screening Clinical Social Work  Clinical Social Work was referred by distress screening protocol.  The patient scored a 6 on the Psychosocial Distress Thermometer which indicates moderate distress. Clinical Social Worker phoned pt to assess for distress and other psychosocial needs. Pt reports to be doing ok, she is eager to get her treatment plan started. She feels she received answers and information that assisted her at her appointments. She was educated about various Production designer, theatre/television/film available at [facility] to assist her. CSW encouraged pt to reach out as needed. Pt denied other needs and agrees to contact CSW.  ONCBCN DISTRESS SCREENING 10/24/2015  Screening Type Initial Screening  Distress experienced in past week (1-10) 6  Emotional problem type Adjusting to illness  Information Concerns Type Lack of info about diagnosis;Lack of info about treatment  Physician notified of physical symptoms Yes     Clinical Social Worker follow up needed: No.  If yes, follow up plan: Jamie Shaffer, [place]  [facility] Phone: [phone] Fax: [phone]

## 2015-11-01 ENCOUNTER — Ambulatory Visit
Admission: RE | Admit: 2015-11-01 | Discharge: 2015-11-01 | Disposition: A | Discharge status: Home / Self Care | Payer: 59 | Source: Ambulatory Visit | Attending: General Surgery | Admitting: General Surgery

## 2015-11-01 DIAGNOSIS — C50412 Malignant neoplasm of upper-outer quadrant of left female breast: Secondary | ICD-10-CM

## 2015-11-01 NOTE — OR Nursing (Signed)
Boost Breeze drink given. Patient to complete by 5 am on morning of surgery. To arrive for surgery at 7 am. Understanding verbalized by patient. Sherron Monday, RN BSN CAPA, AD/MCSC.

## 2015-11-02 ENCOUNTER — Ambulatory Visit (HOSPITAL_BASED_OUTPATIENT_CLINIC_OR_DEPARTMENT_OTHER): Payer: 59 | Admitting: Anesthesiology

## 2015-11-02 ENCOUNTER — Encounter (HOSPITAL_BASED_OUTPATIENT_CLINIC_OR_DEPARTMENT_OTHER)
Admission: RE | Disposition: A | Discharge status: Home / Self Care | Payer: Self-pay | Source: Ambulatory Visit | Attending: General Surgery

## 2015-11-02 ENCOUNTER — Encounter (HOSPITAL_BASED_OUTPATIENT_CLINIC_OR_DEPARTMENT_OTHER): Payer: Self-pay | Admitting: *Deleted

## 2015-11-02 ENCOUNTER — Ambulatory Visit
Admission: RE | Admit: 2015-11-02 | Discharge: 2015-11-02 | Disposition: A | Discharge status: Home / Self Care | Payer: 59 | Source: Ambulatory Visit | Attending: General Surgery | Admitting: General Surgery

## 2015-11-02 ENCOUNTER — Ambulatory Visit (HOSPITAL_BASED_OUTPATIENT_CLINIC_OR_DEPARTMENT_OTHER)
Admission: RE | Admit: 2015-11-02 | Discharge: 2015-11-02 | Disposition: A | Discharge status: Home / Self Care | Payer: 59 | Source: Ambulatory Visit | Attending: General Surgery | Admitting: General Surgery

## 2015-11-02 ENCOUNTER — Ambulatory Visit (HOSPITAL_COMMUNITY)
Admission: RE | Admit: 2015-11-02 | Discharge: 2015-11-02 | Disposition: A | Discharge status: Home / Self Care | Payer: 59 | Source: Ambulatory Visit | Attending: General Surgery | Admitting: General Surgery

## 2015-11-02 DIAGNOSIS — Z90710 Acquired absence of both cervix and uterus: Secondary | ICD-10-CM | POA: Diagnosis not present

## 2015-11-02 DIAGNOSIS — N6012 Diffuse cystic mastopathy of left breast: Secondary | ICD-10-CM | POA: Diagnosis not present

## 2015-11-02 DIAGNOSIS — C50412 Malignant neoplasm of upper-outer quadrant of left female breast: Secondary | ICD-10-CM

## 2015-11-02 DIAGNOSIS — K219 Gastro-esophageal reflux disease without esophagitis: Secondary | ICD-10-CM | POA: Diagnosis not present

## 2015-11-02 HISTORY — PX: BREAST LUMPECTOMY WITH RADIOACTIVE SEED AND SENTINEL LYMPH NODE BIOPSY: SHX6550

## 2015-11-02 HISTORY — DX: Gastro-esophageal reflux disease without esophagitis: K21.9

## 2015-11-02 HISTORY — PX: BREAST LUMPECTOMY: SHX2

## 2015-11-02 HISTORY — DX: Malignant (primary) neoplasm, unspecified: C80.1

## 2015-11-02 LAB — POCT HEMOGLOBIN-HEMACUE: Hemoglobin: 11.8 g/dL — ABNORMAL LOW (ref 12.0–15.0)

## 2015-11-02 SURGERY — BREAST LUMPECTOMY WITH RADIOACTIVE SEED AND SENTINEL LYMPH NODE BIOPSY
Anesthesia: General | Site: Breast | Laterality: Left

## 2015-11-02 MED ORDER — LACTATED RINGERS IV SOLN
INTRAVENOUS | Status: DC
  Administered 2015-11-02 (×2): via INTRAVENOUS

## 2015-11-02 MED ORDER — DEXAMETHASONE SODIUM PHOSPHATE 4 MG/ML IJ SOLN
INTRAMUSCULAR | Status: DC | PRN
  Administered 2015-11-02: 4 mg via INTRAVENOUS

## 2015-11-02 MED ORDER — LIDOCAINE-EPINEPHRINE (PF) 1 %-1:200000 IJ SOLN
INTRAMUSCULAR | Status: AC
  Filled 2015-11-02: qty 30

## 2015-11-02 MED ORDER — FENTANYL CITRATE (PF) 100 MCG/2ML IJ SOLN
INTRAMUSCULAR | Status: AC
  Filled 2015-11-02: qty 2

## 2015-11-02 MED ORDER — DEXAMETHASONE SODIUM PHOSPHATE 10 MG/ML IJ SOLN
INTRAMUSCULAR | Status: AC
  Filled 2015-11-02: qty 1

## 2015-11-02 MED ORDER — ONDANSETRON HCL 4 MG/2ML IJ SOLN
INTRAMUSCULAR | Status: DC | PRN
  Administered 2015-11-02: 4 mg via INTRAVENOUS

## 2015-11-02 MED ORDER — HYDROMORPHONE HCL 1 MG/ML IJ SOLN
INTRAMUSCULAR | Status: AC
  Filled 2015-11-02: qty 1

## 2015-11-02 MED ORDER — GLYCOPYRROLATE 0.2 MG/ML IJ SOLN
0.2000 mg | Freq: Once | INTRAMUSCULAR | Status: AC | PRN
  Administered 2015-11-02: 0.2 mg via INTRAVENOUS

## 2015-11-02 MED ORDER — MIDAZOLAM HCL 2 MG/2ML IJ SOLN
INTRAMUSCULAR | Status: AC
  Filled 2015-11-02: qty 2

## 2015-11-02 MED ORDER — LIDOCAINE-EPINEPHRINE 1 %-1:100000 IJ SOLN
INTRAMUSCULAR | Status: AC
  Filled 2015-11-02: qty 1

## 2015-11-02 MED ORDER — BUPIVACAINE-EPINEPHRINE (PF) 0.5% -1:200000 IJ SOLN
INTRAMUSCULAR | Status: AC
  Filled 2015-11-02: qty 30

## 2015-11-02 MED ORDER — GLYCOPYRROLATE 0.2 MG/ML IV SOSY
PREFILLED_SYRINGE | INTRAVENOUS | Status: AC
  Filled 2015-11-02: qty 3

## 2015-11-02 MED ORDER — SODIUM BICARBONATE 4 % IV SOLN
INTRAVENOUS | Status: AC
  Filled 2015-11-02: qty 5

## 2015-11-02 MED ORDER — OXYCODONE-ACETAMINOPHEN 5-325 MG PO TABS: 1.0000 | ORAL_TABLET | ORAL | Status: DC | PRN

## 2015-11-02 MED ORDER — 0.9 % SODIUM CHLORIDE (POUR BTL) OPTIME
TOPICAL | Status: DC | PRN
  Administered 2015-11-02: 300 mL

## 2015-11-02 MED ORDER — LIDOCAINE HCL (CARDIAC) 20 MG/ML IV SOLN
INTRAVENOUS | Status: DC | PRN
  Administered 2015-11-02: 80 mg via INTRAVENOUS

## 2015-11-02 MED ORDER — LIDOCAINE 2% (20 MG/ML) 5 ML SYRINGE
INTRAMUSCULAR | Status: AC
  Filled 2015-11-02: qty 5

## 2015-11-02 MED ORDER — ONDANSETRON HCL 4 MG/2ML IJ SOLN
INTRAMUSCULAR | Status: AC
  Filled 2015-11-02: qty 2

## 2015-11-02 MED ORDER — LIDOCAINE-EPINEPHRINE (PF) 1 %-1:200000 IJ SOLN
INTRAMUSCULAR | Status: DC | PRN
  Administered 2015-11-02: 20 mL via SUBCUTANEOUS

## 2015-11-02 MED ORDER — OXYCODONE HCL 5 MG PO TABS: 5.0000 mg | ORAL_TABLET | Freq: Once | ORAL | Status: DC | PRN

## 2015-11-02 MED ORDER — CEFAZOLIN SODIUM-DEXTROSE 2-4 GM/100ML-% IV SOLN
2.0000 g | INTRAVENOUS | Status: AC
  Administered 2015-11-02: 2 g via INTRAVENOUS

## 2015-11-02 MED ORDER — FENTANYL CITRATE (PF) 100 MCG/2ML IJ SOLN
50.0000 ug | INTRAMUSCULAR | Status: DC | PRN
  Administered 2015-11-02 (×2): 50 ug via INTRAVENOUS

## 2015-11-02 MED ORDER — METHYLENE BLUE 0.5 % INJ SOLN
INTRAVENOUS | Status: AC
  Filled 2015-11-02: qty 20

## 2015-11-02 MED ORDER — FENTANYL CITRATE (PF) 100 MCG/2ML IJ SOLN
INTRAMUSCULAR | Status: DC | PRN
  Administered 2015-11-02 (×2): 50 ug via INTRAVENOUS

## 2015-11-02 MED ORDER — TECHNETIUM TC 99M SULFUR COLLOID FILTERED: 1.0000 | Freq: Once | INTRAVENOUS | Status: DC | PRN

## 2015-11-02 MED ORDER — CEFAZOLIN SODIUM-DEXTROSE 2-4 GM/100ML-% IV SOLN
INTRAVENOUS | Status: AC
  Filled 2015-11-02: qty 100

## 2015-11-02 MED ORDER — SODIUM CHLORIDE 0.9 % IJ SOLN
INTRAMUSCULAR | Status: AC
  Filled 2015-11-02: qty 20

## 2015-11-02 MED ORDER — BUPIVACAINE HCL (PF) 0.25 % IJ SOLN
INTRAMUSCULAR | Status: AC
  Filled 2015-11-02: qty 30

## 2015-11-02 MED ORDER — MIDAZOLAM HCL 2 MG/2ML IJ SOLN
1.0000 mg | INTRAMUSCULAR | Status: DC | PRN
  Administered 2015-11-02 (×2): 1 mg via INTRAVENOUS

## 2015-11-02 MED ORDER — PROPOFOL 10 MG/ML IV BOLUS
INTRAVENOUS | Status: AC
  Filled 2015-11-02: qty 40

## 2015-11-02 MED ORDER — MEPERIDINE HCL 25 MG/ML IJ SOLN: 6.2500 mg | INTRAMUSCULAR | Status: DC | PRN

## 2015-11-02 MED ORDER — HYDROMORPHONE HCL 1 MG/ML IJ SOLN
0.2500 mg | INTRAMUSCULAR | Status: DC | PRN
  Administered 2015-11-02 (×2): 0.5 mg via INTRAVENOUS

## 2015-11-02 MED ORDER — PROPOFOL 10 MG/ML IV BOLUS
INTRAVENOUS | Status: DC | PRN
  Administered 2015-11-02: 50 mg via INTRAVENOUS
  Administered 2015-11-02: 100 mg via INTRAVENOUS

## 2015-11-02 MED ORDER — MIDAZOLAM HCL 2 MG/2ML IJ SOLN
INTRAMUSCULAR | Status: DC | PRN
  Administered 2015-11-02: 1 mg via INTRAVENOUS

## 2015-11-02 MED ORDER — SCOPOLAMINE 1 MG/3DAYS TD PT72: 1.0000 | MEDICATED_PATCH | Freq: Once | TRANSDERMAL | Status: DC | PRN

## 2015-11-02 MED ORDER — OXYCODONE HCL 5 MG/5ML PO SOLN: 5.0000 mg | Freq: Once | ORAL | Status: DC | PRN

## 2015-11-02 MED ORDER — BUPIVACAINE-EPINEPHRINE (PF) 0.25% -1:200000 IJ SOLN
INTRAMUSCULAR | Status: AC
  Filled 2015-11-02: qty 60

## 2015-11-02 SURGICAL SUPPLY — 66 items
APPLIER CLIP 9.375 MED OPEN (MISCELLANEOUS)
BINDER BREAST LRG (GAUZE/BANDAGES/DRESSINGS) IMPLANT
BINDER BREAST MEDIUM (GAUZE/BANDAGES/DRESSINGS) IMPLANT
BINDER BREAST XLRG (GAUZE/BANDAGES/DRESSINGS) ×2 IMPLANT
BINDER BREAST XXLRG (GAUZE/BANDAGES/DRESSINGS) IMPLANT
BLADE SURG 10 STRL SS (BLADE) IMPLANT
BLADE SURG 15 STRL LF DISP TIS (BLADE) ×1 IMPLANT
BLADE SURG 15 STRL SS (BLADE) ×1
BNDG COHESIVE 4X5 TAN STRL (GAUZE/BANDAGES/DRESSINGS) ×2 IMPLANT
CANISTER SUC SOCK COL 7IN (MISCELLANEOUS) IMPLANT
CANISTER SUCT 1200ML W/VALVE (MISCELLANEOUS) ×2 IMPLANT
CHLORAPREP W/TINT 26ML (MISCELLANEOUS) ×2 IMPLANT
CLIP APPLIE 9.375 MED OPEN (MISCELLANEOUS) IMPLANT
CLIP TI LARGE 6 (CLIP) ×2 IMPLANT
CLIP TI MEDIUM 6 (CLIP) ×4 IMPLANT
CLIP TI WIDE RED SMALL 6 (CLIP) IMPLANT
COVER MAYO STAND STRL (DRAPES) ×2 IMPLANT
COVER PROBE W GEL 5X96 (DRAPES) ×2 IMPLANT
DECANTER SPIKE VIAL GLASS SM (MISCELLANEOUS) ×2 IMPLANT
DERMABOND ADVANCED (GAUZE/BANDAGES/DRESSINGS) ×1
DERMABOND ADVANCED .7 DNX12 (GAUZE/BANDAGES/DRESSINGS) ×1 IMPLANT
DEVICE DUBIN W/COMP PLATE 8390 (MISCELLANEOUS) ×2 IMPLANT
DRAPE UTILITY XL STRL (DRAPES) ×2 IMPLANT
DRSG PAD ABDOMINAL 8X10 ST (GAUZE/BANDAGES/DRESSINGS) IMPLANT
ELECT COATED BLADE 2.86 ST (ELECTRODE) ×2 IMPLANT
ELECT REM PT RETURN 9FT ADLT (ELECTROSURGICAL) ×2
ELECTRODE REM PT RTRN 9FT ADLT (ELECTROSURGICAL) ×1 IMPLANT
GLOVE BIO SURGEON STRL SZ 6 (GLOVE) ×2 IMPLANT
GLOVE BIO SURGEON STRL SZ 6.5 (GLOVE) ×2 IMPLANT
GLOVE BIOGEL PI IND STRL 6.5 (GLOVE) ×1 IMPLANT
GLOVE BIOGEL PI IND STRL 7.0 (GLOVE) ×1 IMPLANT
GLOVE BIOGEL PI INDICATOR 6.5 (GLOVE) ×1
GLOVE BIOGEL PI INDICATOR 7.0 (GLOVE) ×1
GOWN STRL REUS W/ TWL LRG LVL3 (GOWN DISPOSABLE) ×1 IMPLANT
GOWN STRL REUS W/TWL 2XL LVL3 (GOWN DISPOSABLE) ×2 IMPLANT
GOWN STRL REUS W/TWL LRG LVL3 (GOWN DISPOSABLE) ×1
ILLUMINATOR WAVEGUIDE N/F (MISCELLANEOUS) IMPLANT
KIT MARKER MARGIN INK (KITS) ×2 IMPLANT
LIGHT WAVEGUIDE WIDE FLAT (MISCELLANEOUS) ×2 IMPLANT
NDL SAFETY ECLIPSE 18X1.5 (NEEDLE) IMPLANT
NEEDLE HYPO 18GX1.5 SHARP (NEEDLE)
NEEDLE HYPO 25X1 1.5 SAFETY (NEEDLE) ×2 IMPLANT
NS IRRIG 1000ML POUR BTL (IV SOLUTION) ×2 IMPLANT
PACK BASIN DAY SURGERY FS (CUSTOM PROCEDURE TRAY) ×2 IMPLANT
PACK UNIVERSAL I (CUSTOM PROCEDURE TRAY) ×2 IMPLANT
PENCIL BUTTON HOLSTER BLD 10FT (ELECTRODE) ×2 IMPLANT
SLEEVE SCD COMPRESS KNEE MED (MISCELLANEOUS) ×2 IMPLANT
SPONGE GAUZE 4X4 12PLY STER LF (GAUZE/BANDAGES/DRESSINGS) ×2 IMPLANT
SPONGE LAP 18X18 X RAY DECT (DISPOSABLE) ×4 IMPLANT
STAPLER VISISTAT 35W (STAPLE) IMPLANT
STOCKINETTE IMPERVIOUS LG (DRAPES) ×2 IMPLANT
STRIP CLOSURE SKIN 1/2X4 (GAUZE/BANDAGES/DRESSINGS) ×2 IMPLANT
SUT ETHILON 2 0 FS 18 (SUTURE) IMPLANT
SUT MNCRL AB 4-0 PS2 18 (SUTURE) ×2 IMPLANT
SUT MON AB 5-0 PS2 18 (SUTURE) IMPLANT
SUT SILK 2 0 SH (SUTURE) IMPLANT
SUT VIC AB 2-0 SH 27 (SUTURE) ×1
SUT VIC AB 2-0 SH 27XBRD (SUTURE) ×1 IMPLANT
SUT VIC AB 3-0 SH 27 (SUTURE) ×1
SUT VIC AB 3-0 SH 27X BRD (SUTURE) ×1 IMPLANT
SUT VIC AB 5-0 PS2 18 (SUTURE) IMPLANT
SYR CONTROL 10ML LL (SYRINGE) ×2 IMPLANT
TOWEL OR 17X24 6PK STRL BLUE (TOWEL DISPOSABLE) ×2 IMPLANT
TOWEL OR NON WOVEN STRL DISP B (DISPOSABLE) ×2 IMPLANT
TUBE CONNECTING 20X1/4 (TUBING) ×2 IMPLANT
YANKAUER SUCT BULB TIP NO VENT (SUCTIONS) ×2 IMPLANT

## 2015-11-02 NOTE — Transfer of Care (Signed)
Immediate Anesthesia Transfer of Care Note  Patient: Jamie Shaffer  Procedure(s) Performed: Procedure(s): BREAST LUMPECTOMY WITH RADIOACTIVE SEED AND SENTINEL LYMPH NODE BIOPSY (Left)  Patient Location: PACU  Anesthesia Type:General  Level of Consciousness: awake, alert  and oriented  Airway & Oxygen Therapy: Patient Spontanous Breathing and Patient connected to face mask oxygen  Post-op Assessment: Report given to RN and Post -op Vital signs reviewed and stable  Post vital signs: Reviewed and stable  Last Vitals:  Filed Vitals:   11/02/15 0859 11/02/15 0900  BP:  123/59  Pulse: 82 75  Temp:    Resp: 13 15    Last Pain: There were no vitals filed for this visit.    Patients Stated Pain Goal: 0 ([phone]  Complications: No apparent anesthesia complications

## 2015-11-02 NOTE — Op Note (Signed)
Left Breast Radioactive seed localized lumpectomy and sentinel lymph node biopsy  Indications: This patient presents with history of left breast cancer, upper outer quadrant, cT1cN0  Pre-operative Diagnosis: Left breast cancer  Post-operative Diagnosis: Same  Surgeon: [name]   Anesthesia: General endotracheal anesthesia  ASA Class: 2  Procedure Details  The patient was seen in the Holding Room. The risks, benefits, complications, treatment options, and expected outcomes were discussed with the patient. The possibilities of bleeding, infection, the need for additional procedures, failure to diagnose a condition, and creating a complication requiring transfusion or operation were discussed with the patient. The patient concurred with the proposed plan, giving informed consent.  The site of surgery properly noted/marked. The patient was taken to Operating Room # 8, identified, and the procedure verified as Left seed localized Breast Lumpectomy with SLN bx. A Time Out was held and the above information confirmed.  The left arm, breast, and chest were prepped and draped in standard fashion. The lumpectomy was performed by creating an axillary incision between the signal for the axilla and the seed.  Dissection was carried down to around point of maximum signal intensity. The cautery was used to perform the dissection.  Hemostasis was achieved with cautery. The edges of the cavity were marked with large clips, with one each medial, lateral, inferior and superior, and two clips posteriorly.   The specimen was inked with the margin marker paint kit.    Specimen radiography confirmed inclusion of the mammographic lesion, the clip, and the seed.  The background iodine signal in the breast was zero.     Using a hand-held gamma probe, left axillary sentinel nodes were identified transcutaneously.   Dissection was carried through the clavipectoral fascia.  Two level two axillary sentinel nodes were  removed.  Counts per second were 1240 and 320.    The background technicium count was 12 cps.  The wound was irrigated.  Hemostasis was achieved with cautery.  The axillary incision was closed with a 3-0 vicryl deep dermal interrupted sutures and a 4-0 monocryl subcuticular closure.    Sterile dressings were applied. At the end of the operation, all sponge, instrument, and needle counts were correct.  Findings: grossly clear surgical margins and no adenopathy.  Posterior margin is pectoralis, anterior margin is skin.    Estimated Blood Loss:  min         Specimens: Left  breast lumpectomy and two left axillary sentinel lymph nodes.             Complications:  None; patient tolerated the procedure well.         Disposition: PACU - hemodynamically stable.         Condition: stable

## 2015-11-02 NOTE — Anesthesia Postprocedure Evaluation (Signed)
Anesthesia Post Note  Patient: Jamie Shaffer  Procedure(s) Performed: Procedure(s) (LRB): BREAST LUMPECTOMY WITH RADIOACTIVE SEED AND SENTINEL LYMPH NODE BIOPSY (Left)  Patient location during evaluation: PACU Anesthesia Type: General Level of consciousness: awake and alert Pain management: pain level controlled Vital Signs Assessment: post-procedure vital signs reviewed and stable Respiratory status: spontaneous breathing, nonlabored ventilation and respiratory function stable Cardiovascular status: blood pressure returned to baseline and stable Postop Assessment: no signs of nausea or vomiting Anesthetic complications: no    Last Vitals:  Filed Vitals:   11/02/15 1130 11/02/15 1140  BP: 125/77 121/78  Pulse: 62 68  Temp:  36.4 C  Resp: 12 16    Last Pain:  Filed Vitals:   11/02/15 1141  PainSc: 4                  Calissa Swenor A

## 2015-11-02 NOTE — Interval H&P Note (Signed)
History and Physical Interval Note:  11/02/2015 8:47 AM  Jamie Shaffer  has presented today for surgery, with the diagnosis of LEFT BREAST CANCER  The various methods of treatment have been discussed with the patient and family. After consideration of risks, benefits and other options for treatment, the patient has consented to  Procedure(s): BREAST LUMPECTOMY WITH RADIOACTIVE SEED AND SENTINEL LYMPH NODE BIOPSY (Left) as a surgical intervention .  The patient's history has been reviewed, patient examined, no change in status, stable for surgery.  I have reviewed the patient's chart and labs.  Questions were answered to the patient's satisfaction.     Meric [name]

## 2015-11-02 NOTE — Progress Notes (Signed)
Assisted Dr. Crews with left, ultrasound guided, pectoralis block. Side rails up, monitors on throughout procedure. See vital signs in flow sheet. Tolerated Procedure well. 

## 2015-11-02 NOTE — H&P (Deleted)
Jamie Shaffer 10/03/2015 11:47 AM Location: [facility] Patient #: [id number] DOB: 10/29/1942 Divorced / Language: [name] / Race: White Female   History of Present Illness Jamie Klein MD; 10/03/2015 3:25 PM) Patient words: reck.  The patient is a 55 year old female who presents with breast cancer. Patient is a 55 year old female who presents with a screening detected right breast cancer. She has a history of left breast cancer at age 70. She had a left mastectomy with axillary lymph node dissection at that time. She received preoperative radiation and postoperative chemotherapy. She thinks she had around a third of her 22 lymph nodes positive. Her diagnostic mammography shows a 2 cm irregular mass with the microlobulated margins and calcifications in the upper right breast. This new right breast cancer is 2-1/2 cm in length on the right at 9:00. 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 Prognostic panel is pending.   She has significant family history with 2 sisters with breast cancer at age 19 and 22. She has 2 paternal aunts with breast cancer as well. The patient did undergo genetic testing several years ago when her niece wanted to have testing. At that time, testing was negative. It is not clear exactly what she was tested for, but presumably this was BRCA 1 and 2.    Other Problems Marjean Donna, CMA; 10/03/2015 11:47 AM) Breast Cancer High blood pressure Hypercholesterolemia  Past Surgical History Marjean Donna, CMA; 10/03/2015 11:47 AM) Breast Biopsy Right. Breast Reconstruction Left. Cataract Surgery Left. Colon Polyp Removal - Colonoscopy Mastectomy Left.  Diagnostic Studies History Marjean Donna, CMA; 10/03/2015 11:47 AM) Colonoscopy 5-10 years ago Mammogram within last year  Allergies Marjean Donna, [place]; 10/03/2015 11:48 AM) No Known Drug Allergies04/17/2017  Medication History (Sonya  Bynum, CMA; 10/03/2015 11:50 AM) Atorvastatin Calcium (10MG Tablet, Oral) Active. Lisinopril (10MG Tablet, Oral) Active. Aspirin (81MG Tablet, Oral) Active. Fish Oil + D3 (1000-1000MG-UNIT Capsule, Oral) Active. Multivitamin Adult (Oral) Active. Medications Reconciled  Social History Marjean Donna, CMA; 10/03/2015 11:47 AM) Alcohol use Moderate alcohol use. Caffeine use Coffee. No drug use Tobacco use Never smoker.  Family History Marjean Donna, [place]; 10/03/2015 11:47 AM) Arthritis Sister. Breast Cancer Sister. Hypertension Mother.  Pregnancy / Birth History Marjean Donna, [place]; 10/03/2015 11:47 AM) Age at menarche 27 years. Gravida 0 Irregular periods Para 0    Review of Systems (Sonya Bynum CMA; 10/03/2015 11:47 AM) General Not Present- Appetite Loss, Chills, Fatigue, Fever, Night Sweats, Weight Gain and Weight Loss. Skin Not Present- Change in Wart/Mole, Dryness, Hives, Jaundice, New Lesions, Non-Healing Wounds, Rash and Ulcer. HEENT Present- Wears glasses/contact lenses. Not Present- Earache, Hearing Loss, Hoarseness, Nose Bleed, Oral Ulcers, Ringing in the Ears, Seasonal Allergies, Sinus Pain, Sore Throat, Visual Disturbances and Yellow Eyes. Respiratory Not Present- Bloody sputum, Chronic Cough, Difficulty Breathing, Snoring and Wheezing. Cardiovascular Present- Leg Cramps. Not Present- Chest Pain, Difficulty Breathing Lying Down, Palpitations, Rapid Heart Rate, Shortness of Breath and Swelling of Extremities. Gastrointestinal Not Present- Abdominal Pain, Bloating, Bloody Stool, Change in Bowel Habits, Chronic diarrhea, Constipation, Difficulty Swallowing, Excessive gas, Gets full quickly at meals, Hemorrhoids, Indigestion, Nausea, Rectal Pain and Vomiting. Female Genitourinary Not Present- Frequency, Nocturia, Painful Urination, Pelvic Pain and Urgency. Neurological Not Present- Decreased Memory, Fainting, Headaches, Numbness, Seizures, Tingling, Tremor, Trouble  walking and Weakness.  Vitals (Sonya Bynum CMA; 10/03/2015 11:48 AM) 10/03/2015 11:48 AM Weight: 214 lb Height: 63in Body Surface Area: 1.99 m Body Mass Index: 37.91 kg/m  Temp.: 78F(Temporal)  Pulse: 76 (Regular)  BP: 124/80 (Sitting, Left Arm, Standard)       Physical Exam Jamie Klein MD; 10/03/2015 3:26 PM) General Mental Status-Alert. General Appearance-Consistent with stated age. Hydration-Well hydrated. Voice-Normal.  Head and Neck Head-normocephalic, atraumatic with no lesions or palpable masses. Trachea-midline. Thyroid Gland Characteristics - normal size and consistency.  Eye Eyeball - Bilateral-Extraocular movements intact. Sclera/Conjunctiva - Bilateral-No scleral icterus.  Chest and Lung Exam Chest and lung exam reveals -quiet, even and easy respiratory effort with no use of accessory muscles and on auscultation, normal breath sounds, no adventitious sounds and normal vocal resonance. Inspection Chest Wall - Normal. Back - normal.  Breast Note: left breast is surgically reconstructed. Right breast is ptotic. ? palpable mass deep in the right breast at 9 o'clock. Mobile. no chest wall masses on left.   Cardiovascular Cardiovascular examination reveals -normal heart sounds, regular rate and rhythm with no murmurs and normal pedal pulses bilaterally.  Abdomen Inspection Inspection of the abdomen reveals - No Hernias. Palpation/Percussion Palpation and Percussion of the abdomen reveal - Soft, Non Tender, No Rebound tenderness, No Rigidity (guarding) and No hepatosplenomegaly. Auscultation Auscultation of the abdomen reveals - Bowel sounds normal.  Neurologic Neurologic evaluation reveals -alert and oriented x 3 with no impairment of recent or remote memory. Mental Status-Normal.  Musculoskeletal Global Assessment -Note: no gross deformities.  Normal Exam - Left-Upper Extremity Strength Normal and Lower  Extremity Strength Normal. Normal Exam - Right-Upper Extremity Strength Normal and Lower Extremity Strength Normal.  Lymphatic Head & Neck  General Head & Neck Lymphatics: Bilateral - Description - Normal. Axillary  General Axillary Region: Bilateral - Description - Normal. Tenderness - Non Tender. Femoral & Inguinal  Generalized Femoral & Inguinal Lymphatics: Bilateral - Description - No Generalized lymphadenopathy.    Assessment & Plan Jamie Klein MD; 10/03/2015 3:29 PM) PRIMARY CANCER OF UPPER OUTER QUADRANT OF RIGHT FEMALE BREAST (C50.411) Impression: Patient originally wanted a mastectomy, but now wants a lumpectomy if she is a candidate for it. I discussed seed localized right lumpectomy with SLN bx. She will need referral to radiation oncology and medical oncology.  Additional treatment is dependent on prognostic panel and possible oncotype.  The surgical procedure was described to the patient. I discussed the incision type and location and that we would need radiology involved on with a seed marker and sentinel node.  The risks and benefits of the procedure were described to the patient and she wishes to proceed.  We discussed the risks bleeding, infection, damage to other structures, need for further procedures/surgeries. We discussed the risk of seroma. The patient was advised if the area in the breast in cancer, we may need to go back to surgery for additional tissue to obtain negative margins or for a lymph node biopsy. The patient was advised that these are the most common complications, but that others can occur as well. They were advised against taking aspirin or other anti-inflammatory agents/blood thinners the week before surgery.  I will also send her back to genetics given her family history. There are updates yearly on genetics.  45 min spent in evaluation, examination, counseling, and coordination of care. >50% spent in counseling. Current Plans You are being  scheduled for surgery - Our schedulers will call you.  You should hear from our office's scheduling department within 5 working days about the location, date, and time of surgery. We try to make accommodations for patient's preferences in scheduling surgery, but sometimes the OR  schedule or the surgeon's schedule prevents [place] from making those accommodations.  If you have not heard from our office [phone]) in 5 working days, call the office and ask for your surgeon's nurse.  If you have other questions about your diagnosis, plan, or surgery, call the office and ask for your surgeon's nurse.  Pt Education - [facility] Breast Cancer Information Given - [facility] "Breast Journey" Package Pt Education - [facility] breast cancer surgery: discussed with patient and provided information. Referred to Radiation Oncology, for evaluation and follow up (Radiation Oncology). Routine. Referred to Oncology, for evaluation and follow up (Oncology). Routine. Referred to Genetic Counseling, for evaluation and follow up [employer]). Routine.   Signed by Jamie Klein, MD (10/03/2015 3:30 PM)

## 2015-11-02 NOTE — Discharge Instructions (Addendum)
[place] Office Phone Number [phone]  BREAST BIOPSY/ LUMPECTOMY: POST OP INSTRUCTIONS  Always review your discharge instruction sheet given to you by the facility where your surgery was performed.  IF YOU HAVE DISABILITY OR FAMILY LEAVE FORMS, YOU MUST BRING THEM TO THE OFFICE FOR PROCESSING.  DO NOT GIVE THEM TO YOUR DOCTOR.  1. A prescription for pain medication may be given to you upon discharge.  Take your pain medication as prescribed, if needed.  If narcotic pain medicine is not needed, then you may take acetaminophen (Tylenol) or ibuprofen (Advil) as needed. 2. Take your usually prescribed medications unless otherwise directed 3. If you need a refill on your pain medication, please contact your pharmacy.  They will contact our office to request authorization.  Prescriptions will not be filled after 5pm or on week-ends. 4. You should eat very light the first 24 hours after surgery, such as soup, crackers, pudding, etc.  Resume your normal diet the day after surgery. 5. Most patients will experience some swelling and bruising in the breast.  Ice packs and a good support bra will help.  Swelling and bruising can take several days to resolve.  6. It is common to experience some constipation if taking pain medication after surgery.  Increasing fluid intake and taking a stool softener will usually help or prevent this problem from occurring.  A mild laxative (Milk of Magnesia or Miralax) should be taken according to package directions if there are no bowel movements after 48 hours. 7. Unless discharge instructions indicate otherwise, you may remove your bandages 48 hours after surgery, and you may shower at that time.  You may have steri-strips (small skin tapes) in place directly over the incision.  These strips should be left on the skin for 7-10 days.   Any sutures or staples will be removed at the office during your follow-up visit. 8. ACTIVITIES:  You may resume regular  daily activities (gradually increasing) beginning the next day.  Wearing a good support bra or sports bra (or the breast binder) minimizes pain and swelling.  You may have sexual intercourse when it is comfortable. a. You may drive when you no longer are taking prescription pain medication, you can comfortably wear a seatbelt, and you can safely maneuver your car and apply brakes. b. RETURN TO WORK:  __________1 week_______________ 9. You should see your doctor in the office for a follow-up appointment approximately two weeks after your surgery.  Your doctors nurse will typically make your follow-up appointment when she calls you with your pathology report.  Expect your pathology report 2-3 business days after your surgery.  You may call to check if you do not hear from [place] after three days.   WHEN TO CALL YOUR DOCTOR: 1. Fever over 101.0 2. Nausea and/or vomiting. 3. Extreme swelling or bruising. 4. Continued bleeding from incision. 5. Increased pain, redness, or drainage from the incision.  The clinic staff is available to answer your questions during regular business hours.  Please dont hesitate to call and ask to speak to one of the nurses for clinical concerns.  If you have a medical emergency, go to the nearest emergency room or call 911.  A surgeon from [facility] is always on call at the hospital.  For further questions, please visit [email or web address]    Post Anesthesia Home Care Instructions  Activity: Get plenty of rest for the remainder of the day. A responsible adult should stay with you for 24 hours  following the procedure.  For the next 24 hours, DO NOT: -Drive a car -Paediatric nurse -Drink alcoholic beverages -Take any medication unless instructed by your physician -Make any legal decisions or sign important papers.  Meals: Start with liquid foods such as gelatin or soup. Progress to regular foods as tolerated. Avoid greasy, spicy, heavy foods.  If nausea and/or vomiting occur, drink only clear liquids until the nausea and/or vomiting subsides. Call your physician if vomiting continues.  Special Instructions/Symptoms: Your throat may feel dry or sore from the anesthesia or the breathing tube placed in your throat during surgery. If this causes discomfort, gargle with warm salt water. The discomfort should disappear within 24 hours.  If you had a scopolamine patch placed behind your ear for the management of post- operative nausea and/or vomiting:  1. The medication in the patch is effective for 72 hours, after which it should be removed.  Wrap patch in a tissue and discard in the trash. Wash hands thoroughly with soap and water. 2. You may remove the patch earlier than 72 hours if you experience unpleasant side effects which may include dry mouth, dizziness or visual disturbances. 3. Avoid touching the patch. Wash your hands with soap and water after contact with the patch.   Regional Anesthesia Blocks  1. Numbness or the inability to move the "blocked" extremity may last from 3-48 hours after placement. The length of time depends on the medication injected and your individual response to the medication. If the numbness is not going away after 48 hours, call your surgeon.  2. The extremity that is blocked will need to be protected until the numbness is gone and the  Strength has returned. Because you cannot feel it, you will need to take extra care to avoid injury. Because it may be weak, you may have difficulty moving it or using it. You may not know what position it is in without looking at it while the block is in effect.  3. For blocks in the legs and feet, returning to weight bearing and walking needs to be done carefully. You will need to wait until the numbness is entirely gone and the strength has returned. You should be able to move your leg and foot normally before you try and bear weight or walk. You will need someone to be with  you when you first try to ensure you do not fall and possibly risk injury.  4. Bruising and tenderness at the needle site are common side effects and will resolve in a few days.  5. Persistent numbness or new problems with movement should be communicated to the surgeon or the [place] [phone] [place] [phone]).

## 2015-11-02 NOTE — H&P (Signed)
Jamie Shaffer 10/03/2015 11:04 AM Location: [facility] Patient #: [id number] DOB: 10-29-1960 Married / Language: English / Race: Black or African American Female   History of Present Illness [name] MD; 10/03/2015 3:50 PM) Patient words: breast eval.  The patient is a 55 year old female who presents with breast cancer. Patient is a 55 year old female who presents in consultation at the request of Dr. Augustin Coupe for new diagnosis of left breast cancer. The patient underwent screening mammography and was found to have a 1.2 cm mass at 2:00 on imaging. This corresponded with a subtle palpable area of firmness 7 cm from the nipple. This was 1.2 x 0.8 x 0.8 cm. There is also breast density category C tissue. Core needle biopsy demonstrated grade 1 invasive ductal carcinoma with DCIS. Prognostic panel is pending. An MRI was recommended by radiology based on the breast density and strong family history of cancer. The patient's mother and 3 sisters have a history of breast cancer. 2 of her siblings had cancer in their 19s. She was 10 at menarche. She is G3 P2 with her first child in her late teens. She has had a partial hysterectomy. The patient has some breast pain since her biopsy, but no significant breast symptoms prior to that. She is accompanied by her husband.  dx mammogram/ultrasound On physical exam, there is a very subtle palpable area of firmness in the 2 o'clock position of the left breast approximately 7 cm from the nipple.  Targeted ultrasound is performed, showing an irregular hypoechoic mass with vascularity is imaged to 2 o'clock position 7 cm from the nipple. This mass measures 1.2 x 0.8 x 0.8 cm. Additional ultrasound of the upper-outer left breast shows heterogeneously dense breast parenchyma without definite additional masses.  Ultrasound of the left axilla shows normal appearing axillary nodes. No lymphadenopathy.  IMPRESSION: 1.2 cm suspicious  mass at 2 o'clock position left breast 7 cm from the nipple.  RECOMMENDATION: Ultrasound-guided core needle biopsy is recommended and has been scheduled for April 12 at 1:30 p.m. Given the patient's dense breast parenchyma and strong family history of breast cancer, bilateral breast MRI is suggested, regardless of the biopsy results.  Pathology Breast, left, needle core biopsy, 2:00 o'clock - INVASIVE DUCTAL CARCINOMA. - DUCTAL CARCINOMA IN SITU. - SEE COMMENT. Microscopic Comment The carcinoma appears grade 1   Other Problems Marjean Donna, CMA; 10/03/2015 11:04 AM) Hemorrhoids  Past Surgical History Davy Pique Bynum, CMA; 10/03/2015 11:04 AM) Breast Biopsy Left. Cesarean Section - Multiple Hysterectomy (not due to cancer) - Partial  Diagnostic Studies History Marjean Donna, CMA; 10/03/2015 11:04 AM) Colonoscopy 1-5 years ago Mammogram within last year Pap Smear 1-5 years ago  Allergies Davy Pique Bynum, Blyn; 10/03/2015 11:05 AM) No Known Drug Allergies04/17/2017  Medication History (Sonya Bynum, CMA; 10/03/2015 11:05 AM) No Current Medications Medications Reconciled  Social History Marjean Donna, CMA; 10/03/2015 11:04 AM) Caffeine use Coffee. No alcohol use No drug use Tobacco use Never smoker.  Family History Marjean Donna, [place]; 10/03/2015 11:04 AM) Breast Cancer Mother, Sister. Diabetes Mellitus Mother. Heart Disease Father, Mother. Hypertension Father, Mother, Sister. Kidney Disease Father. Prostate Cancer Father.  Pregnancy / Birth History Marjean Donna, [place]; 10/03/2015 11:04 AM) Age at menarche 57 years. Gravida 3 Maternal age 46-20 Para 2    Review of Systems (Watson; 10/03/2015 11:04 AM) General Present- Night Sweats. Not Present- Appetite Loss, Chills, Fatigue, Fever, Weight Gain and Weight Loss. Skin Not Present- Change in Wart/Mole, Dryness, Hives, Jaundice, New  Lesions, Non-Healing Wounds, Rash and Ulcer. HEENT Not Present-  Earache, Hearing Loss, Hoarseness, Nose Bleed, Oral Ulcers, Ringing in the Ears, Seasonal Allergies, Sinus Pain, Sore Throat, Visual Disturbances, Wears glasses/contact lenses and Yellow Eyes. Respiratory Not Present- Bloody sputum, Chronic Cough, Difficulty Breathing, Snoring and Wheezing. Breast Present- Breast Mass. Not Present- Breast Pain, Nipple Discharge and Skin Changes. Cardiovascular Not Present- Chest Pain, Difficulty Breathing Lying Down, Leg Cramps, Palpitations, Rapid Heart Rate, Shortness of Breath and Swelling of Extremities. Gastrointestinal Not Present- Abdominal Pain, Bloating, Bloody Stool, Change in Bowel Habits, Chronic diarrhea, Constipation, Difficulty Swallowing, Excessive gas, Gets full quickly at meals, Hemorrhoids, Indigestion, Nausea, Rectal Pain and Vomiting. Female Genitourinary Not Present- Frequency, Nocturia, Painful Urination, Pelvic Pain and Urgency. Musculoskeletal Not Present- Back Pain, Joint Pain, Joint Stiffness, Muscle Pain, Muscle Weakness and Swelling of Extremities. Neurological Not Present- Decreased Memory, Fainting, Headaches, Numbness, Seizures, Tingling, Tremor, Trouble walking and Weakness. Psychiatric Not Present- Anxiety, Bipolar, Change in Sleep Pattern, Depression, Fearful and Frequent crying. Endocrine Present- Hot flashes. Not Present- Cold Intolerance, Excessive Hunger, Hair Changes, Heat Intolerance and New Diabetes. Hematology Not Present- Easy Bruising, Excessive bleeding, Gland problems, HIV and Persistent Infections.  Vitals (Sonya Bynum CMA; 10/03/2015 11:05 AM) 10/03/2015 11:04 AM Weight: 170 lb Height: 63in Body Surface Area: 1.8 m Body Mass Index: 30.11 kg/m  Pulse: 75 (Regular)  BP: 130/70 (Sitting, Left Arm, Standard)       Physical Exam [name] MD; 10/03/2015 3:51 PM) General Mental Status-Alert. General Appearance-Consistent with stated age. Hydration-Well hydrated. Voice-Normal.  Head and  Neck Head-normocephalic, atraumatic with no lesions or palpable masses. Trachea-midline. Thyroid Gland Characteristics - normal size and consistency.  Eye Eyeball - Bilateral-Extraocular movements intact. Sclera/Conjunctiva - Bilateral-No scleral icterus.  Chest and Lung Exam Chest and lung exam reveals -quiet, even and easy respiratory effort with no use of accessory muscles and on auscultation, normal breath sounds, no adventitious sounds and normal vocal resonance. Inspection Chest Wall - Normal. Back - normal.  Breast Note: breasts are symmetric bilaterally. Ptotic. No palpable masses. left upper outer quadrant is tender at biopsy site. No nipple retraction. no lymphadenopathy. Right breast without abnormalities. Breasts are dense bilaterally   Cardiovascular Cardiovascular examination reveals -normal heart sounds, regular rate and rhythm with no murmurs and normal pedal pulses bilaterally.  Abdomen Inspection Inspection of the abdomen reveals - No Hernias. Palpation/Percussion Palpation and Percussion of the abdomen reveal - Soft, Non Tender, No Rebound tenderness, No Rigidity (guarding) and No hepatosplenomegaly. Auscultation Auscultation of the abdomen reveals - Bowel sounds normal.  Neurologic Neurologic evaluation reveals -alert and oriented x 3 with no impairment of recent or remote memory. Mental Status-Normal.  Musculoskeletal Global Assessment -Note: no gross deformities.  Normal Exam - Left-Upper Extremity Strength Normal and Lower Extremity Strength Normal. Normal Exam - Right-Upper Extremity Strength Normal and Lower Extremity Strength Normal.  Lymphatic Head & Neck  General Head & Neck Lymphatics: Bilateral - Description - Normal. Axillary  General Axillary Region: Bilateral - Description - Normal. Tenderness - Non Tender. Femoral & Inguinal  Generalized Femoral & Inguinal Lymphatics: Bilateral - Description - No Generalized  lymphadenopathy.    Assessment & Plan [name] MD; 10/03/2015 3:53 PM) BREAST CANCER OF UPPER-OUTER QUADRANT OF LEFT FEMALE BREAST (C50.412) Impression: The patient has a new diagnosis of clinical T1 N0 left breast cancer. I will order an MRI based on her breast density and family history. I'll also awaiting her prognostic panel. Because of her family history, I'm also going  to order a urgent genetics consult. If she were to have a BRCA1 or 2 defect, then I would recommend bilateral mastectomies for her.  I'm also going to refer her for radiation oncology and medical oncology. If she has a HER-2 positive or a triple negative tumor, she will likely need a Port-A-Cath. If she does not have these factors, she will probably have an Oncotype.  The patient expresses desire to have a lumpectomy if she does not have an unknown genetic defect. I will set her up for a CT localized left lumpectomy with sentinel lymph node biopsy. The surgical procedure was described to the patient. I discussed the incision type and location and that we would need radiology involved on with a wire or seed marker and/or sentinel node.  The risks and benefits of the procedure were described to the patient and she wishes to proceed.  We discussed the risks bleeding, infection, damage to other structures, need for further procedures/surgeries. We discussed the risk of seroma. The patient was advised if the area in the breast in cancer, we may need to go back to surgery for additional tissue to obtain negative margins or for a lymph node biopsy. The patient was advised that these are the most common complications, but that others can occur as well. They were advised against taking aspirin or other anti-inflammatory agents/blood thinners the week before surgery.  60 min spent in evaluation, examination, counseling, and coordination of care. >50% spent in counseling. Current Plans Referred to Oncology, for evaluation and follow  up (Oncology). Routine. Referred to Radiation Oncology, for evaluation and follow up (Radiation Oncology). Routine. Referred to Genetic Counseling, for evaluation and follow up [employer]). STAT. Pt Education - [facility] Breast Cancer Information Given - [facility] "Breast Journey" Package Pt Education - flb breast cancer surgery: discussed with patient and provided information. You are being scheduled for surgery - Our schedulers will call you.  You should hear from our office's scheduling department within 5 working days about the location, date, and time of surgery. We try to make accommodations for patient's preferences in scheduling surgery, but sometimes the OR schedule or the surgeon's schedule prevents [place] from making those accommodations.  If you have not heard from our office [phone]) in 5 working days, call the office and ask for your surgeon's nurse.  If you have other questions about your diagnosis, plan, or surgery, call the office and ask for your surgeon's nurse.    Signed by [name], MD (10/03/2015 3:53 PM)

## 2015-11-02 NOTE — Progress Notes (Signed)
Emotional support during breast injections °

## 2015-11-02 NOTE — Anesthesia Procedure Notes (Addendum)
Anesthesia Regional Block:  Pectoralis block  Pre-Anesthetic Checklist: ,, timeout performed, Correct Patient, Correct Site, Correct Laterality, Correct Procedure, Correct Position, site marked, Risks and benefits discussed,  Surgical consent,  Pre-op evaluation,  At surgeon's request and post-op pain management  Laterality: Left and Upper  Prep: chloraprep       Needles:  Injection technique: Single-shot  Needle Type: Echogenic Needle     Needle Length: 9cm 9 cm Needle Gauge: 21 and 21 G    Additional Needles:  Procedures: ultrasound guided (picture in chart) Pectoralis block Narrative:  Start time: 11/02/2015 8:55 AM End time: 11/02/2015 8:59 AM Injection made incrementally with aspirations every 5 mL.  Performed by: Personally  Anesthesiologist: CREWS, DAVID   Procedure Name: LMA Insertion Date/Time: 11/02/2015 9:21 AM Performed by: Rayvon Char Pre-anesthesia Checklist: Patient identified, Emergency Drugs available, Suction available and Patient being monitored Patient Re-evaluated:Patient Re-evaluated prior to inductionOxygen Delivery Method: Circle system utilized Preoxygenation: Pre-oxygenation with 100% oxygen Intubation Type: IV induction Ventilation: Mask ventilation without difficulty LMA: LMA inserted LMA Size: 4.0 Placement Confirmation: positive ETCO2 and breath sounds checked- equal and bilateral Dental Injury: Teeth and Oropharynx as per pre-operative assessment       Left PEC block image

## 2015-11-02 NOTE — Anesthesia Preprocedure Evaluation (Signed)
Anesthesia Evaluation  Patient identified by MRN, date of birth, ID band Patient awake    Reviewed: Allergy & Precautions, NPO status   Airway Mallampati: I  TM Distance: >3 FB Neck ROM: Full    Dental  (+) Teeth Intact, Dental Advisory Given   Pulmonary    breath sounds clear to auscultation       Cardiovascular  Rhythm:Regular Rate:Normal     Neuro/Psych    GI/Hepatic GERD  Medicated,  Endo/Other    Renal/GU      Musculoskeletal   Abdominal   Peds  Hematology   Anesthesia Other Findings   Reproductive/Obstetrics                             Anesthesia Physical Anesthesia Plan  ASA: II  Anesthesia Plan: General   Post-op Pain Management: GA combined w/ Regional for post-op pain   Induction: Intravenous  Airway Management Planned: LMA  Additional Equipment:   Intra-op Plan:   Post-operative Plan: Extubation in OR  Informed Consent: I have reviewed the patients History and Physical, chart, labs and discussed the procedure including the risks, benefits and alternatives for the proposed anesthesia with the patient or authorized representative who has indicated his/her understanding and acceptance.   Dental advisory given  Plan Discussed with: CRNA, Anesthesiologist and Surgeon  Anesthesia Plan Comments:         Anesthesia Quick Evaluation

## 2015-11-03 ENCOUNTER — Encounter (HOSPITAL_BASED_OUTPATIENT_CLINIC_OR_DEPARTMENT_OTHER): Payer: Self-pay | Admitting: General Surgery

## 2015-11-03 NOTE — Addendum Note (Signed)
Addendum  created 11/03/15 [id number] by Ernesta Amble Lajune Perine, CRNA   Modules edited: Charges VN

## 2015-11-04 ENCOUNTER — Telehealth: Payer: Self-pay

## 2015-11-04 ENCOUNTER — Telehealth: Payer: Self-pay | Admitting: General Surgery

## 2015-11-04 NOTE — Telephone Encounter (Signed)
Breast lumpectomy and sentinel lymph node biopsy on Wednesday, Dr Barry Dienes gave her some information. She wants clarification from Dr Lindi Adie please.  She does have an appt on next Wednesday but she has a question now. The MRI and other imaging did not show lymph node involvement. Dr Barry Dienes said there was a little lymph node involvement. She is requesting a phone call from Dr Lindi Adie please.

## 2015-11-04 NOTE — Telephone Encounter (Signed)
Discussed pathology with patient.  Negative margins.  + LN.  Dr. Lindi Adie has sent mammaprint.  Chemo decision to be based on this result and discussion with patient.    She has follow up appt made with me already in 2 weeks.

## 2015-11-07 ENCOUNTER — Telehealth: Payer: Self-pay | Admitting: *Deleted

## 2015-11-07 NOTE — Telephone Encounter (Signed)
  Oncology Nurse Navigator Documentation    Navigator Encounter Type: Telephone (11/07/15 1300) Telephone: Lahoma Crocker Call;Appt Confirmation/Clarification (11/07/15 1300)                                        Time Spent with Patient: 15 (11/07/15 1300)

## 2015-11-09 ENCOUNTER — Ambulatory Visit (HOSPITAL_BASED_OUTPATIENT_CLINIC_OR_DEPARTMENT_OTHER): Payer: 59 | Admitting: Hematology and Oncology

## 2015-11-09 ENCOUNTER — Encounter: Payer: Self-pay | Admitting: Hematology and Oncology

## 2015-11-09 VITALS — BP 144/77 | HR 68 | Temp 97.6°F | Resp 18 | Wt 173.7 lb

## 2015-11-09 DIAGNOSIS — C773 Secondary and unspecified malignant neoplasm of axilla and upper limb lymph nodes: Secondary | ICD-10-CM | POA: Diagnosis not present

## 2015-11-09 DIAGNOSIS — C50412 Malignant neoplasm of upper-outer quadrant of left female breast: Secondary | ICD-10-CM

## 2015-11-09 NOTE — Progress Notes (Signed)
Patient Care Team: Abner Greenspan, MD as PCP - General  SUMMARY OF ONCOLOGIC HISTORY:   Breast cancer of upper-outer quadrant of left female breast ([place])   09/28/2015 Initial Diagnosis Left breast biopsy 2:00 position: IDC with DCIS, grade 1, ER 100%, PR 20%, Ki-67 10%, HER-2 negative ratio 1.32, MRI revealed 7 x 5 x 10 mm in the posterior third left breast, T1 cN0 stage IA clinical stage   11/02/2015 Surgery Left lumpectomy: IDC with DCIS, 1 cm, margins negative, 1/3 lymph nodes positive, ER 100%, PR 20%, Ki-67 10%, HER-2 negative ratio 1.32, T1bN1 stage II a    CHIEF COMPLIANT: Follow-up after recent lumpectomy  INTERVAL HISTORY: 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 She is recovering very well from surgery. She is quite sad to note that she had one positive lymph node. She is extremely anxious and worried about having to receive chemotherapy because of hair loss issues. We presented her case at tumor board earlier this morning.  REVIEW OF SYSTEMS:   Constitutional: Denies fevers, chills or abnormal weight loss Eyes: Denies blurriness of vision Ears, nose, mouth, throat, and face: Denies mucositis or sore throat Respiratory: Denies cough, dyspnea or wheezes Cardiovascular: Denies palpitation, chest discomfort Gastrointestinal:  Denies nausea, heartburn or change in bowel habits Skin: Denies abnormal skin rashes Lymphatics: Denies new lymphadenopathy or easy bruising Neurological:Denies numbness, tingling or new weaknesses Behavioral/Psych: Mood is stable, no new changes  Extremities: No lower extremity edema Breast: Recent lumpectomy surgery All other systems were reviewed with the patient and are negative.  I have reviewed the past medical history, past surgical history, social history and family history with the patient and they are unchanged from previous note.  ALLERGIES:  has No Known  Allergies.  MEDICATIONS:  Current Outpatient Prescriptions  Medication Sig Dispense Refill  . ALPRAZolam (XANAX) 0.5 MG tablet TAKE 1 TABLET BY MOUTH DAILY AS NEEDED FOR ANXIETY WHILE TRAVELLING 20 tablet 0  . Multiple Vitamin (MULTIVITAMIN) tablet Take 1 tablet by mouth daily.      Marland Kitchen oxyCODONE-acetaminophen (ROXICET) 5-325 MG tablet Take 1-2 tablets by mouth every 4 (four) hours as needed for severe pain. 30 tablet 0   No current facility-administered medications for this visit.    PHYSICAL EXAMINATION: ECOG PERFORMANCE STATUS: 1 - Symptomatic but completely ambulatory  Filed Vitals:   11/09/15 1516  BP: 144/77  Pulse: 68  Temp: 97.6 F (36.4 C)  Resp: 18   Filed Weights   11/09/15 1516  Weight: 173 lb 11.2 oz (78.79 kg)    GENERAL:alert, no distress and comfortable SKIN: skin color, texture, turgor are normal, no rashes or significant lesions EYES: normal, Conjunctiva are pink and non-injected, sclera clear OROPHARYNX:no exudate, no erythema and lips, buccal mucosa, and tongue normal  NECK: supple, thyroid normal size, non-tender, without nodularity LYMPH:  no palpable lymphadenopathy in the cervical, axillary or inguinal LUNGS: clear to auscultation and percussion with normal breathing effort HEART: regular rate & rhythm and no murmurs and no lower extremity edema ABDOMEN:abdomen soft, non-tender and normal bowel sounds MUSCULOSKELETAL:no cyanosis of digits and no clubbing  NEURO: alert & oriented x 3 with fluent speech, no focal motor/sensory deficits EXTREMITIES: No lower extremity edema  LABORATORY DATA:  I have reviewed the data as listed   Chemistry      Component Value Date/Time   NA 137 09/03/2014 0819   K 4.3 09/03/2014 7680  CL 104 09/03/2014 0819   CO2 30 09/03/2014 0819   BUN 20 09/03/2014 0819   CREATININE 0.74 09/03/2014 0819   CREATININE 0.72 01/16/2013 1639      Component Value Date/Time   CALCIUM 9.4 09/03/2014 0819   ALKPHOS 83 09/03/2014  0819   AST 21 09/03/2014 0819   ALT 23 09/03/2014 0819   BILITOT 0.4 09/03/2014 0819       Lab Results  Component Value Date   WBC 4.7 09/03/2014   HGB 11.8* 11/02/2015   HCT 36.8 09/03/2014   MCV 83.0 09/03/2014   PLT 309.0 09/03/2014   NEUTROABS 2.0 09/03/2014     ASSESSMENT & PLAN:  Breast cancer of upper-outer quadrant of left female breast ([place]) Left lumpectomy: IDC with DCIS, 1 cm, margins negative, 1/3 lymph nodes positive, ER 100%, PR 20%, Ki-67 10%, HER-2 negative ratio 1.32, T1bN1 stage II a  Pathology counseling: I discussed the final pathology report of the patient provided  a copy of this report. I discussed the margins as well as lymph node surgeries. We also discussed the final staging along with previously performed ER/PR and HER-2/neu testing.  Treatment plan: 1. Mammaprint testing to determine if she would benefit from chemotherapy 2. Followed by adjuvant radiation 3. Followed by adjuvant antiestrogen therapy  Mammaprint counseling: MINDACT is a prospective, randomized phase III controlled trial that investigates the clinical utility of MammaPrint, when compared to standard clinical pathological criteria, with 6,693 patients enrolled from over 111 institutions. Clinical high-risk patients with a Low Risk MammaPrint result, including 48% node-positive, had 5-year distant metastasis-free survival rate in excess of 94 percent, whether randomized to receive adjuvant chemotherapy or not proving MammaPrint's ability to safely identify Low Risk patients.  Return to clinic based upon Mammaprint test results.    No orders of the defined types were placed in this encounter.   The patient has a good understanding of the overall plan. she agrees with it. she will call with any problems that may develop before the next visit here.   Rulon Eisenmenger, MD 11/09/2015

## 2015-11-09 NOTE — Assessment & Plan Note (Signed)
Left lumpectomy: IDC with DCIS, 1 cm, margins negative, 1/3 lymph nodes positive, ER 100%, PR 20%, Ki-67 10%, HER-2 negative ratio 1.32, T1bN1 stage II a  Pathology counseling: I discussed the final pathology report of the patient provided  a copy of this report. I discussed the margins as well as lymph node surgeries. We also discussed the final staging along with previously performed ER/PR and HER-2/neu testing.  Treatment plan: 1. Mammaprint testing to determine if she would benefit from chemotherapy 2. Followed by adjuvant radiation 3. Followed by adjuvant antiestrogen therapy  Mammaprint counseling: MINDACT is a prospective, randomized phase III controlled trial that investigates the clinical utility of MammaPrint, when compared to standard clinical pathological criteria, with 6,693 patients enrolled from over 111 institutions. Clinical high-risk patients with a Low Risk MammaPrint result, including 48% node-positive, had 5-year distant metastasis-free survival rate in excess of 94 percent, whether randomized to receive adjuvant chemotherapy or not proving MammaPrint's ability to safely identify Low Risk patients.  Return to clinic based upon Mammaprint test results.

## 2015-11-15 ENCOUNTER — Encounter (HOSPITAL_COMMUNITY): Payer: Self-pay

## 2015-11-16 ENCOUNTER — Telehealth: Payer: Self-pay | Admitting: *Deleted

## 2015-11-16 NOTE — Telephone Encounter (Signed)
TC from patient seeking clarification on her next steps for her breast cancer treatment. She had lumpectomy on 11/02/15 and had her post op visit with Dr. Barry Dienes yesterday (11/15/15). Her understanding is that she will start radiation  But she doesn't know when. She indicated that it would be more efficient to have RT in [place] as she lives in [place].  She has seen  Dr. Lanell Persons in [place] as of 10/24/15

## 2015-11-17 ENCOUNTER — Telehealth: Payer: Self-pay | Admitting: *Deleted

## 2015-11-17 NOTE — Telephone Encounter (Signed)
Received Mammaprint results - Low Risk.  Placed a copy in MD's box and took one to HIM to scan.

## 2015-11-23 ENCOUNTER — Ambulatory Visit
Admission: RE | Admit: 2015-11-23 | Discharge: 2015-11-23 | Disposition: A | Discharge status: Home / Self Care | Payer: 59 | Source: Ambulatory Visit | Attending: Radiation Oncology | Admitting: Radiation Oncology

## 2015-11-23 ENCOUNTER — Encounter: Payer: Self-pay | Admitting: Radiation Oncology

## 2015-11-23 VITALS — BP 120/77 | HR 72 | Temp 97.3°F | Resp 20 | Wt 172.7 lb

## 2015-11-23 DIAGNOSIS — Z170 Estrogen receptor positive status [ER+]: Secondary | ICD-10-CM | POA: Diagnosis not present

## 2015-11-23 DIAGNOSIS — Z801 Family history of malignant neoplasm of trachea, bronchus and lung: Secondary | ICD-10-CM | POA: Insufficient documentation

## 2015-11-23 DIAGNOSIS — K219 Gastro-esophageal reflux disease without esophagitis: Secondary | ICD-10-CM | POA: Insufficient documentation

## 2015-11-23 DIAGNOSIS — C773 Secondary and unspecified malignant neoplasm of axilla and upper limb lymph nodes: Secondary | ICD-10-CM | POA: Diagnosis not present

## 2015-11-23 DIAGNOSIS — Z8051 Family history of malignant neoplasm of kidney: Secondary | ICD-10-CM | POA: Diagnosis not present

## 2015-11-23 DIAGNOSIS — Z8052 Family history of malignant neoplasm of bladder: Secondary | ICD-10-CM | POA: Diagnosis not present

## 2015-11-23 DIAGNOSIS — F419 Anxiety disorder, unspecified: Secondary | ICD-10-CM | POA: Diagnosis not present

## 2015-11-23 DIAGNOSIS — Z800 Family history of malignant neoplasm of digestive organs: Secondary | ICD-10-CM | POA: Insufficient documentation

## 2015-11-23 DIAGNOSIS — Z8042 Family history of malignant neoplasm of prostate: Secondary | ICD-10-CM | POA: Insufficient documentation

## 2015-11-23 DIAGNOSIS — Z510 Encounter for antineoplastic radiation therapy: Secondary | ICD-10-CM | POA: Diagnosis not present

## 2015-11-23 DIAGNOSIS — Z803 Family history of malignant neoplasm of breast: Secondary | ICD-10-CM | POA: Diagnosis not present

## 2015-11-23 DIAGNOSIS — C50412 Malignant neoplasm of upper-outer quadrant of left female breast: Secondary | ICD-10-CM | POA: Diagnosis present

## 2015-11-23 NOTE — Consult Note (Signed)
Except an outstanding is perfect of Radiation Oncology NEW PATIENT EVALUATION  Name: Jamie Shaffer  MRN: [id number]  Date:   11/23/2015     DOB: [birth year]   This 55 y.o. female patient presents to the clinic for initial evaluation of invasive mammary carcinoma of the left breast stage IIa (T1 cN1 M0) ER/PR positive HER-2/neu negative status post wide local excision and sentinel node biopsy with low recurrence risk based on MammaPrint.  REFERRING PHYSICIAN: [name], MD  CHIEF COMPLAINT:  Chief Complaint  Patient presents with  . Breast Cancer    Pt is here for initial consultation of breast cancer.      DIAGNOSIS: The encounter diagnosis was Malignant neoplasm of upper-outer quadrant of left female breast ([place]).   PREVIOUS INVESTIGATIONS:  Pathology reports reviewed Mammograms and ultrasound reviewed Clinical notes reviewed  HPI: Patient is a 55 year old female who presented with an abnormal mammogram of the left breast showing a 1.2 suspicious mass at the 2:00 position of the left breast some assignments from the nipple. This was confirmed on MRI scan showing mass in the posterior third of the upper-outer quadrant of the left breast. No other breast malignancy was noted. Biopsy was performed and positive for ER/PR positive HER-2/neu negative grade 1 invasive mammary carcinoma with ductal carcinoma in situ. She underwent wide local excision for invasive mammary carcinoma as well as ductal carcinoma in situ 1 cm lesion with margins clear. 4 sentinel lymph nodes were examined one had a focus of metastatic disease. Patient underwent MammaPrint showing low risk of recurrence and was declined for chemotherapy. She's done well postoperatively. She still somewhat tired and fatigued and has some occasional pain in her left breast otherwise is doing well. She has had fluid removed from her lumpectomy site 1 time.  PLANNED TREATMENT REGIMEN: Whole breast and peripheral lymphatic  radiation  PAST MEDICAL HISTORY:  has a past medical history of Fibrocystic breast; Panic disorder; Post-menopausal; GERD (gastroesophageal reflux disease); and Cancer ([place]) (10-2015).    PAST SURGICAL HISTORY:  Past Surgical History  Procedure Laterality Date  . Pilonidal cyst excision    . Fibroid tumor    . Cesarean section    . Vaginal hysterectomy      partial  . Breast lumpectomy with radioactive seed and sentinel lymph node biopsy Left 11/02/2015    Procedure: BREAST LUMPECTOMY WITH RADIOACTIVE SEED AND SENTINEL LYMPH NODE BIOPSY;  Surgeon: [name], MD;  Location: [facility];  Service: General;  Laterality: Left;    FAMILY HISTORY: family history includes Alzheimer's disease in her maternal aunt; Aneurysm in her maternal grandmother; Bladder Cancer in her paternal uncle; Bladder Cancer (age of onset: 59) in her brother; Breast cancer in her cousin and sister; Breast cancer (age of onset: 38) in her cousin; Breast cancer (age of onset: 22) in her sister; Breast cancer (age of onset: 51) in her sister; Breast cancer (age of onset: 55) in her mother; Cancer in her cousin; Colon cancer in her maternal aunt, maternal grandfather, and maternal uncle; Diabetes in her mother; Fibroids in her sister, sister, and sister; Heart Problems in her father and maternal uncle; Heart attack in her paternal grandfather and paternal grandmother; Heart disease in her paternal grandfather and paternal grandmother; Heart failure (age of onset: 54) in her mother; Hypertension in her mother; Kidney cancer in her cousin; Kidney failure (age of onset: 62) in her father; Lung cancer in her brother and cousin; Melanoma in her cousin; Prostate cancer in  her cousin and father.  SOCIAL HISTORY:  reports that she has never smoked. She has never used smokeless tobacco. She reports that she does not drink alcohol or use illicit drugs.  ALLERGIES: Review of patient's allergies indicates no known  allergies.  MEDICATIONS:  Current Outpatient Prescriptions  Medication Sig Dispense Refill  . ALPRAZolam (XANAX) 0.5 MG tablet TAKE 1 TABLET BY MOUTH DAILY AS NEEDED FOR ANXIETY WHILE TRAVELLING 20 tablet 0  . Multiple Vitamin (MULTIVITAMIN) tablet Take 1 tablet by mouth daily.      Marland Kitchen oxyCODONE-acetaminophen (ROXICET) 5-325 MG tablet Take 1-2 tablets by mouth every 4 (four) hours as needed for severe pain. (Patient not taking: Reported on 11/23/2015) 30 tablet 0   No current facility-administered medications for this encounter.    ECOG PERFORMANCE STATUS:  0 - Asymptomatic  REVIEW OF SYSTEMS:  Patient denies any weight loss, fatigue, weakness, fever, chills or night sweats. Patient denies any loss of vision, blurred vision. Patient denies any ringing  of the ears or hearing loss. No irregular heartbeat. Patient denies heart murmur or history of fainting. Patient denies any chest pain or pain radiating to her upper extremities. Patient denies any shortness of breath, difficulty breathing at night, cough or hemoptysis. Patient denies any swelling in the lower legs. Patient denies any nausea vomiting, vomiting of blood, or coffee ground material in the vomitus. Patient denies any stomach pain. Patient states has had normal bowel movements no significant constipation or diarrhea. Patient denies any dysuria, hematuria or significant nocturia. Patient denies any problems walking, swelling in the joints or loss of balance. Patient denies any skin changes, loss of hair or loss of weight. Patient denies any excessive worrying or anxiety or significant depression. Patient denies any problems with insomnia. Patient denies excessive thirst, polyuria, polydipsia. Patient denies any swollen glands, patient denies easy bruising or easy bleeding. Patient denies any recent infections, allergies or URI. Patient "s visual fields have not changed significantly in recent time.    PHYSICAL EXAM: BP 120/77 mmHg  Pulse 72   Temp(Src) 97.3 F (36.3 C)  Resp 20  Wt 172 lb 11.7 oz (78.35 kg) Well-developed female in NAD. She status post wide local excision of the left breast. No dominant mass or nodularity is noted in either breast in 2 positions examined. No axillary or supraclavicular adenopathy is identified. Well-developed well-nourished patient in NAD. HEENT reveals PERLA, EOMI, discs not visualized.  Oral cavity is clear. No oral mucosal lesions are identified. Neck is clear without evidence of cervical or supraclavicular adenopathy. Lungs are clear to A&P. Cardiac examination is essentially unremarkable with regular rate and rhythm without murmur rub or thrill. Abdomen is benign with no organomegaly or masses noted. Motor sensory and DTR levels are equal and symmetric in the upper and lower extremities. Cranial nerves II through XII are grossly intact. Proprioception is intact. No peripheral adenopathy or edema is identified. No motor or sensory levels are noted. Crude visual fields are within normal range.  LABORATORY DATA: Pathology reports reviewed    RADIOLOGY RESULTS: Mammograms ultrasound and MRI scan reviewed and compatible with the above-stated findings   IMPRESSION: Stage IIa invasive mammary carcinoma the left breast status post wide local excision and sentinel node biopsy with one positive axillary node ER/PR positive HER-2/neu not overexpressed in 55 year old female  PLAN: At this time I to go ahead with whole breast and peripheral lymphatic radiation. I would treat her peripheral lymphatics since she's only had 4 lymph nodes removed in one  of them had positive pathology and treat to her axilla as well as the whole breast to 5000 cGy over 5 weeks. Would also boost her scar another 1400 cGy using electron beam. Risks and benefits of treatment including skin reaction, fatigue, alteration of blood counts, inclusion of superficial lung all were described in detail to the patient. We'll do our best to keep  any radiation from her heart. I first the set up and ordered CT simulation for early next week. Patient also would be a candidate for aromatase inhibitor therapy after completion of radiation.  I would like to take this opportunity to thank you for allowing me to participate in the care of your patient.Armstead Peaks., MD

## 2015-11-24 ENCOUNTER — Telehealth: Payer: 59 | Admitting: Physician Assistant

## 2015-11-24 DIAGNOSIS — B9689 Other specified bacterial agents as the cause of diseases classified elsewhere: Secondary | ICD-10-CM

## 2015-11-24 DIAGNOSIS — J019 Acute sinusitis, unspecified: Secondary | ICD-10-CM

## 2015-11-24 MED ORDER — AMOXICILLIN-POT CLAVULANATE 875-125 MG PO TABS: 1.0000 | ORAL_TABLET | Freq: Two times a day (BID) | ORAL | Status: DC

## 2015-11-24 NOTE — Progress Notes (Signed)

## 2015-11-29 ENCOUNTER — Other Ambulatory Visit: Payer: Self-pay | Admitting: *Deleted

## 2015-11-29 ENCOUNTER — Ambulatory Visit
Admission: RE | Admit: 2015-11-29 | Discharge: 2015-11-29 | Disposition: A | Discharge status: Home / Self Care | Payer: 59 | Source: Ambulatory Visit | Attending: Radiation Oncology | Admitting: Radiation Oncology

## 2015-11-29 DIAGNOSIS — C50412 Malignant neoplasm of upper-outer quadrant of left female breast: Secondary | ICD-10-CM | POA: Diagnosis not present

## 2015-11-30 ENCOUNTER — Telehealth: Payer: Self-pay | Admitting: Hematology and Oncology

## 2015-11-30 NOTE — Telephone Encounter (Signed)
lvm for pt regarding to Aug appt... °

## 2015-12-01 DIAGNOSIS — C50412 Malignant neoplasm of upper-outer quadrant of left female breast: Secondary | ICD-10-CM | POA: Diagnosis not present

## 2015-12-02 ENCOUNTER — Other Ambulatory Visit: Payer: Self-pay | Admitting: *Deleted

## 2015-12-02 DIAGNOSIS — C50412 Malignant neoplasm of upper-outer quadrant of left female breast: Secondary | ICD-10-CM

## 2015-12-07 ENCOUNTER — Ambulatory Visit
Admission: RE | Admit: 2015-12-07 | Discharge: 2015-12-07 | Disposition: A | Discharge status: Home / Self Care | Payer: 59 | Source: Ambulatory Visit | Attending: Radiation Oncology | Admitting: Radiation Oncology

## 2015-12-07 DIAGNOSIS — C50412 Malignant neoplasm of upper-outer quadrant of left female breast: Secondary | ICD-10-CM | POA: Diagnosis not present

## 2015-12-12 ENCOUNTER — Ambulatory Visit
Admission: RE | Admit: 2015-12-12 | Discharge: 2015-12-12 | Disposition: A | Discharge status: Home / Self Care | Payer: 59 | Source: Ambulatory Visit | Attending: Radiation Oncology | Admitting: Radiation Oncology

## 2015-12-12 DIAGNOSIS — C50412 Malignant neoplasm of upper-outer quadrant of left female breast: Secondary | ICD-10-CM | POA: Diagnosis not present

## 2015-12-13 ENCOUNTER — Ambulatory Visit: Payer: 59

## 2015-12-13 ENCOUNTER — Ambulatory Visit
Admission: RE | Admit: 2015-12-13 | Discharge: 2015-12-13 | Disposition: A | Discharge status: Home / Self Care | Payer: 59 | Source: Ambulatory Visit | Attending: Radiation Oncology | Admitting: Radiation Oncology

## 2015-12-13 DIAGNOSIS — C50412 Malignant neoplasm of upper-outer quadrant of left female breast: Secondary | ICD-10-CM | POA: Diagnosis not present

## 2015-12-14 ENCOUNTER — Ambulatory Visit: Payer: 59

## 2015-12-14 ENCOUNTER — Ambulatory Visit
Admission: RE | Admit: 2015-12-14 | Discharge: 2015-12-14 | Disposition: A | Discharge status: Home / Self Care | Payer: 59 | Source: Ambulatory Visit | Attending: Radiation Oncology | Admitting: Radiation Oncology

## 2015-12-14 DIAGNOSIS — C50412 Malignant neoplasm of upper-outer quadrant of left female breast: Secondary | ICD-10-CM | POA: Diagnosis not present

## 2015-12-15 ENCOUNTER — Ambulatory Visit
Admission: RE | Admit: 2015-12-15 | Discharge: 2015-12-15 | Disposition: A | Discharge status: Home / Self Care | Payer: 59 | Source: Ambulatory Visit | Attending: Radiation Oncology | Admitting: Radiation Oncology

## 2015-12-15 ENCOUNTER — Ambulatory Visit: Payer: 59

## 2015-12-15 ENCOUNTER — Telehealth: Payer: Self-pay | Admitting: *Deleted

## 2015-12-15 DIAGNOSIS — C50412 Malignant neoplasm of upper-outer quadrant of left female breast: Secondary | ICD-10-CM | POA: Diagnosis not present

## 2015-12-15 NOTE — Telephone Encounter (Signed)
Left message to follow up after start of radiation.   

## 2015-12-16 ENCOUNTER — Ambulatory Visit
Admission: RE | Admit: 2015-12-16 | Discharge: 2015-12-16 | Disposition: A | Discharge status: Home / Self Care | Payer: 59 | Source: Ambulatory Visit | Attending: Radiation Oncology | Admitting: Radiation Oncology

## 2015-12-16 ENCOUNTER — Ambulatory Visit: Payer: 59

## 2015-12-16 DIAGNOSIS — C50412 Malignant neoplasm of upper-outer quadrant of left female breast: Secondary | ICD-10-CM | POA: Diagnosis not present

## 2015-12-19 ENCOUNTER — Ambulatory Visit: Payer: 59

## 2015-12-19 ENCOUNTER — Ambulatory Visit
Admission: RE | Admit: 2015-12-19 | Discharge: 2015-12-19 | Disposition: A | Discharge status: Home / Self Care | Payer: 59 | Source: Ambulatory Visit | Attending: Radiation Oncology | Admitting: Radiation Oncology

## 2015-12-19 DIAGNOSIS — C50412 Malignant neoplasm of upper-outer quadrant of left female breast: Secondary | ICD-10-CM | POA: Diagnosis not present

## 2015-12-21 ENCOUNTER — Ambulatory Visit
Admission: RE | Admit: 2015-12-21 | Discharge: 2015-12-21 | Disposition: A | Discharge status: Home / Self Care | Payer: 59 | Source: Ambulatory Visit | Attending: Radiation Oncology | Admitting: Radiation Oncology

## 2015-12-21 ENCOUNTER — Ambulatory Visit: Payer: 59

## 2015-12-21 DIAGNOSIS — C50412 Malignant neoplasm of upper-outer quadrant of left female breast: Secondary | ICD-10-CM | POA: Diagnosis not present

## 2015-12-22 ENCOUNTER — Ambulatory Visit
Admission: RE | Admit: 2015-12-22 | Discharge: 2015-12-22 | Disposition: A | Discharge status: Home / Self Care | Payer: 59 | Source: Ambulatory Visit | Attending: Radiation Oncology | Admitting: Radiation Oncology

## 2015-12-22 ENCOUNTER — Ambulatory Visit: Payer: 59

## 2015-12-22 ENCOUNTER — Inpatient Hospital Stay: Payer: 59 | Attending: Radiation Oncology

## 2015-12-22 DIAGNOSIS — C50412 Malignant neoplasm of upper-outer quadrant of left female breast: Secondary | ICD-10-CM | POA: Diagnosis present

## 2015-12-22 DIAGNOSIS — Z170 Estrogen receptor positive status [ER+]: Secondary | ICD-10-CM | POA: Insufficient documentation

## 2015-12-22 LAB — CBC
HCT: 36.3 % (ref 35.0–47.0)
Hemoglobin: 12.1 g/dL (ref 12.0–16.0)
MCH: 27.4 pg (ref 26.0–34.0)
MCHC: 33.2 g/dL (ref 32.0–36.0)
MCV: 82.6 fL (ref 80.0–100.0)
Platelets: 286 10*3/uL (ref 150–440)
RBC: 4.39 MIL/uL (ref 3.80–5.20)
RDW: 14.2 % (ref 11.5–14.5)
WBC: 3.5 10*3/uL — ABNORMAL LOW (ref 3.6–11.0)

## 2015-12-23 ENCOUNTER — Ambulatory Visit: Payer: 59

## 2015-12-23 ENCOUNTER — Ambulatory Visit
Admission: RE | Admit: 2015-12-23 | Discharge: 2015-12-23 | Disposition: A | Discharge status: Home / Self Care | Payer: 59 | Source: Ambulatory Visit | Attending: Radiation Oncology | Admitting: Radiation Oncology

## 2015-12-23 DIAGNOSIS — C50412 Malignant neoplasm of upper-outer quadrant of left female breast: Secondary | ICD-10-CM | POA: Diagnosis not present

## 2015-12-26 ENCOUNTER — Ambulatory Visit
Admission: RE | Admit: 2015-12-26 | Discharge: 2015-12-26 | Disposition: A | Discharge status: Home / Self Care | Payer: 59 | Source: Ambulatory Visit | Attending: Radiation Oncology | Admitting: Radiation Oncology

## 2015-12-26 ENCOUNTER — Ambulatory Visit: Payer: 59

## 2015-12-26 DIAGNOSIS — C50412 Malignant neoplasm of upper-outer quadrant of left female breast: Secondary | ICD-10-CM | POA: Diagnosis not present

## 2015-12-27 ENCOUNTER — Ambulatory Visit: Payer: 59

## 2015-12-27 ENCOUNTER — Ambulatory Visit
Admission: RE | Admit: 2015-12-27 | Discharge: 2015-12-27 | Disposition: A | Discharge status: Home / Self Care | Payer: 59 | Source: Ambulatory Visit | Attending: Radiation Oncology | Admitting: Radiation Oncology

## 2015-12-27 DIAGNOSIS — C50412 Malignant neoplasm of upper-outer quadrant of left female breast: Secondary | ICD-10-CM | POA: Diagnosis not present

## 2015-12-28 ENCOUNTER — Ambulatory Visit
Admission: RE | Admit: 2015-12-28 | Discharge: 2015-12-28 | Disposition: A | Discharge status: Home / Self Care | Payer: 59 | Source: Ambulatory Visit | Attending: Radiation Oncology | Admitting: Radiation Oncology

## 2015-12-28 ENCOUNTER — Ambulatory Visit: Payer: 59

## 2015-12-28 DIAGNOSIS — C50412 Malignant neoplasm of upper-outer quadrant of left female breast: Secondary | ICD-10-CM | POA: Diagnosis not present

## 2015-12-29 ENCOUNTER — Ambulatory Visit: Payer: 59

## 2015-12-29 ENCOUNTER — Telehealth: Payer: Self-pay | Admitting: Family Medicine

## 2015-12-29 ENCOUNTER — Ambulatory Visit
Admission: RE | Admit: 2015-12-29 | Discharge: 2015-12-29 | Disposition: A | Discharge status: Home / Self Care | Payer: 59 | Source: Ambulatory Visit | Attending: Radiation Oncology | Admitting: Radiation Oncology

## 2015-12-29 DIAGNOSIS — Z0000 Encounter for general adult medical examination without abnormal findings: Secondary | ICD-10-CM

## 2015-12-29 DIAGNOSIS — C50412 Malignant neoplasm of upper-outer quadrant of left female breast: Secondary | ICD-10-CM | POA: Diagnosis not present

## 2015-12-29 NOTE — Telephone Encounter (Signed)
-----   Message from Marchia Bond sent at 12/27/2015  3:24 PM EDT ----- Regarding: Cpx labs Wed 7/19, need orders. Thanks! :-) Please order  future cpx labs for pt's upcoming lab appt. Thanks Aniceto Boss

## 2015-12-30 ENCOUNTER — Ambulatory Visit: Payer: 59

## 2015-12-30 ENCOUNTER — Ambulatory Visit
Admission: RE | Admit: 2015-12-30 | Discharge: 2015-12-30 | Disposition: A | Discharge status: Home / Self Care | Payer: 59 | Source: Ambulatory Visit | Attending: Radiation Oncology | Admitting: Radiation Oncology

## 2015-12-30 DIAGNOSIS — C50412 Malignant neoplasm of upper-outer quadrant of left female breast: Secondary | ICD-10-CM | POA: Diagnosis not present

## 2016-01-02 ENCOUNTER — Ambulatory Visit
Admission: RE | Admit: 2016-01-02 | Discharge: 2016-01-02 | Disposition: A | Discharge status: Home / Self Care | Payer: 59 | Source: Ambulatory Visit | Attending: Radiation Oncology | Admitting: Radiation Oncology

## 2016-01-02 ENCOUNTER — Ambulatory Visit: Payer: 59

## 2016-01-02 DIAGNOSIS — C50412 Malignant neoplasm of upper-outer quadrant of left female breast: Secondary | ICD-10-CM | POA: Diagnosis not present

## 2016-01-03 ENCOUNTER — Ambulatory Visit
Admission: RE | Admit: 2016-01-03 | Discharge: 2016-01-03 | Disposition: A | Discharge status: Home / Self Care | Payer: 59 | Source: Ambulatory Visit | Attending: Radiation Oncology | Admitting: Radiation Oncology

## 2016-01-03 ENCOUNTER — Ambulatory Visit: Payer: 59

## 2016-01-03 DIAGNOSIS — C50412 Malignant neoplasm of upper-outer quadrant of left female breast: Secondary | ICD-10-CM | POA: Diagnosis not present

## 2016-01-04 ENCOUNTER — Other Ambulatory Visit (INDEPENDENT_AMBULATORY_CARE_PROVIDER_SITE_OTHER): Payer: 59

## 2016-01-04 ENCOUNTER — Ambulatory Visit: Payer: 59

## 2016-01-04 ENCOUNTER — Ambulatory Visit
Admission: RE | Admit: 2016-01-04 | Discharge: 2016-01-04 | Disposition: A | Discharge status: Home / Self Care | Payer: 59 | Source: Ambulatory Visit | Attending: Radiation Oncology | Admitting: Radiation Oncology

## 2016-01-04 DIAGNOSIS — C50412 Malignant neoplasm of upper-outer quadrant of left female breast: Secondary | ICD-10-CM | POA: Diagnosis not present

## 2016-01-04 DIAGNOSIS — Z0000 Encounter for general adult medical examination without abnormal findings: Secondary | ICD-10-CM

## 2016-01-04 LAB — COMPREHENSIVE METABOLIC PANEL
ALT: 41 U/L — ABNORMAL HIGH (ref 0–35)
AST: 40 U/L — ABNORMAL HIGH (ref 0–37)
Albumin: 3.6 g/dL (ref 3.5–5.2)
Alkaline Phosphatase: 112 U/L (ref 39–117)
BILIRUBIN TOTAL: 0.5 mg/dL (ref 0.2–1.2)
BUN: 13 mg/dL (ref 6–23)
CALCIUM: 9.2 mg/dL (ref 8.4–10.5)
CHLORIDE: 106 meq/L (ref 96–112)
CO2: 30 meq/L (ref 19–32)
Creatinine, Ser: 0.68 mg/dL (ref 0.40–1.20)
GFR: 114.8 mL/min (ref >60.00)
GLUCOSE: 91 mg/dL (ref 70–99)
POTASSIUM: 4.1 meq/L (ref 3.5–5.1)
Sodium: 141 mEq/L (ref 135–145)
Total Protein: 6.9 g/dL (ref 6.0–8.3)

## 2016-01-04 LAB — LIPID PANEL
CHOL/HDL RATIO: 3
Cholesterol: 182 mg/dL (ref 0–200)
HDL: 72.2 mg/dL (ref >39.00)
LDL Cholesterol: 100 mg/dL — ABNORMAL HIGH (ref 0–99)
NONHDL: 110.08
TRIGLYCERIDES: 51 mg/dL (ref 0.0–149.0)
VLDL: 10.2 mg/dL (ref 0.0–40.0)

## 2016-01-04 LAB — CBC WITH DIFFERENTIAL/PLATELET
BASOS ABS: 0 10*3/uL (ref 0.0–0.1)
BASOS PCT: 0.7 % (ref 0.0–3.0)
Eosinophils Absolute: 0.1 10*3/uL (ref 0.0–0.7)
Eosinophils Relative: 2 % (ref 0.0–5.0)
HEMATOCRIT: 34.4 % — AB (ref 36.0–46.0)
Hemoglobin: 11.2 g/dL — ABNORMAL LOW (ref 12.0–15.0)
LYMPHS ABS: 1 10*3/uL (ref 0.7–4.0)
LYMPHS PCT: 29 % (ref 12.0–46.0)
MCHC: 32.5 g/dL (ref 30.0–36.0)
MCV: 83.9 fl (ref 78.0–100.0)
MONOS PCT: 13.4 % — AB (ref 3.0–12.0)
Monocytes Absolute: 0.5 10*3/uL (ref 0.1–1.0)
NEUTROS ABS: 1.9 10*3/uL (ref 1.4–7.7)
NEUTROS PCT: 54.9 % (ref 43.0–77.0)
PLATELETS: 251 10*3/uL (ref 150.0–400.0)
RBC: 4.1 Mil/uL (ref 3.87–5.11)
RDW: 15.1 % (ref 11.5–15.5)
WBC: 3.4 10*3/uL — ABNORMAL LOW (ref 4.0–10.5)

## 2016-01-04 LAB — TSH: TSH: 0.85 u[IU]/mL (ref 0.35–4.50)

## 2016-01-05 ENCOUNTER — Inpatient Hospital Stay: Payer: 59

## 2016-01-05 ENCOUNTER — Ambulatory Visit
Admission: RE | Admit: 2016-01-05 | Discharge: 2016-01-05 | Disposition: A | Discharge status: Home / Self Care | Payer: 59 | Source: Ambulatory Visit | Attending: Radiation Oncology | Admitting: Radiation Oncology

## 2016-01-05 ENCOUNTER — Ambulatory Visit: Payer: 59

## 2016-01-05 DIAGNOSIS — C50412 Malignant neoplasm of upper-outer quadrant of left female breast: Secondary | ICD-10-CM | POA: Diagnosis not present

## 2016-01-06 ENCOUNTER — Ambulatory Visit
Admission: RE | Admit: 2016-01-06 | Discharge: 2016-01-06 | Disposition: A | Discharge status: Home / Self Care | Payer: 59 | Source: Ambulatory Visit | Attending: Radiation Oncology | Admitting: Radiation Oncology

## 2016-01-06 ENCOUNTER — Ambulatory Visit: Payer: 59

## 2016-01-06 DIAGNOSIS — C50412 Malignant neoplasm of upper-outer quadrant of left female breast: Secondary | ICD-10-CM | POA: Diagnosis not present

## 2016-01-09 ENCOUNTER — Ambulatory Visit
Admission: RE | Admit: 2016-01-09 | Discharge: 2016-01-09 | Disposition: A | Discharge status: Home / Self Care | Payer: 59 | Source: Ambulatory Visit | Attending: Radiation Oncology | Admitting: Radiation Oncology

## 2016-01-09 ENCOUNTER — Ambulatory Visit: Payer: 59

## 2016-01-09 DIAGNOSIS — C50412 Malignant neoplasm of upper-outer quadrant of left female breast: Secondary | ICD-10-CM | POA: Diagnosis not present

## 2016-01-10 ENCOUNTER — Ambulatory Visit
Admission: RE | Admit: 2016-01-10 | Discharge: 2016-01-10 | Disposition: A | Discharge status: Home / Self Care | Payer: 59 | Source: Ambulatory Visit | Attending: Radiation Oncology | Admitting: Radiation Oncology

## 2016-01-10 ENCOUNTER — Ambulatory Visit: Payer: 59

## 2016-01-10 DIAGNOSIS — C50412 Malignant neoplasm of upper-outer quadrant of left female breast: Secondary | ICD-10-CM | POA: Diagnosis not present

## 2016-01-11 ENCOUNTER — Ambulatory Visit
Admission: RE | Admit: 2016-01-11 | Discharge: 2016-01-11 | Disposition: A | Discharge status: Home / Self Care | Payer: 59 | Source: Ambulatory Visit | Attending: Radiation Oncology | Admitting: Radiation Oncology

## 2016-01-11 ENCOUNTER — Ambulatory Visit: Payer: 59

## 2016-01-11 ENCOUNTER — Ambulatory Visit (INDEPENDENT_AMBULATORY_CARE_PROVIDER_SITE_OTHER): Payer: 59 | Admitting: Family Medicine

## 2016-01-11 ENCOUNTER — Encounter: Payer: Self-pay | Admitting: Family Medicine

## 2016-01-11 VITALS — BP 124/76 | HR 65 | Temp 98.3°F | Ht 63.0 in | Wt 170.5 lb

## 2016-01-11 DIAGNOSIS — E669 Obesity, unspecified: Secondary | ICD-10-CM

## 2016-01-11 DIAGNOSIS — Z800 Family history of malignant neoplasm of digestive organs: Secondary | ICD-10-CM | POA: Diagnosis not present

## 2016-01-11 DIAGNOSIS — C50412 Malignant neoplasm of upper-outer quadrant of left female breast: Secondary | ICD-10-CM

## 2016-01-11 DIAGNOSIS — Z0000 Encounter for general adult medical examination without abnormal findings: Secondary | ICD-10-CM | POA: Diagnosis not present

## 2016-01-11 MED ORDER — ALPRAZOLAM 0.5 MG PO TABS: ORAL_TABLET | ORAL | 0 refills | Status: DC

## 2016-01-11 NOTE — Progress Notes (Signed)
Pre visit review using our clinic review tool, if applicable. No additional management support is needed unless otherwise documented below in the visit note. 

## 2016-01-11 NOTE — Patient Instructions (Addendum)
Try to get 1200-1500 mg of calcium per day with at least 1000 iu of vitamin D - for bone health  Exercise when you feel up to it  Get a flu shot every fall  Don't forget to schedule your colonoscopy at the end of the year  Take care of yourself  Try to work on a healthier diet

## 2016-01-11 NOTE — Progress Notes (Signed)
Subjective:    Patient ID: Jamie Shaffer, female    DOB: Oct 20, 1960, 55 y.o.   MRN: [id number]  HPI Here for health maintenance exam and to review chronic medical problems    Wt Readings from Last 3 Encounters:  01/11/16 170 lb 8 oz (77.3 kg)  11/23/15 172 lb 11.7 oz (78.4 kg)  11/09/15 173 lb 11.2 oz (78.8 kg)  eating fair/not really healthy - plans to after done with breast cancer tx and also exercise  bmi is 30.2-obese range   Hep C/HIV screen- does not think she is high risk (declines screening for now)  Flu shot last fall=gets one every year   Mammogram 4/17 Being treated for L breast cancer -lumpectomy and radioactive seed Has had 22 treatments so far - getting close to being done  Self breast exam Breast cancer sisters and mother  Her genetic testing- all ok so far (her breast cancer was est rec pos) Will be starting on anti estrogen therapy   Pt states Dr Corinna Capra did a dexa last year-t was ok - ? Low side of normal  On ca and D  No falls or fractures    Pap 8/16-nl /gyn Sees Dr Corinna Capra  She will continue to follow up with him   Colonoscopy 6/12-normal with 5 y recall  Colon cancer aunts and uncles  Due for colonoscopy- she got her reminder - plans to schedule that at the end of the year when she is done with radiation    Tdap 2012  Results for orders placed or performed in visit on 01/04/16  CBC with Differential/Platelet  Result Value Ref Range   WBC 3.4 (L) 4.0 - 10.5 K/uL   RBC 4.10 3.87 - 5.11 Mil/uL   Hemoglobin 11.2 (L) 12.0 - 15.0 g/dL   HCT 34.4 (L) 36.0 - 46.0 %   MCV 83.9 78.0 - 100.0 fl   MCHC 32.5 30.0 - 36.0 g/dL   RDW 15.1 11.5 - 15.5 %   Platelets 251.0 150.0 - 400.0 K/uL   Neutrophils Relative % 54.9 43.0 - 77.0 %   Lymphocytes Relative 29.0 12.0 - 46.0 %   Monocytes Relative 13.4 (H) 3.0 - 12.0 %   Eosinophils Relative 2.0 0.0 - 5.0 %   Basophils Relative 0.7 0.0 - 3.0 %   Neutro Abs 1.9 1.4 - 7.7 K/uL   Lymphs Abs 1.0 0.7 - 4.0  K/uL   Monocytes Absolute 0.5 0.1 - 1.0 K/uL   Eosinophils Absolute 0.1 0.0 - 0.7 K/uL   Basophils Absolute 0.0 0.0 - 0.1 K/uL  Comprehensive metabolic panel  Result Value Ref Range   Sodium 141 135 - 145 mEq/L   Potassium 4.1 3.5 - 5.1 mEq/L   Chloride 106 96 - 112 mEq/L   CO2 30 19 - 32 mEq/L   Glucose, Bld 91 70 - 99 mg/dL   BUN 13 6 - 23 mg/dL   Creatinine, Ser 0.68 0.40 - 1.20 mg/dL   Total Bilirubin 0.5 0.2 - 1.2 mg/dL   Alkaline Phosphatase 112 39 - 117 U/L   AST 40 (H) 0 - 37 U/L   ALT 41 (H) 0 - 35 U/L   Total Protein 6.9 6.0 - 8.3 g/dL   Albumin 3.6 3.5 - 5.2 g/dL   Calcium 9.2 8.4 - 10.5 mg/dL   GFR 114.80 >60.00 mL/min  Lipid panel  Result Value Ref Range   Cholesterol 182 0 - 200 mg/dL   Triglycerides 51.0 0.0 - 149.0 mg/dL  HDL 72.20 >39.00 mg/dL   VLDL 10.2 0.0 - 40.0 mg/dL   LDL Cholesterol 100 (H) 0 - 99 mg/dL   Total CHOL/HDL Ratio 3    NonHDL 110.08   TSH  Result Value Ref Range   TSH 0.85 0.35 - 4.50 uIU/mL    No blood in stool and no vaginal bleeding  Ast/alt slt elevated - she was taking tylenol after surgery quite a bit (with oxycocodone)- is done with that now  No alcohol  Diet has not been as good   Takes xanax as needed for travel - needs refill  Patient Active Problem List   Diagnosis Date Noted  . Breast cancer of upper-outer quadrant of left female breast ([place]) 10/24/2015  . Genetic testing 10/24/2015  . Family history of breast cancer in first degree relative 10/04/2015  . Family history of colon cancer 10/04/2015  . Chest pain 09/20/2014  . Acid reflux 09/20/2014  . Obesity 09/20/2014  . Menopausal symptoms 01/16/2013  . Pilonidal cyst 07/09/2012  . Routine general medical examination at a health care facility 02/04/2011  . FIBROCYSTIC BREAST DISEASE 10/20/2007  . PANIC DISORDER 10/17/2007   Past Medical History:  Diagnosis Date  . Cancer ([place]) 10-2015   left breast  . Fibrocystic breast   . GERD (gastroesophageal reflux  disease)   . Panic disorder   . Post-menopausal    Past Surgical History:  Procedure Laterality Date  . BREAST LUMPECTOMY WITH RADIOACTIVE SEED AND SENTINEL LYMPH NODE BIOPSY Left 11/02/2015   Procedure: BREAST LUMPECTOMY WITH RADIOACTIVE SEED AND SENTINEL LYMPH NODE BIOPSY;  Surgeon: [name], MD;  Location: [facility];  Service: General;  Laterality: Left;  . CESAREAN SECTION    . fibroid tumor    . PILONIDAL CYST EXCISION    . VAGINAL HYSTERECTOMY     partial   Social History  Substance Use Topics  . Smoking status: Never Smoker  . Smokeless tobacco: Never Used     Comment: tried smoking as a teenager, but no continued use  . Alcohol use No     Comment: maybe one drink per year   Family History  Problem Relation Age of Onset  . Hypertension Mother   . Diabetes Mother   . Heart failure Mother 5  . Breast cancer Mother 66    +lump  . Breast cancer Sister     dx. 52s; s/p mastectomy  . Breast cancer Sister 22    s/p lump; negative genetic testing  . Fibroids Sister   . Heart Problems Father     valve replacement  . Kidney failure Father 66  . Prostate cancer Father     dx. later age; did not cause his death  . Breast cancer Sister 15    "cancerous cells"; s/p radiation  . Fibroids Sister   . Fibroids Sister   . Bladder Cancer Brother 52    smoker; had issues for 7-8 yrs before going to the doctor  . Colon cancer Maternal Aunt     dx. 60s  . Heart Problems Maternal Uncle   . Lung cancer Brother     s/p surgery to remove nodule?; not a smoker; dx. 61-62  . Alzheimer's disease Maternal Aunt     d. 47  . Colon cancer Maternal Uncle     dx. in 80s or younger  . Lung cancer Cousin     maternal 1st cousin dx. 30s; smoker  . Prostate cancer Cousin  maternal 1st cousin dx. early 98s  . Melanoma Cousin     maternal 1st cousin dx. 12s  . Breast cancer Cousin 55    maternal 1st cousin   . Kidney cancer Cousin     maternal 1st cousin dx. 25s  .  Bladder Cancer Paternal Uncle     dx. late 36s-80s  . Aneurysm Maternal Grandmother     brain; d. 39s  . Colon cancer Maternal Grandfather     d. 40s-50s  . Heart disease Paternal Grandmother     d. 45s  . Heart attack Paternal Grandmother   . Heart disease Paternal Grandfather     d. 43s  . Heart attack Paternal Grandfather   . Breast cancer Cousin     paternal 1st cousin dx. 58s  . Cancer Cousin     (x2) paternal 1st cousins d. unspecified cancers at unspecified ages   No Known Allergies Current Outpatient Prescriptions on File Prior to Visit  Medication Sig Dispense Refill  . Multiple Vitamin (MULTIVITAMIN) tablet Take 1 tablet by mouth daily.       No current facility-administered medications on file prior to visit.     Review of Systems    Review of Systems  Constitutional: Negative for fever, appetite change, and unexpected weight change.  Eyes: Negative for pain and visual disturbance.  Respiratory: Negative for cough and shortness of breath.   Cardiovascular: Negative for cp or palpitations    Gastrointestinal: Negative for nausea, diarrhea and constipation.  Genitourinary: Negative for urgency and frequency.  Skin: Negative for pallor or rash   Neurological: Negative for weakness, light-headedness, numbness and headaches.  Hematological: Negative for adenopathy. Does not bruise/bleed easily.  Psychiatric/Behavioral: Negative for dysphoric mood. The patient is not nervous/anxious.      Objective:   Physical Exam  Constitutional: She appears well-developed and well-nourished. No distress.  Well appearing   HENT:  Head: Normocephalic and atraumatic.  Right Ear: External ear normal.  Left Ear: External ear normal.  Nose: Nose normal.  Mouth/Throat: Oropharynx is clear and moist.  Eyes: Conjunctivae and EOM are normal. Pupils are equal, round, and reactive to light. Right eye exhibits no discharge. Left eye exhibits no discharge. No scleral icterus.  Neck: Normal  range of motion. Neck supple. No JVD present. Carotid bruit is not present. No thyromegaly present.  Cardiovascular: Normal rate, regular rhythm, normal heart sounds and intact distal pulses.  Exam reveals no gallop.   Pulmonary/Chest: Effort normal and breath sounds normal. No respiratory distress. She has no wheezes. She has no rales.  Abdominal: Soft. Bowel sounds are normal. She exhibits no distension and no mass. There is no tenderness.  Musculoskeletal: She exhibits no edema or tenderness.  Lymphadenopathy:    She has no cervical adenopathy.  Neurological: She is alert. She has normal reflexes. No cranial nerve deficit. She exhibits normal muscle tone. Coordination normal.  Skin: Skin is warm and dry. No rash noted. No erythema. No pallor.  Psychiatric: She has a normal mood and affect.          Assessment & Plan:   Problem List Items Addressed This Visit      Other   Routine general medical examination at a health care facility - Primary    Reviewed health habits including diet and exercise and skin cancer prevention Reviewed appropriate screening tests for age  Also reviewed health mt list, fam hx and immunization status , as well as social and family history  See HPI Labs reviewed Try to get 1200-1500 mg of calcium per day with at least 1000 iu of vitamin D - for bone health  Exercise when you feel up to it  Get a flu shot every fall  Don't forget to schedule your colonoscopy at the end of the year  Take care of yourself  Try to work on a healthier diet       Obesity    Discussed how this problem influences overall health and the risks it imposes  Reviewed plan for weight loss with lower calorie diet (via better food choices and also portion control or program like weight watchers) and exercise building up to or more than 30 minutes 5 days per week including some aerobic activity         Family history of colon cancer    Pt is due for colonoscopy Will schedule at  end of year (when done with breast ca tx) GI is aware       Breast cancer of upper-outer quadrant of left female breast ([place])    Doing well after lumpectomy and seed implant  Undergoing radiation tx now  Tolerating well  Will likely follow up with anti est tx  Has baseline dexa done       Relevant Medications   ALPRAZolam (XANAX) 0.5 MG tablet    Other Visit Diagnoses   None.

## 2016-01-12 ENCOUNTER — Ambulatory Visit
Admission: RE | Admit: 2016-01-12 | Discharge: 2016-01-12 | Disposition: A | Discharge status: Home / Self Care | Payer: 59 | Source: Ambulatory Visit | Attending: Radiation Oncology | Admitting: Radiation Oncology

## 2016-01-12 ENCOUNTER — Ambulatory Visit: Payer: 59

## 2016-01-12 DIAGNOSIS — C50412 Malignant neoplasm of upper-outer quadrant of left female breast: Secondary | ICD-10-CM | POA: Diagnosis not present

## 2016-01-12 NOTE — Assessment & Plan Note (Signed)
Doing well after lumpectomy and seed implant  Undergoing radiation tx now  Tolerating well  Will likely follow up with anti est tx  Has baseline dexa done

## 2016-01-12 NOTE — Assessment & Plan Note (Signed)
Reviewed health habits including diet and exercise and skin cancer prevention Reviewed appropriate screening tests for age  Also reviewed health mt list, fam hx and immunization status , as well as social and family history   See HPI Labs reviewed Try to get 1200-1500 mg of calcium per day with at least 1000 iu of vitamin D - for bone health  Exercise when you feel up to it  Get a flu shot every fall  Don't forget to schedule your colonoscopy at the end of the year  Take care of yourself  Try to work on a healthier diet

## 2016-01-12 NOTE — Assessment & Plan Note (Signed)
Pt is due for colonoscopy Will schedule at end of year (when done with breast ca tx) GI is aware

## 2016-01-12 NOTE — Assessment & Plan Note (Signed)
Discussed how this problem influences overall health and the risks it imposes  Reviewed plan for weight loss with lower calorie diet (via better food choices and also portion control or program like weight watchers) and exercise building up to or more than 30 minutes 5 days per week including some aerobic activity    

## 2016-01-13 ENCOUNTER — Ambulatory Visit: Payer: 59

## 2016-01-13 ENCOUNTER — Ambulatory Visit
Admission: RE | Admit: 2016-01-13 | Discharge: 2016-01-13 | Disposition: A | Discharge status: Home / Self Care | Payer: 59 | Source: Ambulatory Visit | Attending: Radiation Oncology | Admitting: Radiation Oncology

## 2016-01-13 DIAGNOSIS — C50412 Malignant neoplasm of upper-outer quadrant of left female breast: Secondary | ICD-10-CM | POA: Diagnosis not present

## 2016-01-16 ENCOUNTER — Ambulatory Visit
Admission: RE | Admit: 2016-01-16 | Discharge: 2016-01-16 | Disposition: A | Discharge status: Home / Self Care | Payer: 59 | Source: Ambulatory Visit | Attending: Radiation Oncology | Admitting: Radiation Oncology

## 2016-01-16 ENCOUNTER — Ambulatory Visit: Payer: 59

## 2016-01-16 DIAGNOSIS — C50412 Malignant neoplasm of upper-outer quadrant of left female breast: Secondary | ICD-10-CM | POA: Diagnosis not present

## 2016-01-17 ENCOUNTER — Ambulatory Visit: Payer: 59

## 2016-01-17 ENCOUNTER — Encounter (HOSPITAL_BASED_OUTPATIENT_CLINIC_OR_DEPARTMENT_OTHER): Payer: Self-pay

## 2016-01-17 ENCOUNTER — Ambulatory Visit
Admission: RE | Admit: 2016-01-17 | Discharge: 2016-01-17 | Disposition: A | Discharge status: Home / Self Care | Payer: 59 | Source: Ambulatory Visit | Attending: Radiation Oncology | Admitting: Radiation Oncology

## 2016-01-17 DIAGNOSIS — C50412 Malignant neoplasm of upper-outer quadrant of left female breast: Secondary | ICD-10-CM | POA: Diagnosis not present

## 2016-01-18 ENCOUNTER — Ambulatory Visit
Admission: RE | Admit: 2016-01-18 | Discharge: 2016-01-18 | Disposition: A | Discharge status: Home / Self Care | Payer: 59 | Source: Ambulatory Visit | Attending: Radiation Oncology | Admitting: Radiation Oncology

## 2016-01-18 ENCOUNTER — Ambulatory Visit: Payer: 59

## 2016-01-18 DIAGNOSIS — C50412 Malignant neoplasm of upper-outer quadrant of left female breast: Secondary | ICD-10-CM | POA: Diagnosis not present

## 2016-01-19 ENCOUNTER — Ambulatory Visit
Admission: RE | Admit: 2016-01-19 | Discharge: 2016-01-19 | Disposition: A | Discharge status: Home / Self Care | Payer: 59 | Source: Ambulatory Visit | Attending: Radiation Oncology | Admitting: Radiation Oncology

## 2016-01-19 ENCOUNTER — Inpatient Hospital Stay: Payer: 59 | Attending: Radiation Oncology

## 2016-01-19 ENCOUNTER — Ambulatory Visit: Payer: 59

## 2016-01-19 ENCOUNTER — Other Ambulatory Visit: Payer: Self-pay

## 2016-01-19 DIAGNOSIS — C50412 Malignant neoplasm of upper-outer quadrant of left female breast: Secondary | ICD-10-CM | POA: Insufficient documentation

## 2016-01-19 LAB — CBC
HEMATOCRIT: 35.6 % (ref 35.0–47.0)
HEMOGLOBIN: 11.8 g/dL — AB (ref 12.0–16.0)
MCH: 27.5 pg (ref 26.0–34.0)
MCHC: 33.1 g/dL (ref 32.0–36.0)
MCV: 83.1 fL (ref 80.0–100.0)
Platelets: 229 10*3/uL (ref 150–440)
RBC: 4.28 MIL/uL (ref 3.80–5.20)
RDW: 14.3 % (ref 11.5–14.5)
WBC: 2.6 10*3/uL — ABNORMAL LOW (ref 3.6–11.0)

## 2016-01-20 ENCOUNTER — Ambulatory Visit
Admission: RE | Admit: 2016-01-20 | Discharge: 2016-01-20 | Disposition: A | Discharge status: Home / Self Care | Payer: 59 | Source: Ambulatory Visit | Attending: Radiation Oncology | Admitting: Radiation Oncology

## 2016-01-20 DIAGNOSIS — C50412 Malignant neoplasm of upper-outer quadrant of left female breast: Secondary | ICD-10-CM | POA: Diagnosis not present

## 2016-01-23 ENCOUNTER — Other Ambulatory Visit: Payer: Self-pay | Admitting: *Deleted

## 2016-01-23 ENCOUNTER — Ambulatory Visit
Admission: RE | Admit: 2016-01-23 | Discharge: 2016-01-23 | Disposition: A | Discharge status: Home / Self Care | Payer: 59 | Source: Ambulatory Visit | Attending: Radiation Oncology | Admitting: Radiation Oncology

## 2016-01-23 DIAGNOSIS — C50412 Malignant neoplasm of upper-outer quadrant of left female breast: Secondary | ICD-10-CM | POA: Diagnosis not present

## 2016-01-23 MED ORDER — SILVER SULFADIAZINE 1 % EX CREA: 1.0000 "application " | TOPICAL_CREAM | Freq: Two times a day (BID) | CUTANEOUS | 0 refills | Status: DC

## 2016-01-24 ENCOUNTER — Ambulatory Visit
Admission: RE | Admit: 2016-01-24 | Discharge: 2016-01-24 | Disposition: A | Discharge status: Home / Self Care | Payer: 59 | Source: Ambulatory Visit | Attending: Radiation Oncology | Admitting: Radiation Oncology

## 2016-01-24 DIAGNOSIS — C50412 Malignant neoplasm of upper-outer quadrant of left female breast: Secondary | ICD-10-CM | POA: Diagnosis not present

## 2016-01-25 ENCOUNTER — Ambulatory Visit: Payer: 59

## 2016-01-25 ENCOUNTER — Ambulatory Visit
Admission: RE | Admit: 2016-01-25 | Discharge: 2016-01-25 | Disposition: A | Discharge status: Home / Self Care | Payer: 59 | Source: Ambulatory Visit | Attending: Radiation Oncology | Admitting: Radiation Oncology

## 2016-01-26 ENCOUNTER — Ambulatory Visit
Admission: RE | Admit: 2016-01-26 | Discharge: 2016-01-26 | Disposition: A | Discharge status: Home / Self Care | Payer: 59 | Source: Ambulatory Visit | Attending: Radiation Oncology | Admitting: Radiation Oncology

## 2016-01-26 ENCOUNTER — Ambulatory Visit: Payer: 59

## 2016-01-26 ENCOUNTER — Ambulatory Visit: Payer: 59 | Admitting: Hematology and Oncology

## 2016-01-27 ENCOUNTER — Ambulatory Visit: Payer: 59

## 2016-01-27 ENCOUNTER — Ambulatory Visit
Admission: RE | Admit: 2016-01-27 | Discharge: 2016-01-27 | Disposition: A | Discharge status: Home / Self Care | Payer: 59 | Source: Ambulatory Visit | Attending: Radiation Oncology | Admitting: Radiation Oncology

## 2016-01-30 ENCOUNTER — Ambulatory Visit
Admission: RE | Admit: 2016-01-30 | Discharge: 2016-01-30 | Disposition: A | Discharge status: Home / Self Care | Payer: 59 | Source: Ambulatory Visit | Attending: Radiation Oncology | Admitting: Radiation Oncology

## 2016-01-30 DIAGNOSIS — C50412 Malignant neoplasm of upper-outer quadrant of left female breast: Secondary | ICD-10-CM | POA: Diagnosis not present

## 2016-01-31 ENCOUNTER — Ambulatory Visit
Admission: RE | Admit: 2016-01-31 | Discharge: 2016-01-31 | Disposition: A | Discharge status: Home / Self Care | Payer: 59 | Source: Ambulatory Visit | Attending: Radiation Oncology | Admitting: Radiation Oncology

## 2016-01-31 DIAGNOSIS — C50412 Malignant neoplasm of upper-outer quadrant of left female breast: Secondary | ICD-10-CM | POA: Diagnosis not present

## 2016-02-01 ENCOUNTER — Ambulatory Visit
Admission: RE | Admit: 2016-02-01 | Discharge: 2016-02-01 | Disposition: A | Discharge status: Home / Self Care | Payer: 59 | Source: Ambulatory Visit | Attending: Radiation Oncology | Admitting: Radiation Oncology

## 2016-02-01 DIAGNOSIS — C50412 Malignant neoplasm of upper-outer quadrant of left female breast: Secondary | ICD-10-CM | POA: Diagnosis not present

## 2016-02-02 ENCOUNTER — Ambulatory Visit
Admission: RE | Admit: 2016-02-02 | Discharge: 2016-02-02 | Disposition: A | Discharge status: Home / Self Care | Payer: 59 | Source: Ambulatory Visit | Attending: Radiation Oncology | Admitting: Radiation Oncology

## 2016-02-02 DIAGNOSIS — C50412 Malignant neoplasm of upper-outer quadrant of left female breast: Secondary | ICD-10-CM | POA: Diagnosis not present

## 2016-02-03 ENCOUNTER — Telehealth: Payer: Self-pay | Admitting: *Deleted

## 2016-02-03 ENCOUNTER — Ambulatory Visit
Admission: RE | Admit: 2016-02-03 | Discharge: 2016-02-03 | Disposition: A | Discharge status: Home / Self Care | Payer: 59 | Source: Ambulatory Visit | Attending: Radiation Oncology | Admitting: Radiation Oncology

## 2016-02-03 DIAGNOSIS — C50412 Malignant neoplasm of upper-outer quadrant of left female breast: Secondary | ICD-10-CM

## 2016-02-03 NOTE — Telephone Encounter (Signed)
  Oncology Nurse Navigator Documentation  Navigator Location: [facility] (02/03/16 1500) Navigator Encounter Type: Telephone (02/03/16 1500) Telephone: Outgoing Call (02/03/16 1500)         Patient Visit Type: RadOnc (02/03/16 1500) Treatment Phase: Final Radiation Tx (02/03/16 1500)                            Time Spent with Patient: 15 (02/03/16 1500)

## 2016-02-07 ENCOUNTER — Ambulatory Visit (HOSPITAL_BASED_OUTPATIENT_CLINIC_OR_DEPARTMENT_OTHER): Payer: 59 | Admitting: Hematology and Oncology

## 2016-02-07 ENCOUNTER — Encounter: Payer: Self-pay | Admitting: Hematology and Oncology

## 2016-02-07 DIAGNOSIS — C773 Secondary and unspecified malignant neoplasm of axilla and upper limb lymph nodes: Secondary | ICD-10-CM | POA: Diagnosis not present

## 2016-02-07 DIAGNOSIS — C50412 Malignant neoplasm of upper-outer quadrant of left female breast: Secondary | ICD-10-CM

## 2016-02-07 MED ORDER — ANASTROZOLE 1 MG PO TABS: 1.0000 mg | ORAL_TABLET | Freq: Every day | ORAL | 3 refills | Status: DC

## 2016-02-07 NOTE — Progress Notes (Signed)
Patient Care Team: Abner Greenspan, MD as PCP - General  SUMMARY OF ONCOLOGIC HISTORY:   Breast cancer of upper-outer quadrant of left female breast ([place])   09/28/2015 Initial Diagnosis    Left breast biopsy 2:00 position: IDC with DCIS, grade 1, ER 100%, PR 20%, Ki-67 10%, HER-2 negative ratio 1.32, MRI revealed 7 x 5 x 10 mm in the posterior third left breast, T1 cN0 stage IA clinical stage      11/02/2015 Surgery    Left lumpectomy: IDC with DCIS, 1 cm, margins negative, 1/3 lymph nodes positive, ER 100%, PR 20%, Ki-67 10%, HER-2 negative ratio 1.32, T1bN1 stage IIA, Mammaprint luminal-type A, low risk      12/07/2015 - 02/03/2016 Radiation Therapy    Adjuvant radiation therapy      02/07/2016 -  Anti-estrogen oral therapy    Adjuvant antiestrogen therapy       CHIEF COMPLIANT: Follow-up after radiation therapy  INTERVAL HISTORY: Jamie Shaffer is a 55 year old with above-mentioned history of left breast cancer treated with lumpectomy and radiation and is here today to start adjuvant antiestrogen therapy. She has done extremely well from the radiation standpoint. She has minimal discomfort. Redness of the skin is present. Mild fatigue.  REVIEW OF SYSTEMS:   Constitutional: Denies fevers, chills or abnormal weight loss Eyes: Denies blurriness of vision Ears, nose, mouth, throat, and face: Denies mucositis or sore throat Respiratory: Denies cough, dyspnea or wheezes Cardiovascular: Denies palpitation, chest discomfort Gastrointestinal:  Denies nausea, heartburn or change in bowel habits Skin: Denies abnormal skin rashes Lymphatics: Denies new lymphadenopathy or easy bruising Neurological:Denies numbness, tingling or new weaknesses Behavioral/Psych: Mood is stable, no new changes  Extremities: No lower extremity edema Breast: Radiation dermatitis Moderate All other systems were reviewed with the patient and are negative.  I have reviewed the past medical history, past  surgical history, social history and family history with the patient and they are unchanged from previous note.  ALLERGIES:  has No Known Allergies.  MEDICATIONS:  Current Outpatient Prescriptions  Medication Sig Dispense Refill  . ALPRAZolam (XANAX) 0.5 MG tablet TAKE 1 TABLET BY MOUTH DAILY AS NEEDED FOR ANXIETY WHILE TRAVELLING 20 tablet 0  . anastrozole (ARIMIDEX) 1 MG tablet Take 1 tablet (1 mg total) by mouth daily. 90 tablet 3  . Multiple Vitamin (MULTIVITAMIN) tablet Take 1 tablet by mouth daily.      . silver sulfADIAZINE (SILVADENE) 1 % cream Apply 1 application topically 2 (two) times daily. Apply to affected area 400 g 0   No current facility-administered medications for this visit.     PHYSICAL EXAMINATION: ECOG PERFORMANCE STATUS: 1 - Symptomatic but completely ambulatory  Vitals:   02/07/16 0829  BP: 131/71  Pulse: 78  Resp: 18  Temp: 98.4 F (36.9 C)   Filed Weights   02/07/16 0829  Weight: 171 lb 12.8 oz (77.9 kg)    GENERAL:alert, no distress and comfortable SKIN: skin color, texture, turgor are normal, no rashes or significant lesions EYES: normal, Conjunctiva are pink and non-injected, sclera clear OROPHARYNX:no exudate, no erythema and lips, buccal mucosa, and tongue normal  NECK: supple, thyroid normal size, non-tender, without nodularity LYMPH:  no palpable lymphadenopathy in the cervical, axillary or inguinal LUNGS: clear to auscultation and percussion with normal breathing effort HEART: regular rate & rhythm and no murmurs and no lower extremity edema ABDOMEN:abdomen soft, non-tender and normal bowel sounds MUSCULOSKELETAL:no cyanosis of digits and no clubbing  NEURO: alert & oriented x  3 with fluent speech, no focal motor/sensory deficits EXTREMITIES: No lower extremity edema  LABORATORY DATA:  I have reviewed the data as listed   Chemistry      Component Value Date/Time   NA 141 01/04/2016 0811   K 4.1 01/04/2016 0811   CL 106 01/04/2016  0811   CO2 30 01/04/2016 0811   BUN 13 01/04/2016 0811   CREATININE 0.68 01/04/2016 0811   CREATININE 0.72 01/16/2013 1639      Component Value Date/Time   CALCIUM 9.2 01/04/2016 0811   ALKPHOS 112 01/04/2016 0811   AST 40 (H) 01/04/2016 0811   ALT 41 (H) 01/04/2016 0811   BILITOT 0.5 01/04/2016 0811       Lab Results  Component Value Date   WBC 2.6 (L) 01/19/2016   HGB 11.8 (L) 01/19/2016   HCT 35.6 01/19/2016   MCV 83.1 01/19/2016   PLT 229 01/19/2016   NEUTROABS 1.9 01/04/2016     ASSESSMENT & PLAN:  Breast cancer of upper-outer quadrant of left female breast ([place]) Left lumpectomy: IDC with DCIS, 1 cm, margins negative, 1/3 lymph nodes positive, ER 100%, PR 20%, Ki-67 10%, HER-2 negative ratio 1.32, T1bN1 stage II A Mammaprint: Luminal type A, low risk, did not need chemotherapy. Adjuvant radiation therapy from 11/28/2015 to 02/03/2016  Recommendation: Adjuvant antiestrogen therapy With anastrozole 1 mg daily  I counseled her regarding participation in [place] clinical trial  with Ibrance   [facility] clinical trial counseling: Patients who have completed definitive therapy for breast cancer are randomized to antiestrogen therapy (5+ years) versus antiestrogen therapy plus Palbociclib (2 years). Palbociclib: If she was randomized to Palbociclib, I discussed the risks and benefits of Ibrance including myelosuppression especially neutropenia and with that risk of infection, there is risk of pulmonary embolism and mild peripheral neuropathy as well. Fatigue, nausea, diarrhea, decreased appetite as well as alopecia and thrombocytopenia are also potential side effects of Palbociclib.  Return to clinic in 3 months to assess tolerability to anti-estrogens and determine if she wants to participate in the clinical trial.  No orders of the defined types were placed in this encounter.  The patient has a good understanding of the overall plan. she agrees with it. she will call with any  problems that may develop before the next visit here.   Rulon Eisenmenger, MD 02/07/16

## 2016-02-07 NOTE — Assessment & Plan Note (Signed)
Left lumpectomy: IDC with DCIS, 1 cm, margins negative, 1/3 lymph nodes positive, ER 100%, PR 20%, Ki-67 10%, HER-2 negative ratio 1.32, T1bN1 stage II A Mammaprint: Luminal type A, low risk, did not need chemotherapy. Adjuvant radiation therapy from 11/28/2015 to 02/03/2016  Recommendation: Adjuvant antiestrogen therapy I counseled her regarding participation in [place] clinical trial  with Ibrance   [facility] clinical trial counseling: Patients who have completed definitive therapy for breast cancer are randomized to antiestrogen therapy (5+ years) versus antiestrogen therapy plus Palbociclib (2 years). Palbociclib: If she was randomized to Palbociclib, I discussed the risks and benefits of Ibrance including myelosuppression especially neutropenia and with that risk of infection, there is risk of pulmonary embolism and mild peripheral neuropathy as well. Fatigue, nausea, diarrhea, decreased appetite as well as alopecia and thrombocytopenia are also potential side effects of Palbociclib.   Return to clinic in 3 months to assess tolerability to anti-estrogens and determine if she wants to participate in the clinical trial.

## 2016-03-14 ENCOUNTER — Ambulatory Visit: Payer: Self-pay | Admitting: Radiation Oncology

## 2016-03-20 ENCOUNTER — Ambulatory Visit: Payer: 59 | Admitting: Radiation Oncology

## 2016-03-27 ENCOUNTER — Ambulatory Visit: Payer: 59 | Admitting: Radiation Oncology

## 2016-04-10 ENCOUNTER — Ambulatory Visit: Payer: 59 | Attending: General Surgery | Admitting: Occupational Therapy

## 2016-04-10 DIAGNOSIS — R600 Localized edema: Secondary | ICD-10-CM | POA: Diagnosis not present

## 2016-04-10 NOTE — Therapy (Signed)
[facility] [address] [place], [address] Phone: [phone]   Fax:  [phone]  Occupational Therapy Evaluation  Patient Details  Name: Jamie Shaffer MRN: [id number] Date of Birth: [birth year] No Data Recorded  Encounter Date: 04/10/2016      OT End of Session - 04/10/16 1959    Visit Number 1   Number of Visits 4   Date for OT Re-Evaluation 05/08/16   OT Start Time 1324   OT Stop Time 1420   OT Time Calculation (min) 56 min   Activity Tolerance Patient tolerated treatment well   Behavior During Therapy [facility] for tasks assessed/performed      Past Medical History:  Diagnosis Date  . Cancer ([place]) 10-2015   left breast  . Fibrocystic breast   . GERD (gastroesophageal reflux disease)   . Panic disorder   . Post-menopausal     Past Surgical History:  Procedure Laterality Date  . BREAST LUMPECTOMY WITH RADIOACTIVE SEED AND SENTINEL LYMPH NODE BIOPSY Left 11/02/2015   Procedure: BREAST LUMPECTOMY WITH RADIOACTIVE SEED AND SENTINEL LYMPH NODE BIOPSY;  Surgeon: [name], MD;  Location: [facility];  Service: General;  Laterality: Left;  . CESAREAN SECTION    . fibroid tumor    . PILONIDAL CYST EXCISION    . VAGINAL HYSTERECTOMY     partial    There were no vitals filed for this visit.      Subjective Assessment - 04/10/16 1948    Subjective  I finished radiation in  Aug and  fly to [place] in Sept- did some exercises like push ups and planks - my breast started getting tighter, swollen, and painfull - waited until I come back to see MD - they drained since then 3 times  my breast and now refer to you    Patient Stated Goals Want my breast not to be so heavy , tight  - or feel so hard - and pain better  - so I can hold granddaughter, exercise in gym again, sleep better    Currently in Pain? Yes   Pain Score 4    Pain Location Breast   Pain Orientation Left   Pain  Descriptors / Indicators Stabbing             LYMPHEDEMA/ONCOLOGY QUESTIONNAIRE - 04/10/16 1953      Surgeries   Lumpectomy Date 11/02/15   Number Lymph Nodes Removed 2     Date Lymphedema/Swelling Started   Date 03/08/16     Treatment   Past Radiation Treatment Yes   Body Site L breast     What other symptoms do you have   Are you Having Heaviness or Tightness Yes   Other Symptoms Breast feel tight , full and heavy     Lymphedema Stage   Stage STAGE 1 SPONTANEOUSLY REVERSIBLE     Right Upper Extremity Lymphedema   15 cm Proximal to Olecranon Process 34 cm   10 cm Proximal to Olecranon Process 32 cm   Olecranon Process 25.8 cm   15 cm Proximal to Ulnar Styloid Process 24.5 cm   10 cm Proximal to Ulnar Styloid Process 20.6 cm   Just Proximal to Ulnar Styloid Process 16 cm   Across Hand at [employer] 19.7 cm   At [place] of 2nd Digit 6.5 cm   At [facility] of Thumb 7 cm     Left Upper Extremity Lymphedema   15  cm Proximal to Olecranon Process 33.7 cm   10 cm Proximal to Olecranon Process 31.7 cm   Olecranon Process 25.5 cm   15 cm Proximal to Ulnar Styloid Process 24 cm   10 cm Proximal to Ulnar Styloid Process 20.5 cm   Just Proximal to Ulnar Styloid Process 15.7 cm   Across Hand at [employer] 19 cm   At [place] of 2nd Digit 6.5 cm   At [facility] of Thumb 7 cm        Pt to do some fibrotic tech to upper quadrant of L breast - 2 x day for 2-3 min  And compression foam provided to wear inside of bra - start out for 2 hrs, and then 3 hrs If okay - can do night time - and daytime - if not increasing pain                  OT Education - 04/10/16 1959    Education provided Yes   Education Details findings - lymphedema - and homeprogram   Person(s) Educated Patient   Methods Explanation;Demonstration;Tactile cues;Verbal cues;Handout   Comprehension Verbal cues required;Returned demonstration;Verbalized understanding             OT Long Term Goals  - 04/10/16 2025      OT LONG TERM GOAL #1   Title Pt's  L breast measurement decrease by 2 cm from top to bottom and report less heaviness   Baseline 4 cm increase   Time 2   Period Weeks   Status New     OT LONG TERM GOAL #2   Title Pain , firmness, tightness decrease to 1/10 - to reach over head, pick up 59 month old grand daughter , start working out again    Baseline pain at the worse 8/10 and session start 2 10  - tight with over head reaching - not able to work out    Time Many - 04/10/16 2010    Clinical Impression Statement Pt present with report of L breast being tight, hard, swollen, heavy  since Sept - she finished radiation in Aug and flew to [place] in Sept - did some workouts - and over that time it started swelling- she  had some aspiration by MD 3 x since she come back - refer to lymphedema cliinic - scar mobs appear good - but L breast upper quadrant tight, firm , and some what painfull - pt measured about  4 cm increase from top of breast to bottom -  mediall to lateral  same  - pt was provided compression foam to wear in bra and then ed on fibrotic techniques to do at home 2 x day - will reassess in week     Rehab Potential Good   OT Frequency 1x / week   OT Duration 4 weeks   OT Treatment/Interventions Self-care/ADL training;Manual lymph drainage;Scar mobilization;Patient/family education   Plan Reassess in week    OT Home Exercise Plan see pt instruction    Consulted and Agree with Plan of Care Patient      Patient will benefit from skilled therapeutic intervention in order to improve the following deficits and impairments:  Decreased skin integrity, Pain, Increased edema  Visit Diagnosis: Localized edema - Plan: Ot plan of care cert/re-cert    Problem List Patient Active Problem List  Diagnosis Date Noted  . Breast cancer of upper-outer quadrant of left female breast ([place]) 10/24/2015  . Genetic testing  10/24/2015  . Family history of breast cancer in first degree relative 10/04/2015  . Family history of colon cancer 10/04/2015  . Chest pain 09/20/2014  . Acid reflux 09/20/2014  . Obesity 09/20/2014  . Menopausal symptoms 01/16/2013  . Pilonidal cyst 07/09/2012  . Routine general medical examination at a health care facility 02/04/2011  . FIBROCYSTIC BREAST DISEASE 10/20/2007  . PANIC DISORDER 10/17/2007    Rosalyn Gess OTR/L,CLT 04/10/2016, 8:31 PM  [facility] [address] [place], [address] Phone: [phone]   Fax:  [phone]  Name: Jamie Shaffer MRN: [id number] Date of Birth: 10-01-1960

## 2016-04-10 NOTE — Patient Instructions (Signed)
Pt to do some fibrotic tech to upper quadrant of L breast - 2 x day for 2-3 min  And compression foam provided to wear inside of bra - start out for 2 hrs, and then 3 hrs If okay - can do night time - and daytime - if not increasing pain

## 2016-04-13 ENCOUNTER — Ambulatory Visit: Payer: 59 | Admitting: Occupational Therapy

## 2016-04-16 ENCOUNTER — Encounter: Payer: Self-pay | Admitting: Gastroenterology

## 2016-04-18 ENCOUNTER — Ambulatory Visit: Payer: 59 | Admitting: Occupational Therapy

## 2016-04-24 ENCOUNTER — Ambulatory Visit: Payer: 59 | Admitting: Radiation Oncology

## 2016-04-25 ENCOUNTER — Ambulatory Visit: Payer: 59 | Attending: General Surgery | Admitting: Occupational Therapy

## 2016-04-25 DIAGNOSIS — R600 Localized edema: Secondary | ICD-10-CM | POA: Diagnosis present

## 2016-04-25 NOTE — Patient Instructions (Signed)
Cont with fibrosis - but more lateral towards scar  Jovipak unilateral post mastectomy pad get measure for

## 2016-04-25 NOTE — Therapy (Signed)
[facility] [address], [place], [address] Phone: [phone]   Fax:  [phone]  Occupational Therapy Treatment  Patient Details  Name: Jamie Shaffer MRN: [id number] Date of Birth: 10/31/1960 No Data Recorded  Encounter Date: 04/25/2016      OT End of Session - 04/25/16 1547    Visit Number 2   Number of Visits 4   Date for OT Re-Evaluation 05/08/16   OT Start Time 0845   OT Stop Time 0927   OT Time Calculation (min) 42 min   Activity Tolerance Patient tolerated treatment well   Behavior During Therapy [facility] for tasks assessed/performed      Past Medical History:  Diagnosis Date  . Cancer ([place]) 10-2015   left breast  . Fibrocystic breast   . GERD (gastroesophageal reflux disease)   . Panic disorder   . Post-menopausal     Past Surgical History:  Procedure Laterality Date  . BREAST LUMPECTOMY WITH RADIOACTIVE SEED AND SENTINEL LYMPH NODE BIOPSY Left 11/02/2015   Procedure: BREAST LUMPECTOMY WITH RADIOACTIVE SEED AND SENTINEL LYMPH NODE BIOPSY;  Surgeon: [name], MD;  Location: [facility];  Service: General;  Laterality: Left;  . CESAREAN SECTION    . fibroid tumor    . PILONIDAL CYST EXCISION    . VAGINAL HYSTERECTOMY     partial    There were no vitals filed for this visit.      Subjective Assessment - 04/25/16 1537    Subjective  Breast feeling better - my husband was helping me with some of that massage you showed me - breast feel softer but if  I reach over head with my arm, I feel pull under my armpit   Patient Stated Goals Want my breast not to be so heavy , tight  - or feel so hard - and pain better  - so I can hold granddaughter, exercise in gym again, sleep better    Currently in Pain? Yes   Pain Score 1    Pain Location Axilla   Pain Orientation Left   Pain Descriptors / Indicators Tightness   Pain Type Acute pain      Fibrotic tech on L breast  above nipple and lateral to scar and under neath scar Pt response great - and fibrosis and tenderness better above but still lateral and towards scar - pt and husband to focus on that   Pt report pain and pull with over head reaching with L arm Graston tool nr 4 done for sweeping and scooping in axilla , subscapularis , serratus and lats - pain and tenderness decrease  And pt show increase flexion of shoulder with no pull and tightness  AROM  Shoulder flexion 10 reps isometric end range    Measurements decrease on L breast for top to bottom by 1 cm  And circumference for L and R upper arm - same  Recommended jovipak unilateral post mastectomy pad to wear at night time and daytime as needed under bra or camisole                           OT Education - 04/25/16 1547    Education provided Yes   Education Details fibrotic tech , and order [employer]) Educated Patient   Methods Explanation;Demonstration;Tactile cues;Verbal cues   Comprehension Verbal cues required;Returned demonstration;Verbalized understanding  OT Long Term Goals - 04/10/16 2025      OT LONG TERM GOAL #1   Title Pt's  L breast measurement decrease by 2 cm from top to bottom and report less heaviness   Baseline 4 cm increase   Time 2   Period Weeks   Status New     OT LONG TERM GOAL #2   Title Pain , firmness, tightness decrease to 1/10 - to reach over head, pick up 51 month old grand daughter , start working out again    Baseline pain at the worse 8/10 and session start 2 10  - tight with over head reaching - not able to work out    Time 4   Period Weeks   Status New             Patient will benefit from skilled therapeutic intervention in order to improve the following deficits and impairments:     Visit Diagnosis: Localized edema    Problem List Patient Active Problem List   Diagnosis Date Noted  . Breast cancer of upper-outer quadrant of left female  breast ([place]) 10/24/2015  . Genetic testing 10/24/2015  . Family history of breast cancer in first degree relative 10/04/2015  . Family history of colon cancer 10/04/2015  . Chest pain 09/20/2014  . Acid reflux 09/20/2014  . Obesity 09/20/2014  . Menopausal symptoms 01/16/2013  . Pilonidal cyst 07/09/2012  . Routine general medical examination at a health care facility 02/04/2011  . FIBROCYSTIC BREAST DISEASE 10/20/2007  . PANIC DISORDER 10/17/2007    Rosalyn Gess OTR/L,CLT 04/25/2016, 5:11 PM  [facility] [address] [place], [address] Phone: [phone]   Fax:  [phone]  Name: Jamie Shaffer MRN: [id number] Date of Birth: [birth year]

## 2016-04-27 ENCOUNTER — Encounter (HOSPITAL_COMMUNITY): Payer: Self-pay

## 2016-05-01 ENCOUNTER — Ambulatory Visit
Admission: RE | Admit: 2016-05-01 | Discharge: 2016-05-01 | Disposition: A | Discharge status: Home / Self Care | Payer: 59 | Source: Ambulatory Visit | Attending: Radiation Oncology | Admitting: Radiation Oncology

## 2016-05-01 ENCOUNTER — Encounter: Payer: Self-pay | Admitting: Radiation Oncology

## 2016-05-01 VITALS — BP 137/92 | HR 78 | Temp 96.1°F | Resp 20 | Wt 176.3 lb

## 2016-05-01 DIAGNOSIS — N6320 Unspecified lump in the left breast, unspecified quadrant: Secondary | ICD-10-CM | POA: Insufficient documentation

## 2016-05-01 DIAGNOSIS — C50412 Malignant neoplasm of upper-outer quadrant of left female breast: Secondary | ICD-10-CM

## 2016-05-01 DIAGNOSIS — Z170 Estrogen receptor positive status [ER+]: Secondary | ICD-10-CM | POA: Diagnosis not present

## 2016-05-01 DIAGNOSIS — Z923 Personal history of irradiation: Secondary | ICD-10-CM | POA: Insufficient documentation

## 2016-05-01 DIAGNOSIS — C50912 Malignant neoplasm of unspecified site of left female breast: Secondary | ICD-10-CM | POA: Diagnosis present

## 2016-05-01 DIAGNOSIS — Z79811 Long term (current) use of aromatase inhibitors: Secondary | ICD-10-CM | POA: Diagnosis not present

## 2016-05-01 NOTE — Progress Notes (Signed)
Radiation Oncology Follow up Note  Name: Jamie Shaffer   Date:   05/01/2016 MRN:  [id number] DOB: 12/25/1960    This 55 y.o. female presents to the clinic today for 5 month follow-up status post whole breast radiation to her left breast for a stage IIa invasive mammary carcinoma status post wide local excision and sentinel node biopsy.  REFERRING PROVIDER: Tower, [name], MD  HPI: Patient is a 55 year old female now out 5 months having completed whole breast radiation to her left breast for stage IIa (T1 CN I M0) ER/PR positive HER-2/neu negative invasive mammary carcinoma not given chemotherapy based on low risk of recurrence based on MammaPrint. She seen today in routine follow-up. She states she's been having some swelling in the left breast and is seen PT for that no evidence of lymphedema in her left upper extremity. She is also currently anastrozole and tolerating that well without side effect. She specifically denies cough or bone pain.   COMPLICATIONS OF TREATMENT: none  FOLLOW UP COMPLIANCE: keeps appointments   PHYSICAL EXAM:  BP (!) 137/92   Pulse 78   Temp (!) 96.1 F (35.6 C)   Resp 20   Wt 176 lb 4.1 oz (79.9 kg)   BMI 31.22 kg/m  Left breast is somewhat firm no evidence of mass or nodularity in either breast is noted. No tenderness on deep palpation is noted. No axillary or supraclavicular adenopathy is identified. No evidence of lymphedema of her left upper extremity is noted. Well-developed well-nourished patient in NAD. HEENT reveals PERLA, EOMI, discs not visualized.  Oral cavity is clear. No oral mucosal lesions are identified. Neck is clear without evidence of cervical or supraclavicular adenopathy. Lungs are clear to A&P. Cardiac examination is essentially unremarkable with regular rate and rhythm without murmur rub or thrill. Abdomen is benign with no organomegaly or masses noted. Motor sensory and DTR levels are equal and symmetric in the upper and lower  extremities. Cranial nerves II through XII are grossly intact. Proprioception is intact. No peripheral adenopathy or edema is identified. No motor or sensory levels are noted. Crude visual fields are within normal range.  RADIOLOGY RESULTS: Mammograms are ordered for next month.  PLAN: At the present time patient is doing well with no evidence of disease. I've assured her the breast swelling again will continue to rescind over time and is most likely fluid in her seroma cavity. She has no evidence of lymphedema. I've also explained to her the color of her skin will lighten overtime. Otherwise I've asked to see her back in 6 months for follow-up. Will review her mammogram when it becomes available.  I would like to take this opportunity to thank you for allowing me to participate in the care of your patient.Armstead Peaks., MD

## 2016-05-09 ENCOUNTER — Ambulatory Visit: Payer: 59 | Admitting: Hematology and Oncology

## 2016-05-14 ENCOUNTER — Ambulatory Visit: Payer: 59 | Admitting: Occupational Therapy

## 2016-05-21 NOTE — Assessment & Plan Note (Signed)
Left lumpectomy: IDC with DCIS, 1 cm, margins negative, 1/3 lymph nodes positive, ER 100%, PR 20%, Ki-67 10%, HER-2 negative ratio 1.32, T1bN1 stage II A Mammaprint: Luminal type A, low risk, did not need chemotherapy. Adjuvant radiation therapy from 11/28/2015 to 02/03/2016  Recommendation: Adjuvant antiestrogen therapy With anastrozole 1 mg daily started 02/07/16 Anastrozole Toxicities:  RTC in 6 months

## 2016-05-22 ENCOUNTER — Encounter: Payer: Self-pay | Admitting: Hematology and Oncology

## 2016-05-22 ENCOUNTER — Ambulatory Visit: Payer: 59 | Admitting: Occupational Therapy

## 2016-05-22 ENCOUNTER — Ambulatory Visit (HOSPITAL_BASED_OUTPATIENT_CLINIC_OR_DEPARTMENT_OTHER): Payer: 59 | Admitting: Hematology and Oncology

## 2016-05-22 DIAGNOSIS — R61 Generalized hyperhidrosis: Secondary | ICD-10-CM | POA: Diagnosis not present

## 2016-05-22 DIAGNOSIS — N951 Menopausal and female climacteric states: Secondary | ICD-10-CM | POA: Diagnosis not present

## 2016-05-22 DIAGNOSIS — R635 Abnormal weight gain: Secondary | ICD-10-CM | POA: Diagnosis not present

## 2016-05-22 DIAGNOSIS — C50412 Malignant neoplasm of upper-outer quadrant of left female breast: Secondary | ICD-10-CM

## 2016-05-22 DIAGNOSIS — Z170 Estrogen receptor positive status [ER+]: Principal | ICD-10-CM

## 2016-05-22 NOTE — Progress Notes (Signed)
Patient Care Team: Abner Greenspan, MD as PCP - General  DIAGNOSIS:  Encounter Diagnosis  Name Primary?  . Malignant neoplasm of upper-outer quadrant of left breast in female, estrogen receptor positive ([place])     SUMMARY OF ONCOLOGIC HISTORY:   Breast cancer of upper-outer quadrant of left female breast ([place])   09/28/2015 Initial Diagnosis    Left breast biopsy 2:00 position: IDC with DCIS, grade 1, ER 100%, PR 20%, Ki-67 10%, HER-2 negative ratio 1.32, MRI revealed 7 x 5 x 10 mm in the posterior third left breast, T1 cN0 stage IA clinical stage      11/02/2015 Surgery    Left lumpectomy: IDC with DCIS, 1 cm, margins negative, 1/3 lymph nodes positive, ER 100%, PR 20%, Ki-67 10%, HER-2 negative ratio 1.32, T1bN1 stage IIA, Mammaprint luminal-type A, low risk      12/07/2015 - 02/03/2016 Radiation Therapy    Adjuvant radiation therapy      02/07/2016 -  Anti-estrogen oral therapy    Adjuvant antiestrogen therapy       CHIEF COMPLIANT: Follow-up of adjuvant antiestrogen therapy with anastrozole  INTERVAL HISTORY: Jamie Shaffer is a 55 year old with above-mentioned history of left breast cancer treated with lumpectomy and radiation. She is currently on oral antiestrogen therapy with anastrozole. She has been on it for the past 3 months and appears to be tolerating it fairly well. She does have occasional hot flashes myalgias. She denies any lumps or nodules in the breasts. She has been forgetting to take the medication several times every month. She is taking care of her grandchildren and she aches a lot because of lying on the floor with them and sitting on the floor as well. She is also working and stays very busy. She reports that she has some difficulty with elevation of the left arm over the shoulder. She is continuing to get physical therapy. I discussed with her about range of motion exercises.  REVIEW OF SYSTEMS:   Constitutional: Denies fevers, chills or abnormal weight  loss Eyes: Denies blurriness of vision Ears, nose, mouth, throat, and face: Denies mucositis or sore throat Respiratory: Denies cough, dyspnea or wheezes Cardiovascular: Denies palpitation, chest discomfort Gastrointestinal:  Denies nausea, heartburn or change in bowel habits Skin: Denies abnormal skin rashes Lymphatics: Denies new lymphadenopathy or easy bruising Neurological:Denies numbness, tingling or new weaknesses Behavioral/Psych: Mood is stable, no new changes  Extremities: No lower extremity edema Breast:  denies any pain or lumps or nodules in either breasts All other systems were reviewed with the patient and are negative.  I have reviewed the past medical history, past surgical history, social history and family history with the patient and they are unchanged from previous note.  ALLERGIES:  has No Known Allergies.  MEDICATIONS:  Current Outpatient Prescriptions  Medication Sig Dispense Refill  . ALPRAZolam (XANAX) 0.5 MG tablet TAKE 1 TABLET BY MOUTH DAILY AS NEEDED FOR ANXIETY WHILE TRAVELLING 20 tablet 0  . anastrozole (ARIMIDEX) 1 MG tablet Take 1 tablet (1 mg total) by mouth daily. 90 tablet 3  . Multiple Vitamin (MULTIVITAMIN) tablet Take 1 tablet by mouth daily.      . silver sulfADIAZINE (SILVADENE) 1 % cream Apply 1 application topically 2 (two) times daily. Apply to affected area (Patient not taking: Reported on 05/01/2016) 400 g 0   No current facility-administered medications for this visit.     PHYSICAL EXAMINATION: ECOG PERFORMANCE STATUS: 1 - Symptomatic but completely ambulatory  Vitals:  05/22/16 0818  BP: 118/69  Pulse: 98  Resp: 17  Temp: 98.2 F (36.8 C)   Filed Weights   05/22/16 0818  Weight: 174 lb 9.6 oz (79.2 kg)    GENERAL:alert, no distress and comfortable SKIN: skin color, texture, turgor are normal, no rashes or significant lesions EYES: normal, Conjunctiva are pink and non-injected, sclera clear OROPHARYNX:no exudate, no  erythema and lips, buccal mucosa, and tongue normal  NECK: supple, thyroid normal size, non-tender, without nodularity LYMPH:  no palpable lymphadenopathy in the cervical, axillary or inguinal LUNGS: clear to auscultation and percussion with normal breathing effort HEART: regular rate & rhythm and no murmurs and no lower extremity edema ABDOMEN:abdomen soft, non-tender and normal bowel sounds MUSCULOSKELETAL:no cyanosis of digits and no clubbing  NEURO: alert & oriented x 3 with fluent speech, no focal motor/sensory deficits EXTREMITIES: No lower extremity edema  LABORATORY DATA:  I have reviewed the data as listed   Chemistry      Component Value Date/Time   NA 141 01/04/2016 0811   K 4.1 01/04/2016 0811   CL 106 01/04/2016 0811   CO2 30 01/04/2016 0811   BUN 13 01/04/2016 0811   CREATININE 0.68 01/04/2016 0811   CREATININE 0.72 01/16/2013 1639      Component Value Date/Time   CALCIUM 9.2 01/04/2016 0811   ALKPHOS 112 01/04/2016 0811   AST 40 (H) 01/04/2016 0811   ALT 41 (H) 01/04/2016 0811   BILITOT 0.5 01/04/2016 0811       Lab Results  Component Value Date   WBC 2.6 (L) 01/19/2016   HGB 11.8 (L) 01/19/2016   HCT 35.6 01/19/2016   MCV 83.1 01/19/2016   PLT 229 01/19/2016   NEUTROABS 1.9 01/04/2016    ASSESSMENT & PLAN:  Breast cancer of upper-outer quadrant of left female breast ([facility]) Left lumpectomy: IDC with DCIS, 1 cm, margins negative, 1/3 lymph nodes positive, ER 100%, PR 20%, Ki-67 10%, HER-2 negative ratio 1.32, T1bN1 stage II A Mammaprint: Luminal type A, low risk, did not need chemotherapy. Adjuvant radiation therapy from 11/28/2015 to 02/03/2016  Recommendation: Adjuvant antiestrogen therapy With anastrozole 1 mg daily started 02/07/16 Anastrozole Toxicities: 1. Intermittent night sweats  2. occasional hot flashes  3.complaining of weight gain    RTC in 6 months   No orders of the defined types were placed in this encounter.  The patient has a  good understanding of the overall plan. she agrees with it. she will call with any problems that may develop before the next visit here.   Rulon Eisenmenger, MD 05/22/16

## 2016-05-29 ENCOUNTER — Ambulatory Visit: Payer: 59 | Attending: General Surgery | Admitting: Occupational Therapy

## 2016-05-29 DIAGNOSIS — R600 Localized edema: Secondary | ICD-10-CM | POA: Insufficient documentation

## 2016-05-29 NOTE — Patient Instructions (Signed)
L breast in upper outer corner of L breast underneath scar fibrotic And ed on scar massage/mobs  pt and husband to focus on that   Recommended to cont with  jovipak unilateral post mastectomy pad to wear at night time and daytime as needed under bra or camisole   Pt ed on returning to workout for UE strengthening - but start with low reps and low weight - and increase gradually  Monitor arm and breast for week before increasing weight and reps

## 2016-05-29 NOTE — Therapy (Signed)
[facility] [address] [place], [address] Phone: [phone]   Fax:  [phone]  Occupational Therapy Treatment  Patient Details  Name: Jamie Shaffer MRN: [id number] Date of Birth: 10-Jul-1960 No Data Recorded  Encounter Date: 05/29/2016      OT End of Session - 05/29/16 0854    Visit Number 3   Number of Visits 4   Date for OT Re-Evaluation 06/26/16   OT Start Time 0808   OT Stop Time 0847   OT Time Calculation (min) 39 min   Activity Tolerance Patient tolerated treatment well   Behavior During Therapy [facility] for tasks assessed/performed      Past Medical History:  Diagnosis Date  . Cancer ([place]) 10-2015   left breast  . Fibrocystic breast   . GERD (gastroesophageal reflux disease)   . Panic disorder   . Post-menopausal     Past Surgical History:  Procedure Laterality Date  . BREAST LUMPECTOMY WITH RADIOACTIVE SEED AND SENTINEL LYMPH NODE BIOPSY Left 11/02/2015   Procedure: BREAST LUMPECTOMY WITH RADIOACTIVE SEED AND SENTINEL LYMPH NODE BIOPSY;  Surgeon: [name], MD;  Location: [facility];  Service: General;  Laterality: Left;  . CESAREAN SECTION    . fibroid tumor    . PILONIDAL CYST EXCISION    . VAGINAL HYSTERECTOMY     partial    There were no vitals filed for this visit.      Subjective Assessment - 05/29/16 0848    Subjective  Doing much better - no pain except maybe pull over head - but no always - or laying on that side - only hard place is close the the scar - sleeping and sometimes even wearing the  Jovipak under my bra at work - feels realy good - breast feel same size than the R one    Patient Stated Goals Want my breast not to be so heavy , tight  - or feel so hard - and pain better  - so I can hold granddaughter, exercise in gym again, sleep better    Currently in Pain? No/denies        Fibrotic tech on L breast in upper outer corner of L breast  underneath scar And ed on scar massage/mobs  Pt response great - and fibrosis and tenderness better - pt and husband to focus on that   Pt report slight pull with over head reaching with L arm in ABD - but very little  Measurements  L breast same than R from top to bottom and medial to lateral  And circumference for L and R upper arm - same see flowsheet Recommended to cont with  jovipak unilateral post mastectomy pad to wear at night time and daytime as needed under bra or camisole   Pt ed on returning to workout for UE strengthening - but start with low reps and low weight - and increase gradually  Monitor arm and breast for week before increasing weight and reps                         OT Education - 05/29/16 0854    Education provided Yes   Education Details HEP for next month   Person(s) Educated Patient   Methods Explanation;Demonstration;Tactile cues;Verbal cues   Comprehension Verbal cues required;Returned demonstration;Verbalized understanding             OT Long Term Goals -  05/29/16 0858      OT LONG TERM GOAL #1   Title Pt's  L breast measurement decrease by 2 cm from top to bottom and report less heaviness   Status Achieved     OT LONG TERM GOAL #2   Title Pain , firmness, tightness decrease to 1/10 - to reach over head, pick up 28 month old grand daughter , start working out again    Baseline only did cardio this far - pt to increase workout the next month    Time 4   Period Weeks   Status On-going               Plan - 05/29/16 0855    Clinical Impression Statement Pt showed great progress in L breast fibrosis - and heaviness, hardness , and size decrease to same than R - except still area of tenderness and fibrosis 2 x2inch under scar on upper outside corner of L breast - pt  responded great to manual tech - and pt to cont at home with it and use of Jovipak under bra as needed - and  return gradually to workout - will follow up with  me in month    Rehab Potential Good   OT Frequency Monthly   OT Duration 4 weeks   OT Treatment/Interventions Self-care/ADL training;Manual lymph drainage;Scar mobilization;Patient/family education   Plan Reassess in month    OT Home Exercise Plan see pt instruction    Consulted and Agree with Plan of Care Patient      Patient will benefit from skilled therapeutic intervention in order to improve the following deficits and impairments:  Decreased skin integrity, Pain, Increased edema  Visit Diagnosis: Localized edema    Problem List Patient Active Problem List   Diagnosis Date Noted  . Breast cancer of upper-outer quadrant of left female breast ([place]) 10/24/2015  . Genetic testing 10/24/2015  . Family history of breast cancer in first degree relative 10/04/2015  . Family history of colon cancer 10/04/2015  . Chest pain 09/20/2014  . Acid reflux 09/20/2014  . Obesity 09/20/2014  . Menopausal symptoms 01/16/2013  . Pilonidal cyst 07/09/2012  . Routine general medical examination at a health care facility 02/04/2011  . FIBROCYSTIC BREAST DISEASE 10/20/2007  . PANIC DISORDER 10/17/2007    Rosalyn Gess OTR/L,CLT 05/29/2016, 9:01 AM  [facility] [address], [place], [address] Phone: [phone]   Fax:  [phone]  Name: Jamie Shaffer MRN: [id number] Date of Birth: Oct 19, 1960

## 2016-06-01 ENCOUNTER — Encounter: Payer: 59 | Admitting: Gastroenterology

## 2016-07-09 NOTE — Progress Notes (Signed)
Pt called to notify nurse that she is having some mild spotting in her underwear. Pt on arimidex since sept. Pt had an episode of spotting a week ago and today but usually goes away. Wanted to call and make sure that this is not of any concern. Pt states that she has vaginal dryness and had intercourse recently without using lubricants. Told pt that she will benefit better with lubrication during intercourse and that spotting is not a concern at this time, as long as it goes away. Told pt to call back if bleeding starts to occur and if pain, foul odor, or experience other symptoms with bleeding. Pt verbalized understanding and will keep a close eye on symptoms. Pt states that she will also reach out to her gynecologist to make sure everything is ok. She recently had a pap smear as well.

## 2016-07-12 ENCOUNTER — Encounter: Payer: Self-pay | Admitting: *Deleted

## 2016-07-24 ENCOUNTER — Encounter: Payer: Self-pay | Admitting: Gastroenterology

## 2016-07-24 ENCOUNTER — Telehealth: Payer: Self-pay

## 2016-07-24 ENCOUNTER — Ambulatory Visit (AMBULATORY_SURGERY_CENTER): Payer: Self-pay

## 2016-07-24 VITALS — Ht 63.0 in | Wt 179.0 lb

## 2016-07-24 DIAGNOSIS — Z800 Family history of malignant neoplasm of digestive organs: Secondary | ICD-10-CM

## 2016-07-24 MED ORDER — NA SULFATE-K SULFATE-MG SULF 17.5-3.13-1.6 GM/177ML PO SOLN: ORAL | 0 refills | Status: DC

## 2016-07-24 NOTE — Telephone Encounter (Signed)
Direct colonoscopy is OK, thanks

## 2016-07-24 NOTE — Telephone Encounter (Signed)
Direct colon ok per Dr Ardis Hughs. Will proceed as scheduled.

## 2016-07-24 NOTE — Telephone Encounter (Signed)
Dr Ardis Hughs, This pt is scheduled for a colon with you on 08/07/16. She is here in the office now for her PV. She has a history of left breast cancer in April 2017. She had a lumpectomy in May 2017. She did not have chemotherapy but her last radiation was August 18,2017. Does pt need an OV or is a direct colon ok. Please advise. Thanks

## 2016-07-24 NOTE — Progress Notes (Signed)
Per pt, no allergies to soy or egg products.Pt not taking any weight loss meds or using  O2 at home. 

## 2016-08-03 ENCOUNTER — Telehealth: Payer: Self-pay | Admitting: Gastroenterology

## 2016-08-03 NOTE — Telephone Encounter (Signed)
Pt calling back regarding pre being too expensive. Pt's proc is on Tuesday.

## 2016-08-03 NOTE — Telephone Encounter (Signed)
Pt aware that a voucher was called into the pharmacy and she will pay $50 for the prep.  She agreed and will pick up tomorrow.

## 2016-08-07 ENCOUNTER — Encounter: Payer: Self-pay | Admitting: Gastroenterology

## 2016-08-07 ENCOUNTER — Ambulatory Visit (AMBULATORY_SURGERY_CENTER): Payer: 59 | Admitting: Gastroenterology

## 2016-08-07 VITALS — BP 139/74 | HR 63 | Temp 97.1°F | Resp 13 | Ht 63.0 in | Wt 179.0 lb

## 2016-08-07 DIAGNOSIS — Z1211 Encounter for screening for malignant neoplasm of colon: Secondary | ICD-10-CM

## 2016-08-07 DIAGNOSIS — Z800 Family history of malignant neoplasm of digestive organs: Secondary | ICD-10-CM | POA: Diagnosis not present

## 2016-08-07 DIAGNOSIS — Z1212 Encounter for screening for malignant neoplasm of rectum: Secondary | ICD-10-CM

## 2016-08-07 MED ORDER — SODIUM CHLORIDE 0.9 % IV SOLN: 500.0000 mL | INTRAVENOUS | Status: DC

## 2016-08-07 NOTE — Op Note (Signed)
[facility] Patient Name: Jamie Shaffer Procedure Date: 08/07/2016 7:54 AM MRN: [id number] Endoscopist: Milus Banister , MD Age: 56 Referring MD:  Date of Birth: 04-01-1961 Gender: Female Account #: [id number] Procedure:                Colonoscopy Indications:              Colon cancer screening in patient at increased                            risk: Family history of colorectal cancer in                            multiple 2nd degree relatives (three second degree                            relatives on father's side with colon cancer) Medicines:                Monitored Anesthesia Care Procedure:                Pre-Anesthesia Assessment:                           - Prior to the procedure, a History and Physical                            was performed, and patient medications and                            allergies were reviewed. The patient's tolerance of                            previous anesthesia was also reviewed. The risks                            and benefits of the procedure and the sedation                            options and risks were discussed with the patient.                            All questions were answered, and informed consent                            was obtained. Prior Anticoagulants: The patient has                            taken no previous anticoagulant or antiplatelet                            agents. ASA Grade Assessment: II - A patient with                            mild systemic disease. After reviewing the risks  and benefits, the patient was deemed in                            satisfactory condition to undergo the procedure.                           After obtaining informed consent, the colonoscope                            was passed under direct vision. Throughout the                            procedure, the patient's blood pressure, pulse, and                            oxygen saturations  were monitored continuously. The                            Model CF-HQ190L [id number]) scope was introduced                            through the anus and advanced to the the cecum,                            identified by appendiceal orifice and ileocecal                            valve. The colonoscopy was performed without                            difficulty. The patient tolerated the procedure                            well. The quality of the bowel preparation was                            excellent. The ileocecal valve, appendiceal                            orifice, and rectum were photographed. Scope In: 8:02:03 AM Scope Out: 8:12:40 AM Scope Withdrawal Time: 0 hours 8 minutes 7 seconds  Total Procedure Duration: 0 hours 10 minutes 37 seconds  Findings:                 The entire examined colon appeared normal on direct                            and retroflexion views. Complications:            No immediate complications. Estimated blood loss:                            None. Estimated Blood Loss:     Estimated blood loss: none. Impression:               - The entire examined  colon is normal on direct and                            retroflexion views.                           - No polyps or cancers. Recommendation:           - Patient has a contact number available for                            emergencies. The signs and symptoms of potential                            delayed complications were discussed with the                            patient. Return to normal activities tomorrow.                            Written discharge instructions were provided to the                            patient.                           - Resume previous diet.                           - Continue present medications.                           - Repeat colonoscopy in 5 years for screening                            purposes. Milus Banister, MD 08/07/2016 8:15:05 AM This report  has been signed electronically.

## 2016-08-07 NOTE — Progress Notes (Signed)
A/ox3 pleased with MAC, report to Sheila RN 

## 2016-08-07 NOTE — Progress Notes (Signed)
Pt's states no medical or surgical changes since previsit or office visit. 

## 2016-08-07 NOTE — Patient Instructions (Signed)
YOU HAD AN ENDOSCOPIC PROCEDURE TODAY AT THE [facility]:   Refer to the procedure report that was given to you for any specific questions about what was found during the examination.  If the procedure report does not answer your questions, please call your gastroenterologist to clarify.  If you requested that your care partner not be given the details of your procedure findings, then the procedure report has been included in a sealed envelope for you to review at your convenience later.  YOU SHOULD EXPECT: Some feelings of bloating in the abdomen. Passage of more gas than usual.  Walking can help get rid of the air that was put into your GI tract during the procedure and reduce the bloating. If you had a lower endoscopy (such as a colonoscopy or flexible sigmoidoscopy) you may notice spotting of blood in your stool or on the toilet paper. If you underwent a bowel prep for your procedure, you may not have a normal bowel movement for a few days.  Please Note:  You might notice some irritation and congestion in your nose or some drainage.  This is from the oxygen used during your procedure.  There is no need for concern and it should clear up in a day or so.  SYMPTOMS TO REPORT IMMEDIATELY:   Following lower endoscopy (colonoscopy or flexible sigmoidoscopy):  Excessive amounts of blood in the stool  Significant tenderness or worsening of abdominal pains  Swelling of the abdomen that is new, acute  Fever of 100F or higher   For urgent or emergent issues, a gastroenterologist can be reached at any hour by calling [phone].   DIET:  We do recommend a small meal at first, but then you may proceed to your regular diet.  Drink plenty of fluids but you should avoid alcoholic beverages for 24 hours.  ACTIVITY:  You should plan to take it easy for the rest of today and you should NOT DRIVE or use heavy machinery until tomorrow (because of the sedation medicines used during the test).     FOLLOW UP: Our staff will call the number listed on your records the next business day following your procedure to check on you and address any questions or concerns that you may have regarding the information given to you following your procedure. If we do not reach you, we will leave a message.  However, if you are feeling well and you are not experiencing any problems, there is no need to return our call.  We will assume that you have returned to your regular daily activities without incident.  If any biopsies were taken you will be contacted by phone or by letter within the next 1-3 weeks.  Please call us at [phone] if you have not heard about the biopsies in 3 weeks.    SIGNATURES/CONFIDENTIALITY: You and/or your care partner have signed paperwork which will be entered into your electronic medical record.  These signatures attest to the fact that that the information above on your After Visit Summary has been reviewed and is understood.  Full responsibility of the confidentiality of this discharge information lies with you and/or your care-partner.   Resume medications.  

## 2016-08-08 ENCOUNTER — Telehealth: Payer: Self-pay | Admitting: *Deleted

## 2016-08-08 NOTE — Telephone Encounter (Signed)
  Follow up Call-  Call back number 08/07/2016  Post procedure Call Back phone  # [phone]  Permission to leave phone message Yes  Some recent data might be hidden     Patient questions:  Do you have a fever, pain , or abdominal swelling? No. Pain Score  0 *  Have you tolerated food without any problems? Yes.    Have you been able to return to your normal activities? Yes.    Do you have any questions about your discharge instructions: Diet   No. Medications  No. Follow up visit  No.  Do you have questions or concerns about your Care? No.  Actions: * If pain score is 4 or above: No action needed, pain <4.pt did ask about her iv site rt hand.Pt says iv site rt hand is swollen, a little bruised and sore .Denies redness or site being hot or signs of infection.encouraged pt to use ice pack for swelling and to call us back with any signs of infection or if doesn't improve.

## 2016-08-24 ENCOUNTER — Telehealth: Payer: Self-pay | Admitting: *Deleted

## 2016-08-24 ENCOUNTER — Telehealth: Payer: Self-pay

## 2016-08-24 NOTE — Telephone Encounter (Signed)
Called pt back to return her voicemail regarding concerns of aches and pain on top of her knee. Pt states that "sometimes, it feels like pins/needles when I exercise or walk. I have been experiencing this for the past 2 months and thinking its the arimidex causing it." Pt denies any other symptoms of DVT. No swelling, no redness, no sob, fever, cp. Pt states that she had taken notice to the numbness and tingling just for the past few days when she is exercising. Told pt to stop arimidex for 1-2 weeks and see if her symptoms improve. Advised that pt call in 1 week to update of her symptoms. Pt verbalized understanding and will stop arimidex starting today.

## 2016-08-24 NOTE — Telephone Encounter (Signed)
Patient called stating that she has noticed a month ago that an area on her right thigh going up to her hip feels numb and "pens/needle" like. She states she started armidex last year in September.

## 2016-10-01 ENCOUNTER — Other Ambulatory Visit: Payer: Self-pay | Admitting: Hematology and Oncology

## 2016-10-01 DIAGNOSIS — Z853 Personal history of malignant neoplasm of breast: Secondary | ICD-10-CM

## 2016-10-10 ENCOUNTER — Ambulatory Visit
Admission: RE | Admit: 2016-10-10 | Discharge: 2016-10-10 | Disposition: A | Discharge status: Home / Self Care | Payer: 59 | Source: Ambulatory Visit | Attending: Hematology and Oncology | Admitting: Hematology and Oncology

## 2016-10-10 DIAGNOSIS — Z853 Personal history of malignant neoplasm of breast: Secondary | ICD-10-CM

## 2016-10-10 HISTORY — DX: Personal history of irradiation: Z92.3

## 2016-11-09 ENCOUNTER — Ambulatory Visit: Payer: 59 | Admitting: Radiation Oncology

## 2016-11-20 ENCOUNTER — Encounter: Payer: Self-pay | Admitting: Hematology and Oncology

## 2016-11-20 ENCOUNTER — Ambulatory Visit (HOSPITAL_BASED_OUTPATIENT_CLINIC_OR_DEPARTMENT_OTHER): Payer: 59 | Admitting: Hematology and Oncology

## 2016-11-20 DIAGNOSIS — C50412 Malignant neoplasm of upper-outer quadrant of left female breast: Secondary | ICD-10-CM | POA: Diagnosis not present

## 2016-11-20 DIAGNOSIS — Z170 Estrogen receptor positive status [ER+]: Secondary | ICD-10-CM

## 2016-11-20 DIAGNOSIS — Z79811 Long term (current) use of aromatase inhibitors: Secondary | ICD-10-CM

## 2016-11-20 DIAGNOSIS — R61 Generalized hyperhidrosis: Secondary | ICD-10-CM | POA: Diagnosis not present

## 2016-11-20 DIAGNOSIS — N951 Menopausal and female climacteric states: Secondary | ICD-10-CM

## 2016-11-20 DIAGNOSIS — R634 Abnormal weight loss: Secondary | ICD-10-CM | POA: Diagnosis not present

## 2016-11-20 MED ORDER — ANASTROZOLE 1 MG PO TABS: 1.0000 mg | ORAL_TABLET | Freq: Every day | ORAL | 3 refills | Status: DC

## 2016-11-20 NOTE — Progress Notes (Signed)
Patient Care Team: Tower, [name], MD as PCP - General  DIAGNOSIS:  Encounter Diagnosis  Name Primary?  . Malignant neoplasm of upper-outer quadrant of left breast in female, estrogen receptor positive ([place])     SUMMARY OF ONCOLOGIC HISTORY:   Breast cancer of upper-outer quadrant of left female breast ([place])   09/28/2015 Initial Diagnosis    Left breast biopsy 2:00 position: IDC with DCIS, grade 1, ER 100%, PR 20%, Ki-67 10%, HER-2 negative ratio 1.32, MRI revealed 7 x 5 x 10 mm in the posterior third left breast, T1 cN0 stage IA clinical stage      10/04/2015 Genetic Testing    There were no deleterious mutations or a variants of uncertain significance (VUSes) identified.  Genes tested: APC, ATM, AXIN2, BARD1, BMPR1A, BRCA1, BRCA2, BRIP1, CDH1, CDK4, CDKN2A, CHEK2, EPCAM, FANCC, MLH1, MSH2 (with MSH2 Exons 1-7 Inversion Analysis), MSH6, MUTYH, NBN, PALB2, PMS2, POLD1, POLE, PTEN, RAD51C, RAD51D, SCG5/GREM1, SMAD4, STK11, TP53, VHL, and XRCC2.      11/02/2015 Surgery    Left lumpectomy ([place]): IDC with DCIS, 1 cm, margins negative, 1/3 lymph nodes positive, ER 100%, PR 20%, Ki-67 10%, HER-2 negative ratio 1.32, T1bN1 stage IIA, Mammaprint luminal-type A, low risk      12/07/2015 - 02/03/2016 Radiation Therapy    Radiation ([place]): Left breast/left supraclav: 50.4Gy, 28 fractions, left axilla: 12.32 Gy, 7 fractions, left breast boost: 14.4 Gy in 8 fractions.        02/07/2016 -  Anti-estrogen oral therapy    Anastrozole 72m daily, goal 5 years of therapy       CHIEF COMPLIANT: Follow-up on anastrozole therapy  INTERVAL HISTORY: Jamie BELLEAUis a 56year old with above-mentioned history left breast cancer treated with lumpectomy and radiation is currently on anastrozole therapy. She was low risk Mammaprint and did not need chemotherapy. She complains of soreness in the left breast related to a seroma. She is concerned about her weight gain issues. She tells me that every  day after evening hours she eats a lot of junk food. She is still exercises but her eating habits have not been good. Anastrozole occasionally calls hot flashes and night sweats.  REVIEW OF SYSTEMS:   Constitutional: Denies fevers, chills or abnormal weight loss Eyes: Denies blurriness of vision Ears, nose, mouth, throat, and face: Denies mucositis or sore throat Respiratory: Denies cough, dyspnea or wheezes Cardiovascular: Denies palpitation, chest discomfort Gastrointestinal:  Denies nausea, heartburn or change in bowel habits Skin: Denies abnormal skin rashes Lymphatics: Denies new lymphadenopathy or easy bruising Neurological:Denies numbness, tingling or new weaknesses Behavioral/Psych: Mood is stable, no new changes  Extremities: No lower extremity edema Breast: Left breast seroma causing intermittent tenderness All other systems were reviewed with the patient and are negative.  I have reviewed the past medical history, past surgical history, social history and family history with the patient and they are unchanged from previous note.  ALLERGIES:  has No Known Allergies.  MEDICATIONS:  Current Outpatient Prescriptions  Medication Sig Dispense Refill  . ALPRAZolam (XANAX) 0.5 MG tablet TAKE 1 TABLET BY MOUTH DAILY AS NEEDED FOR ANXIETY WHILE TRAVELLING 20 tablet 0  . anastrozole (ARIMIDEX) 1 MG tablet Take 1 tablet (1 mg total) by mouth daily. 90 tablet 3  . Calcium Carbonate-Vitamin D (CALTRATE 600+D PO) Take by mouth daily.    . Multiple Vitamin (MULTIVITAMIN) tablet Take 1 tablet by mouth daily.      . silver sulfADIAZINE (SILVADENE) 1 % cream Apply 1  application topically 2 (two) times daily. Apply to affected area (Patient not taking: Reported on 05/01/2016) 400 g 0   Current Facility-Administered Medications  Medication Dose Route Frequency Provider Last Rate Last Dose  . 0.9 %  sodium chloride infusion  500 mL Intravenous Continuous Milus Banister, MD        PHYSICAL  EXAMINATION: ECOG PERFORMANCE STATUS: 1 - Symptomatic but completely ambulatory  Vitals:   11/20/16 0841  BP: 130/76  Pulse: 66  Resp: 18  Temp: 97.7 F (36.5 C)   Filed Weights   11/20/16 0841  Weight: 179 lb 4.8 oz (81.3 kg)    GENERAL:alert, no distress and comfortable SKIN: skin color, texture, turgor are normal, no rashes or significant lesions EYES: normal, Conjunctiva are pink and non-injected, sclera clear OROPHARYNX:no exudate, no erythema and lips, buccal mucosa, and tongue normal  NECK: supple, thyroid normal size, non-tender, without nodularity LYMPH:  no palpable lymphadenopathy in the cervical, axillary or inguinal LUNGS: clear to auscultation and percussion with normal breathing effort HEART: regular rate & rhythm and no murmurs and no lower extremity edema ABDOMEN:abdomen soft, non-tender and normal bowel sounds MUSCULOSKELETAL:no cyanosis of digits and no clubbing  NEURO: alert & oriented x 3 with fluent speech, no focal motor/sensory deficits EXTREMITIES: No lower extremity edema  LABORATORY DATA:  I have reviewed the data as listed   Chemistry      Component Value Date/Time   NA 141 01/04/2016 0811   K 4.1 01/04/2016 0811   CL 106 01/04/2016 0811   CO2 30 01/04/2016 0811   BUN 13 01/04/2016 0811   CREATININE 0.68 01/04/2016 0811   CREATININE 0.72 01/16/2013 1639      Component Value Date/Time   CALCIUM 9.2 01/04/2016 0811   ALKPHOS 112 01/04/2016 0811   AST 40 (H) 01/04/2016 0811   ALT 41 (H) 01/04/2016 0811   BILITOT 0.5 01/04/2016 0811       Lab Results  Component Value Date   WBC 2.6 (L) 01/19/2016   HGB 11.8 (L) 01/19/2016   HCT 35.6 01/19/2016   MCV 83.1 01/19/2016   PLT 229 01/19/2016   NEUTROABS 1.9 01/04/2016    ASSESSMENT & PLAN:  Breast cancer of upper-outer quadrant of left female breast ([facility]) Left lumpectomy: IDC with DCIS, 1 cm, margins negative, 1/3 lymph nodes positive, ER 100%, PR 20%, Ki-67 10%, HER-2 negative ratio  1.32, T1bN1 stage II A Mammaprint: Luminal type A, low risk, did not need chemotherapy. Adjuvant radiation therapy from 11/28/2015 to08/18/2017  Recommendation: Adjuvant antiestrogen therapy With anastrozole 1 mg daily started 02/07/16 Anastrozole Toxicities: 1. Intermittent night sweats  2. occasional hot flashes  3.complaining of weight gain   Surveillance: 1. Breast exam 11/20/2016: Benign 2. Mammogram 10/10/2016: Postsurgical changes no mammographic evidence of malignancy    Weight gain issues: I discussed with her extensively about diet as a very critical component of keeping a healthy weight. She will try to eliminate all carbohydrate intake after 7 PM and will continue to exercise regularly. We discussed about other weight loss measures including oral therapies. I do not believe that oral weight loss medications are beneficial over the long run. She will try these measures and will call me if she is unsuccessful.  RTC in 1 yr  I spent 15 minutes talking to the patient of which more than half was spent in counseling and coordination of care.  No orders of the defined types were placed in this encounter.  The patient has a  good understanding of the overall plan. she agrees with it. she will call with any problems that may develop before the next visit here.   Rulon Eisenmenger, MD 11/20/16

## 2016-11-20 NOTE — Assessment & Plan Note (Signed)
Left lumpectomy: IDC with DCIS, 1 cm, margins negative, 1/3 lymph nodes positive, ER 100%, PR 20%, Ki-67 10%, HER-2 negative ratio 1.32, T1bN1 stage II A Mammaprint: Luminal type A, low risk, did not need chemotherapy. Adjuvant radiation therapy from 11/28/2015 to08/18/2017  Recommendation: Adjuvant antiestrogen therapy With anastrozole 1 mg daily started 02/07/16 Anastrozole Toxicities: 1. Intermittent night sweats  2. occasional hot flashes  3.complaining of weight gain   Surveillance: 1. Breast exam 11/20/2016: Benign 2. Mammogram 10/10/2016: Postsurgical changes no mammographic evidence of malignancy    RTC in 1 yr

## 2016-12-14 ENCOUNTER — Ambulatory Visit
Admission: RE | Admit: 2016-12-14 | Discharge: 2016-12-14 | Disposition: A | Discharge status: Home / Self Care | Payer: 59 | Source: Ambulatory Visit | Attending: Radiation Oncology | Admitting: Radiation Oncology

## 2016-12-14 ENCOUNTER — Encounter: Payer: Self-pay | Admitting: Radiation Oncology

## 2016-12-14 VITALS — BP 128/78 | HR 75 | Temp 97.8°F | Ht 63.0 in | Wt 181.5 lb

## 2016-12-14 DIAGNOSIS — Z170 Estrogen receptor positive status [ER+]: Principal | ICD-10-CM

## 2016-12-14 DIAGNOSIS — C50412 Malignant neoplasm of upper-outer quadrant of left female breast: Secondary | ICD-10-CM

## 2016-12-14 NOTE — Progress Notes (Signed)
Patient here for 1 year follow up. No changes since last appointment.

## 2016-12-14 NOTE — Progress Notes (Signed)
Radiation Oncology Follow up Note  Name: Jamie Shaffer   Date:   12/14/2016 MRN:  [id number] DOB: 21-Feb-1961    This 56 y.o. female presents to the clinic today for 1 year follow-up status post whole breast radiation to her left breast for stage IIa invasive mammary carcinoma.  REFERRING PROVIDER: Tower, [name], MD  HPI: Patient is a 56 year old female now out 1 year having completed radiation therapy to her left breast for stage IIa invasive mammary carcinoma status post wide local excision and sentinel node biopsy. Tumor was ER/PR positive HER-2/neu negative. She seen today in routine follow-up and is doing well. She does have a area of calcified blood in her seroma cavity not causing any problems just noticeable to the patient. Otherwise specifically denies breast tenderness cough or bone pain. Her. Last mammograms were in April 2018 were BI-RADS 2 benign which I have reviewed. She is currently on arimadex tolerate that well without side effect  COMPLICATIONS OF TREATMENT: none  FOLLOW UP COMPLIANCE: keeps appointments   PHYSICAL EXAM:  BP 128/78 (BP Location: Right Arm, Patient Position: Sitting, Cuff Size: Normal)   Pulse 75   Temp 97.8 F (36.6 C) (Tympanic)   Ht 5' 3" (1.6 m)   Wt 181 lb 8.8 oz (82.3 kg)   BMI 32.16 kg/m  Lungs are clear to A&P cardiac examination essentially unremarkable with regular rate and rhythm. No dominant mass or nodularity is noted in either breast in 2 positions examined. Incision is well-healed. No axillary or supraclavicular adenopathy is appreciated. Cosmetic result is excellent. She does have a's firm smooth mobile mass in her seroma consistent with blood in the seroma cavity which is calcified. Well-developed well-nourished patient in NAD. HEENT reveals PERLA, EOMI, discs not visualized.  Oral cavity is clear. No oral mucosal lesions are identified. Neck is clear without evidence of cervical or supraclavicular adenopathy. Lungs are clear to A&P.  Cardiac examination is essentially unremarkable with regular rate and rhythm without murmur rub or thrill. Abdomen is benign with no organomegaly or masses noted. Motor sensory and DTR levels are equal and symmetric in the upper and lower extremities. Cranial nerves II through XII are grossly intact. Proprioception is intact. No peripheral adenopathy or edema is identified. No motor or sensory levels are noted. Crude visual fields are within normal range.  RADIOLOGY RESULTS: Mammograms are reviewed and compatible with the above-stated findings  PLAN: The present time patient is doing well with no evidence of disease. I've asked to see her back in 1 year for follow-up. She continues on arimadex without side effect. She is a rescheduled for follow-up mammograms. Patient is to call with any concerns.  I would like to take this opportunity to thank you for allowing me to participate in the care of your patient.Armstead Peaks., MD

## 2017-01-03 ENCOUNTER — Telehealth: Payer: Self-pay | Admitting: Family Medicine

## 2017-01-03 DIAGNOSIS — Z0000 Encounter for general adult medical examination without abnormal findings: Secondary | ICD-10-CM

## 2017-01-03 NOTE — Telephone Encounter (Signed)
-----   Message from Marchia Bond sent at 12/31/2016  2:53 PM EDT ----- Regarding: Cpx labs Fri 7/20, need orders. Thanks :-) Please order  future cpx labs for pt's upcoming lab appt. Thanks Aniceto Boss

## 2017-01-04 ENCOUNTER — Other Ambulatory Visit (INDEPENDENT_AMBULATORY_CARE_PROVIDER_SITE_OTHER): Payer: 59

## 2017-01-04 DIAGNOSIS — Z0000 Encounter for general adult medical examination without abnormal findings: Secondary | ICD-10-CM

## 2017-01-04 LAB — COMPREHENSIVE METABOLIC PANEL
ALBUMIN: 3.8 g/dL (ref 3.5–5.2)
ALK PHOS: 62 U/L (ref 39–117)
ALT: 16 U/L (ref 0–35)
AST: 18 U/L (ref 0–37)
BUN: 15 mg/dL (ref 6–23)
CHLORIDE: 106 meq/L (ref 96–112)
CO2: 30 mEq/L (ref 19–32)
Calcium: 9.6 mg/dL (ref 8.4–10.5)
Creatinine, Ser: 0.74 mg/dL (ref 0.40–1.20)
GFR: 103.76 mL/min (ref >60.00)
Glucose, Bld: 94 mg/dL (ref 70–99)
POTASSIUM: 4 meq/L (ref 3.5–5.1)
SODIUM: 140 meq/L (ref 135–145)
TOTAL PROTEIN: 6.9 g/dL (ref 6.0–8.3)
Total Bilirubin: 0.5 mg/dL (ref 0.2–1.2)

## 2017-01-04 LAB — LIPID PANEL
CHOLESTEROL: 164 mg/dL (ref 0–200)
HDL: 65.6 mg/dL (ref >39.00)
LDL CALC: 83 mg/dL (ref 0–99)
NonHDL: 98.77
Total CHOL/HDL Ratio: 3
Triglycerides: 77 mg/dL (ref 0.0–149.0)
VLDL: 15.4 mg/dL (ref 0.0–40.0)

## 2017-01-04 LAB — CBC WITH DIFFERENTIAL/PLATELET
BASOS ABS: 0 10*3/uL (ref 0.0–0.1)
Basophils Relative: 0.7 % (ref 0.0–3.0)
EOS PCT: 2.3 % (ref 0.0–5.0)
Eosinophils Absolute: 0.1 10*3/uL (ref 0.0–0.7)
HCT: 36.7 % (ref 36.0–46.0)
HEMOGLOBIN: 11.7 g/dL — AB (ref 12.0–15.0)
Lymphocytes Relative: 38.1 % (ref 12.0–46.0)
Lymphs Abs: 1.2 10*3/uL (ref 0.7–4.0)
MCHC: 31.8 g/dL (ref 30.0–36.0)
MCV: 85.2 fl (ref 78.0–100.0)
MONO ABS: 0.3 10*3/uL (ref 0.1–1.0)
MONOS PCT: 10.3 % (ref 3.0–12.0)
Neutro Abs: 1.6 10*3/uL (ref 1.4–7.7)
Neutrophils Relative %: 48.6 % (ref 43.0–77.0)
Platelets: 261 10*3/uL (ref 150.0–400.0)
RBC: 4.3 Mil/uL (ref 3.87–5.11)
RDW: 14.4 % (ref 11.5–15.5)
WBC: 3.3 10*3/uL — AB (ref 4.0–10.5)

## 2017-01-04 LAB — TSH: TSH: 1.23 u[IU]/mL (ref 0.35–4.50)

## 2017-01-11 ENCOUNTER — Encounter: Payer: 59 | Admitting: Family Medicine

## 2017-01-14 ENCOUNTER — Ambulatory Visit (INDEPENDENT_AMBULATORY_CARE_PROVIDER_SITE_OTHER): Payer: 59 | Admitting: Family Medicine

## 2017-01-14 ENCOUNTER — Encounter: Payer: Self-pay | Admitting: Family Medicine

## 2017-01-14 VITALS — BP 108/72 | HR 66 | Temp 98.5°F | Ht 62.75 in | Wt 176.5 lb

## 2017-01-14 DIAGNOSIS — Z0000 Encounter for general adult medical examination without abnormal findings: Secondary | ICD-10-CM

## 2017-01-14 DIAGNOSIS — Z170 Estrogen receptor positive status [ER+]: Secondary | ICD-10-CM | POA: Diagnosis not present

## 2017-01-14 DIAGNOSIS — C50412 Malignant neoplasm of upper-outer quadrant of left female breast: Secondary | ICD-10-CM | POA: Diagnosis not present

## 2017-01-14 DIAGNOSIS — E6609 Other obesity due to excess calories: Secondary | ICD-10-CM | POA: Diagnosis not present

## 2017-01-14 DIAGNOSIS — Z6831 Body mass index (BMI) 31.0-31.9, adult: Secondary | ICD-10-CM | POA: Diagnosis not present

## 2017-01-14 MED ORDER — ALPRAZOLAM 0.5 MG PO TABS: ORAL_TABLET | ORAL | 0 refills | Status: DC

## 2017-01-14 NOTE — Patient Instructions (Addendum)
Increase exercise to 5 days per week  Stay active on the other days  Eat healthier foods when you get hungry   Make sure to schedule your bone density test with gyn   Get your flu shot every fall

## 2017-01-14 NOTE — Assessment & Plan Note (Signed)
Reviewed health habits including diet and exercise and skin cancer prevention Reviewed appropriate screening tests for age  Also reviewed health mt list, fam hx and immunization status , as well as social and family history   See HPI Labs reviewed  She will get dexa with gyn at that visit Improved cholesterol Disc wt loss effort

## 2017-01-14 NOTE — Assessment & Plan Note (Signed)
Discussed how this problem influences overall health and the risks it imposes  Reviewed plan for weight loss with lower calorie diet (via better food choices and also portion control or program like weight watchers) and exercise building up to or more than 30 minutes 5 days per week including some aerobic activity    

## 2017-01-14 NOTE — Assessment & Plan Note (Signed)
Tolerating arimidex Continues oncol f/u  Mammogram 4/18 ok  Will get dexa with gyn

## 2017-01-14 NOTE — Progress Notes (Signed)
Subjective:    Patient ID: Jamie Shaffer, female    DOB: [birth year], 56 y.o.   MRN: [id number]  HPI Here for health maintenance exam and to review chronic medical problems    Has been doing well  On arimidex - increases her appetite  Trying to eat well  Exercise - 3-4 times per week - doing well for that  Active job   Wt Readings from Last 3 Encounters:  01/14/17 176 lb 8 oz (80.1 kg)  12/14/16 181 lb 8.8 oz (82.3 kg)  11/20/16 179 lb 4.8 oz (81.3 kg)   31.52 kg/m   Pap 8/16-neg Hx of partial hysterectomy She sees gyn in oct - will do a bone density at that time   Mammogram neg 4/18 s/p lumpectomy for breast cancer  Strong family hx of breast cancer  Now on arimidex   Colonoscopy 2/18 neg  5 y recall  fam hx of colon cancer   Tetanus shot 8/12 Flu shot 10/17  (she will get it in sept)   Labs:  Results for orders placed or performed in visit on 01/04/17  CBC with Differential/Platelet  Result Value Ref Range   WBC 3.3 (L) 4.0 - 10.5 K/uL   RBC 4.30 3.87 - 5.11 Mil/uL   Hemoglobin 11.7 (L) 12.0 - 15.0 g/dL   HCT 36.7 36.0 - 46.0 %   MCV 85.2 78.0 - 100.0 fl   MCHC 31.8 30.0 - 36.0 g/dL   RDW 14.4 11.5 - 15.5 %   Platelets 261.0 150.0 - 400.0 K/uL   Neutrophils Relative % 48.6 43.0 - 77.0 %   Lymphocytes Relative 38.1 12.0 - 46.0 %   Monocytes Relative 10.3 3.0 - 12.0 %   Eosinophils Relative 2.3 0.0 - 5.0 %   Basophils Relative 0.7 0.0 - 3.0 %   Neutro Abs 1.6 1.4 - 7.7 K/uL   Lymphs Abs 1.2 0.7 - 4.0 K/uL   Monocytes Absolute 0.3 0.1 - 1.0 K/uL   Eosinophils Absolute 0.1 0.0 - 0.7 K/uL   Basophils Absolute 0.0 0.0 - 0.1 K/uL  Lipid panel  Result Value Ref Range   Cholesterol 164 0 - 200 mg/dL   Triglycerides 77.0 0.0 - 149.0 mg/dL   HDL 65.60 >39.00 mg/dL   VLDL 15.4 0.0 - 40.0 mg/dL   LDL Cholesterol 83 0 - 99 mg/dL   Total CHOL/HDL Ratio 3    NonHDL 98.77   Comprehensive metabolic panel  Result Value Ref Range   Sodium 140 135 - 145 mEq/L    Potassium 4.0 3.5 - 5.1 mEq/L   Chloride 106 96 - 112 mEq/L   CO2 30 19 - 32 mEq/L   Glucose, Bld 94 70 - 99 mg/dL   BUN 15 6 - 23 mg/dL   Creatinine, Ser 0.74 0.40 - 1.20 mg/dL   Total Bilirubin 0.5 0.2 - 1.2 mg/dL   Alkaline Phosphatase 62 39 - 117 U/L   AST 18 0 - 37 U/L   ALT 16 0 - 35 U/L   Total Protein 6.9 6.0 - 8.3 g/dL   Albumin 3.8 3.5 - 5.2 g/dL   Calcium 9.6 8.4 - 10.5 mg/dL   GFR 103.76 >60.00 mL/min  TSH  Result Value Ref Range   TSH 1.23 0.35 - 4.50 uIU/mL    Mild anemia / stable   Cholesterol: Lab Results  Component Value Date   CHOL 164 01/04/2017   CHOL 182 01/04/2016   CHOL 203 (H) 09/03/2014   Lab  Results  Component Value Date   HDL 65.60 01/04/2017   HDL 72.20 01/04/2016   HDL 80.70 09/03/2014   Lab Results  Component Value Date   LDLCALC 83 01/04/2017   LDLCALC 100 (H) 01/04/2016   LDLCALC 111 (H) 09/03/2014   Lab Results  Component Value Date   TRIG 77.0 01/04/2017   TRIG 51.0 01/04/2016   TRIG 57.0 09/03/2014   Lab Results  Component Value Date   CHOLHDL 3 01/04/2017   CHOLHDL 3 01/04/2016   CHOLHDL 3 09/03/2014   Lab Results  Component Value Date   LDLDIRECT 93.0 02/26/2012   Very good cholesterol profile (LDL is better)  Eating more fruits and veg    (smoothies with almond milk)  Eats a lot of fish and chicken    Review of Systems     Objective:   Physical Exam  Constitutional: She appears well-developed and well-nourished. No distress.  obese and well appearing   HENT:  Head: Normocephalic and atraumatic.  Right Ear: External ear normal.  Left Ear: External ear normal.  Mouth/Throat: Oropharynx is clear and moist.  Eyes: Pupils are equal, round, and reactive to light. Conjunctivae and EOM are normal. No scleral icterus.  Neck: Normal range of motion. Neck supple. No JVD present. Carotid bruit is not present. No thyromegaly present.  Cardiovascular: Normal rate, regular rhythm, normal heart sounds and intact distal  pulses.  Exam reveals no gallop.   Pulmonary/Chest: Effort normal and breath sounds normal. No respiratory distress. She has no wheezes. She exhibits no tenderness.  Abdominal: Soft. Bowel sounds are normal. She exhibits no distension, no abdominal bruit and no mass. There is no tenderness.  Genitourinary: No breast swelling, tenderness, discharge or bleeding.  Genitourinary Comments: Breast exam: No mass, nodules, thickening, tenderness, bulging, retraction, inflamation, nipple discharge or skin changes noted.  No axillary or clavicular LA.    L breast: post surgical changes in axilla with scar tissue palpated (baseline)  Musculoskeletal: Normal range of motion. She exhibits no edema or tenderness.  Lymphadenopathy:    She has no cervical adenopathy.  Neurological: She is alert. She has normal reflexes. No cranial nerve deficit. She exhibits normal muscle tone. Coordination normal.  Skin: Skin is warm and dry. No rash noted. No erythema. No pallor.  Few skin tags on neck  Psychiatric: She has a normal mood and affect.          Assessment & Plan:   Problem List Items Addressed This Visit      Other   Breast cancer of upper-outer quadrant of left female breast ([place])    Tolerating arimidex Continues oncol f/u  Mammogram 4/18 ok  Will get dexa with gyn      Relevant Medications   ALPRAZolam (XANAX) 0.5 MG tablet   Obesity    Discussed how this problem influences overall health and the risks it imposes  Reviewed plan for weight loss with lower calorie diet (via better food choices and also portion control or program like weight watchers) and exercise building up to or more than 30 minutes 5 days per week including some aerobic activity         Routine general medical examination at a health care facility - Primary    Reviewed health habits including diet and exercise and skin cancer prevention Reviewed appropriate screening tests for age  Also reviewed health mt list, fam hx  and immunization status , as well as social and family history   See HPI Labs  reviewed  She will get dexa with gyn at that visit Improved cholesterol Disc wt loss effort

## 2017-01-22 ENCOUNTER — Encounter (HOSPITAL_COMMUNITY): Payer: Self-pay

## 2017-02-07 ENCOUNTER — Telehealth: Payer: Self-pay | Admitting: Family Medicine

## 2017-02-07 NOTE — Telephone Encounter (Signed)
PLEASE NOTE: All timestamps contained within this report are represented as [place] Standard Time. CONFIDENTIALTY NOTICE: This fax transmission is intended only for the addressee. It contains information that is legally privileged, confidential or otherwise protected from use or disclosure. If you are not the intended recipient, you are strictly prohibited from reviewing, disclosing, copying using or disseminating any of this information or taking any action in reliance on or regarding this information. If you have received this fax in error, please notify us immediately by telephone so that we can arrange for its return to [place]. Phone: [phone], Toll-Free: [phone], Fax: [phone] Page: 1 of 2 Call Id: [id number] [place] Patient Name: Jamie Shaffer Gender: Female DOB: 1960-11-15 Age: 56 Y 10 M 1 D Return Phone Number: [phone] (Primary), [phone] (Secondary) City/State/Zip: [place] [address] Client [facility] - Client Client Site [place] Physician Glori Bickers, Roque Lias - MD Who Is Calling Patient / Member / Family / Caregiver Call Type Triage / Clinical Relationship To Patient Self Return Phone Number [phone] (Primary) Chief Complaint Nasal Congestion Reason for Call Symptomatic / Request for Health Information Initial Comment Caller states c/o head and ear congestion, requests Rx. Appointment Disposition EMR Appointment Not Necessary Info pasted into Epic Yes Nurse Assessment Nurse: Harlow Mares, RN, Suanne Marker Date/Time (Eastern Time): 02/07/2017 1:45:38 PM Confirm and document reason for call. If symptomatic, describe symptoms. ---Caller states c/o head and ear congestion, requests Rx. Reports that she has been on a plane a couple of times recently which has made this worse. Symptoms began on 2 days ago. No fever. Does the PT have  any chronic conditions? (i.e. diabetes, asthma, etc.) ---Yes List chronic conditions. ---low estrogen level/. anxiety Guidelines Guideline Title Affirmed Question Common Cold Cold with no complications Disp. Time Eilene Ghazi Time) Disposition Final User 02/07/2017 1:50:17 PM Home Care Yes Harlow Mares, RN, [facility] Advice Given Per Guideline HOME CARE: You should be able to treat this at home. REASSURANCE AND EDUCATION: * It sounds like an uncomplicated cold that we can treat at home. * Colds are very common and may make you feel uncomfortable. * Colds are caused by viruses, and no medicine or 'shot' will cure an uncomplicated cold. * Colds are usually not serious. FOR A STUFFY NOSE - USE NASAL WASHES: * Introduction: Saline (salt water) nasal irrigation (nasal wash) is an effective and simple home remedy for treating stuffy nose and sinus congestion. The nose can be irrigated by pouring, spraying, or squirting salt water into the nose and then letting it run back out. * How it Helps: The salt water rinses out excess mucus, washes out any irritants (dust, allergens) that might be present, and moistens the nasal cavity. * Methods: There are several ways to perform nasal irrigation. You can use a saline nasal spray bottle (available over-the-counter), a rubber ear syringe, a medical syringe without the needle, or a NETI POT. FOR A RUNNY NOSE - BLOW YOUR NOSE: * Nasal mucus and discharge help wash viruses and bacteria out of the nose and sinuses. * Blowing your nose helps clean out your nose. Use a handkerchief or a paper tissue. * If the skin around your nostrils gets irritated, apply a tiny amount of petroleum ointment to the nasal openings once or twice a day. NASAL DECONGESTANTS FOR A VERY STUFFY NOSE: * MOST PEOPLE DO NOT NEED TO USE THESE MEDICINES. * If your  nose feels blocked, you should try using nasal washes first. * Oxymetazoline Nasal Drops (Afrin): Available over-the-counter. Clean out the  nose before using. Spray each nostril once, wait one minute for absorption, and then spray a second time. HUMIDIFIER: If the air in your home is dry, use a humidifier. PLEASE NOTE: All timestamps contained within this report are represented as [place] Standard Time. CONFIDENTIALTY NOTICE: This fax transmission is intended only for the addressee. It contains information that is legally privileged, confidential or otherwise protected from use or disclosure. If you are not the intended recipient, you are strictly prohibited from reviewing, disclosing, copying using or disseminating any of this information or taking any action in reliance on or regarding this information. If you have received this fax in error, please notify us immediately by telephone so that we can arrange for its return to [place]. Phone: [phone], Toll-Free: [phone], Fax: [phone] Page: 2 of 2 Call Id: [id number] Care Advice Given Per Guideline * Hydrate: Drink adequate liquids. TREATMENT FOR OTHER COLD SYMPTOMS: EXPECTED COURSE: * Fever 2-3 days * Nasal discharge 7-14 days * Cough 2-3 weeks. CALL BACK IF: * Fever lasts over 3 days * Runny nose lasts over 10 days * You become short of breath * You become worse. CARE ADVICE given per Colds (Adult) guideline.

## 2017-02-07 NOTE — Telephone Encounter (Signed)
[facility]  Patient Name: NYKIRA REDDIX  DOB: 06/25/1960    Initial Comment Caller states c/o head and ear congestion, requests Rx.   Nurse Assessment  Nurse: Harlow Mares, RN, Suanne Marker Date/Time (Eastern Time): 02/07/2017 1:45:38 PM  Confirm and document reason for call. If symptomatic, describe symptoms. ---Caller states c/o head and ear congestion, requests Rx. Reports that she has been on a plane a couple of times recently which has made this worse. Symptoms began on 2 days ago. No fever.  Does the patient have any new or worsening symptoms? ---Yes  Will a triage be completed? ---Yes  Related visit to physician within the last 2 weeks? ---No  Does the PT have any chronic conditions? (i.e. diabetes, asthma, etc.) ---Yes  List chronic conditions. ---low estrogen level/. anxiety  Is this a behavioral health or substance abuse call? ---No     Guidelines    Guideline Title Affirmed Question Affirmed Notes  Common Cold Cold with no complications    Final Disposition [place], RN, Suanne Marker    Disagree/Comply: Comply

## 2017-08-06 ENCOUNTER — Encounter: Payer: Self-pay | Admitting: *Deleted

## 2017-08-14 ENCOUNTER — Telehealth: Payer: Self-pay | Admitting: Hematology and Oncology

## 2017-08-14 NOTE — Telephone Encounter (Signed)
Faxed records to [facility] risk adjustment

## 2017-09-03 ENCOUNTER — Other Ambulatory Visit: Payer: Self-pay | Admitting: Hematology and Oncology

## 2017-09-03 DIAGNOSIS — Z853 Personal history of malignant neoplasm of breast: Secondary | ICD-10-CM

## 2017-09-19 ENCOUNTER — Ambulatory Visit: Payer: 59 | Admitting: Internal Medicine

## 2017-09-19 ENCOUNTER — Encounter: Payer: Self-pay | Admitting: Internal Medicine

## 2017-09-19 VITALS — BP 118/78 | HR 78 | Temp 99.4°F | Wt 179.0 lb

## 2017-09-19 DIAGNOSIS — R509 Fever, unspecified: Secondary | ICD-10-CM | POA: Diagnosis not present

## 2017-09-19 DIAGNOSIS — J029 Acute pharyngitis, unspecified: Secondary | ICD-10-CM | POA: Diagnosis not present

## 2017-09-19 LAB — POC INFLUENZA A&B (BINAX/QUICKVUE)
INFLUENZA A, POC: NEGATIVE
Influenza B, POC: NEGATIVE

## 2017-09-19 LAB — POCT RAPID STREP A (OFFICE): Rapid Strep A Screen: NEGATIVE

## 2017-09-19 MED ORDER — AMOXICILLIN 875 MG PO TABS: 875.0000 mg | ORAL_TABLET | Freq: Two times a day (BID) | ORAL | 0 refills | Status: DC

## 2017-09-19 NOTE — Patient Instructions (Signed)

## 2017-09-19 NOTE — Addendum Note (Signed)
Addended by: Lurlean Nanny on: 09/19/2017 03:36 PM   Modules accepted: Orders

## 2017-09-19 NOTE — Progress Notes (Signed)
HPI  Pt presents to the clinic today with c/o headache, fatigue, sore throat and nausea. She reports this started yesterday. She is having some difficulty swallowing. She denies vomiting, diarrhea, or blood in her stool. She has run fevers up 99.6, had chills and body aches. She has taken Ibuprofen with minimal relief. She has no history of allergies. She has had sick contacts.    Review of Systems      Past Medical History:  Diagnosis Date  . Cancer [facility]) 10-2015   left breast/last radiation tx08/17/ no chemo  . Fibrocystic breast   . GERD (gastroesophageal reflux disease)   . Panic disorder   . Personal history of radiation therapy   . Post-menopausal     Family History  Problem Relation Age of Onset  . Hypertension Mother   . Diabetes Mother   . Heart failure Mother 66  . Breast cancer Mother 41       +lump  . Breast cancer Sister        dx. 68s; s/p mastectomy  . Breast cancer Sister 23       s/p lump; negative genetic testing  . Fibroids Sister   . Heart Problems Father        valve replacement  . Kidney failure Father 45  . Prostate cancer Father        dx. later age; did not cause his death  . Breast cancer Sister 2       "cancerous cells"; s/p radiation  . Fibroids Sister   . Fibroids Sister   . Bladder Cancer Brother 43       smoker; had issues for 7-8 yrs before going to the doctor  . Colon cancer Maternal Aunt        dx. 27s  . Heart Problems Maternal Uncle   . Lung cancer Brother        s/p surgery to remove nodule?; not a smoker; dx. 61-62  . Alzheimer's disease Maternal Aunt        d. 66  . Colon cancer Maternal Uncle        dx. in 62s or younger  . Lung cancer Cousin        maternal 1st cousin dx. 14s; smoker  . Prostate cancer Cousin        maternal 1st cousin dx. early 47s  . Melanoma Cousin        maternal 1st cousin dx. 17s  . Breast cancer Cousin 54       maternal 1st cousin   . Kidney cancer Cousin        maternal 1st cousin dx. 12s  .  Bladder Cancer Paternal Uncle        dx. late 89s-80s  . Aneurysm Maternal Grandmother        brain; d. 44s  . Colon cancer Maternal Grandfather        d. 40s-50s  . Heart disease Paternal Grandmother        d. 57s  . Heart attack Paternal Grandmother   . Heart disease Paternal Grandfather        d. 46s  . Heart attack Paternal Grandfather   . Breast cancer Cousin        paternal 1st cousin dx. 52s  . Cancer Cousin        (x2) paternal 1st cousins d. unspecified cancers at unspecified ages    Social History   Socioeconomic History  . Marital status: Married    Spouse  name: Not on file  . Number of children: 2  . Years of education: Not on file  . Highest education level: Not on file  Occupational History  . Occupation: Environmental health practitioner: [employer]  Social Needs  . Financial resource strain: Not on file  . Food insecurity:    Worry: Not on file    Inability: Not on file  . Transportation needs:    Medical: Not on file    Non-medical: Not on file  Tobacco Use  . Smoking status: Never Smoker  . Smokeless tobacco: Never Used  . Tobacco comment: tried smoking as a teenager, but no continued use  Substance and Sexual Activity  . Alcohol use: No  . Drug use: No  . Sexual activity: Not on file  Lifestyle  . Physical activity:    Days per week: Not on file    Minutes per session: Not on file  . Stress: Not on file  Relationships  . Social connections:    Talks on phone: Not on file    Gets together: Not on file    Attends religious service: Not on file    Active member of club or organization: Not on file    Attends meetings of clubs or organizations: Not on file    Relationship status: Not on file  . Intimate partner violence:    Fear of current or ex partner: Not on file    Emotionally abused: Not on file    Physically abused: Not on file    Forced sexual activity: Not on file  Other Topics Concern  . Not on file   Social History Narrative   Exercise regularly at the gym    No Known Allergies   Constitutional: Positive headache, fatigue and fever. Denies abrupt weight changes.  HEENT:  Positive sore throat. Denies eye redness, eye pain, pressure behind the eyes, facial pain, nasal congestion, ear pain, ringing in the ears, wax buildup, runny nose or bloody nose. Respiratory: Denies cough, difficulty breathing or shortness of breath.  Cardiovascular: Denies chest pain, chest tightness, palpitations or swelling in the hands or feet.   No other specific complaints in a complete review of systems (except as listed in HPI above).  Objective:   BP 118/78   Pulse 78   Temp 99.4 F (37.4 C) (Oral)   Wt 179 lb (81.2 kg)   SpO2 98%   BMI 31.96 kg/m  Wt Readings from Last 3 Encounters:  09/19/17 179 lb (81.2 kg)  01/14/17 176 lb 8 oz (80.1 kg)  12/14/16 181 lb 8.8 oz (82.3 kg)     General: Appears her stated age, ill appearing, in NAD. HEENT: Head: normal shape and size, no sinus tenderness noted; Ears: Tm's gray and intact, normal light reflex; Nose: mucosa pink and moist, septum midline; Throat/Mouth: Teeth present, mucosa erythematous and moist, no exudate noted, no lesions or ulcerations noted.  Neck: Bilateral anterior cervical lymphadenopathy.  Pulmonary/Chest: Normal effort and positive vesicular breath sounds. No respiratory distress. No wheezes, rales or ronchi noted.       Assessment & Plan:   Sore Throat, Fever and Chills:  RST: negative Will send throat culture Rapid flu: negative Get some rest and drink plenty of water Do salt water gargles for the sore throat Will treat for strep using Cent[facility] criteria eRx for Amoxil BID x 10 days Ibuprofen as needed for pain and swelling   RTC as needed or if symptoms persist.  Webb Silversmith, NP

## 2017-09-21 LAB — CULTURE, GROUP A STREP
MICRO NUMBER: 90417871
SPECIMEN QUALITY:: ADEQUATE

## 2017-10-11 ENCOUNTER — Other Ambulatory Visit: Payer: Self-pay | Admitting: Hematology and Oncology

## 2017-10-11 ENCOUNTER — Ambulatory Visit
Admission: RE | Admit: 2017-10-11 | Discharge: 2017-10-11 | Disposition: A | Discharge status: Home / Self Care | Payer: 59 | Source: Ambulatory Visit | Attending: Hematology and Oncology | Admitting: Hematology and Oncology

## 2017-10-11 DIAGNOSIS — Z853 Personal history of malignant neoplasm of breast: Secondary | ICD-10-CM

## 2017-10-11 DIAGNOSIS — R921 Mammographic calcification found on diagnostic imaging of breast: Secondary | ICD-10-CM

## 2017-10-23 ENCOUNTER — Telehealth: Payer: Self-pay

## 2017-10-23 NOTE — Telephone Encounter (Signed)
Returned pt vm regarding questions about her mammogram results. Pt wanted to know what fat necrosis and calcification meant. Explained to pt that the fat tissue in normal breast can be affected by surgery or radiation treatments, and results in scar tissue to form and firming of the breast tissue, at times. This does not mean that it is malignant in any way. With her results, its completely benign and a follow up recommended MM in 6 months will need to be done to make sure there are no changes. Pt was very relieved and verbalized understanding. No further concerns at this time.

## 2017-11-20 ENCOUNTER — Inpatient Hospital Stay: Payer: 59 | Attending: Hematology and Oncology | Admitting: Hematology and Oncology

## 2017-11-20 DIAGNOSIS — Z79811 Long term (current) use of aromatase inhibitors: Secondary | ICD-10-CM | POA: Diagnosis not present

## 2017-11-20 DIAGNOSIS — Z923 Personal history of irradiation: Secondary | ICD-10-CM | POA: Diagnosis not present

## 2017-11-20 DIAGNOSIS — C50412 Malignant neoplasm of upper-outer quadrant of left female breast: Secondary | ICD-10-CM | POA: Diagnosis present

## 2017-11-20 DIAGNOSIS — Z170 Estrogen receptor positive status [ER+]: Secondary | ICD-10-CM | POA: Diagnosis not present

## 2017-11-20 MED ORDER — ANASTROZOLE 1 MG PO TABS: 1.0000 mg | ORAL_TABLET | Freq: Every day | ORAL | 3 refills | Status: DC

## 2017-11-20 NOTE — Assessment & Plan Note (Signed)
Left lumpectomy: IDC with DCIS, 1 cm, margins negative, 1/3 lymph nodes positive, ER 100%, PR 20%, Ki-67 10%, HER-2 negative ratio 1.32, T1bN1 stage II A Mammaprint: Luminal type A, low risk, did not need chemotherapy. Adjuvant radiation therapy from 11/28/2015 to08/18/2017  CurrentTreatment : Adjuvant antiestrogen therapy With anastrozole 1 mg daily started 02/07/16  Anastrozole Toxicities: 1. Intermittent night sweats  2. occasional hot flashes  3.complaining of weight gain   Surveillance: 1. Breast exam 11/20/2017: Benign 2. Mammogram  10/11/2017: Postsurgical changes benign-appearing calcifications in the left breast consistent with fat necrosis follow-up in 6 months is recommended in October, breast density category C    Weight gain issues:  Return to clinic in 1 year for follow-up

## 2017-11-20 NOTE — Progress Notes (Signed)
Patient Care Team: Tower, Jamie Fanny, MD as PCP - General  DIAGNOSIS:  Encounter Diagnosis  Name Primary?  . Malignant neoplasm of upper-outer quadrant of left breast in female, estrogen receptor positive ([place])     SUMMARY OF ONCOLOGIC HISTORY:   Breast cancer of upper-outer quadrant of left female breast ([place])   09/28/2015 Initial Diagnosis    Left breast biopsy 2:00 position: IDC with DCIS, grade 1, ER 100%, PR 20%, Ki-67 10%, HER-2 negative ratio 1.32, MRI revealed 7 x 5 x 10 mm in the posterior third left breast, T1 cN0 stage IA clinical stage      10/04/2015 Genetic Testing    There were no deleterious mutations or a variants of uncertain significance (VUSes) identified.  Genes tested: APC, ATM, AXIN2, BARD1, BMPR1A, BRCA1, BRCA2, BRIP1, CDH1, CDK4, CDKN2A, CHEK2, EPCAM, FANCC, MLH1, MSH2 (with MSH2 Exons 1-7 Inversion Analysis), MSH6, MUTYH, NBN, PALB2, PMS2, POLD1, POLE, PTEN, RAD51C, RAD51D, SCG5/GREM1, SMAD4, STK11, TP53, VHL, and XRCC2.      11/02/2015 Surgery    Left lumpectomy ([place]): IDC with DCIS, 1 cm, margins negative, 1/3 lymph nodes positive, ER 100%, PR 20%, Ki-67 10%, HER-2 negative ratio 1.32, T1bN1 stage IIA, Mammaprint luminal-type A, low risk      12/07/2015 - 02/03/2016 Radiation Therapy    Radiation ([place]): Left breast/left supraclav: 50.4Gy, 28 fractions, left axilla: 12.32 Gy, 7 fractions, left breast boost: 14.4 Gy in 8 fractions.        02/07/2016 -  Anti-estrogen oral therapy    Anastrozole 47m daily, goal 5 years of therapy       CHIEF COMPLIANT: Follow-up on anastrozole therapy  INTERVAL HISTORY: Jamie Shaffer a 89-yearwith above-mentioned history of left breast cancer treated with lumpectomy radiation is currently on anastrozole.  She appears to be tolerating it reasonably well.  She does have occasional hot flashes as well as muscle stiffness.  She had a mammogram in April which showed benign-appearing calcifications most likely fat  necrosis and a six-month follow-up was recommended.  She is scheduled that for October.  She denies any pain or discomfort.  Denies any lumps or nodules in the breast.  She complains of tenderness in the left axilla.  REVIEW OF SYSTEMS:   Constitutional: Denies fevers, chills or abnormal weight loss Eyes: Denies blurriness of vision Ears, nose, mouth, throat, and face: Denies mucositis or sore throat Respiratory: Denies cough, dyspnea or wheezes Cardiovascular: Denies palpitation, chest discomfort Gastrointestinal:  Denies nausea, heartburn or change in bowel habits Skin: Denies abnormal skin rashes Lymphatics: Denies new lymphadenopathy or easy bruising Neurological:Denies numbness, tingling or new weaknesses Behavioral/Psych: Mood is stable, no new changes  Extremities: No lower extremity edema Breast: Tenderness left axilla All other systems were reviewed with the patient and are negative.  I have reviewed the past medical history, past surgical history, social history and family history with the patient and they are unchanged from previous note.  ALLERGIES:  has No Known Allergies.  MEDICATIONS:  Current Outpatient Medications  Medication Sig Dispense Refill  . anastrozole (ARIMIDEX) 1 MG tablet Take 1 tablet (1 mg total) by mouth daily. 90 tablet 3  . Calcium Carbonate-Vitamin D (CALTRATE 600+D PO) Take by mouth daily.    . Multiple Vitamin (MULTIVITAMIN) tablet Take 1 tablet by mouth daily.       Current Facility-Administered Medications  Medication Dose Route Frequency Provider Last Rate Last Dose  . 0.9 %  sodium chloride infusion  500 mL Intravenous Continuous  Milus Banister, MD        PHYSICAL EXAMINATION: ECOG PERFORMANCE STATUS: 1 - Symptomatic but completely ambulatory  Vitals:   11/20/17 0833  BP: (!) 153/82  Pulse: 73  Resp: 18  Temp: 98.5 F (36.9 C)  SpO2: 99%   Filed Weights   11/20/17 0833  Weight: 177 lb 1.6 oz (80.3 kg)    GENERAL:alert, no  distress and comfortable SKIN: skin color, texture, turgor are normal, no rashes or significant lesions EYES: normal, Conjunctiva are pink and non-injected, sclera clear OROPHARYNX:no exudate, no erythema and lips, buccal mucosa, and tongue normal  NECK: supple, thyroid normal size, non-tender, without nodularity LYMPH:  no palpable lymphadenopathy in the cervical, axillary or inguinal LUNGS: clear to auscultation and percussion with normal breathing effort HEART: regular rate & rhythm and no murmurs and no lower extremity edema ABDOMEN:abdomen soft, non-tender and normal bowel sounds MUSCULOSKELETAL:no cyanosis of digits and no clubbing  NEURO: alert & oriented x 3 with fluent speech, no focal motor/sensory deficits EXTREMITIES: No lower extremity edema BREAST: Tenderness and small palpable nodule adjacent to the scar tissue in the left axilla.  This is being followed by another mammogram in October.  No palpable axillary supraclavicular or infraclavicular adenopathy no breast tenderness or nipple discharge. (exam performed in the presence of a chaperone)  LABORATORY DATA:  I have reviewed the data as listed CMP Latest Ref Rng & Units 01/04/2017 01/04/2016 09/03/2014  Glucose 70 - 99 mg/dL 94 91 94  BUN 6 - 23 mg/dL _0 Creatinine 0.40 - 1.20 mg/dL 0.74 0.68 0.74  Sodium 135 - 145 mEq/L 140 141 137  Potassium 3.5 - 5.1 mEq/L 4.0 4.1 4.3  Chloride 96 - 112 mEq/L 106 106 104  CO2 19 - 32 mEq/L _1 Calcium 8.4 - 10.5 mg/dL 9.6 9.2 9.4  Total Protein 6.0 - 8.3 g/dL 6.9 6.9 7.2  Total Bilirubin 0.2 - 1.2 mg/dL 0.5 0.5 0.4  Alkaline Phos 39 - 117 U/L 62 112 83  AST 0 - 37 U/L 18 40(H) 21  ALT 0 - 35 U/L 16 41(H) 23    Lab Results  Component Value Date   WBC 3.3 (L) 01/04/2017   HGB 11.7 (L) 01/04/2017   HCT 36.7 01/04/2017   MCV 85.2 01/04/2017   PLT 261.0 01/04/2017   NEUTROABS 1.6 01/04/2017    ASSESSMENT & PLAN:  Breast cancer of upper-outer quadrant of left female  breast ([place]) Left lumpectomy: IDC with DCIS, 1 cm, margins negative, 1/3 lymph nodes positive, ER 100%, PR 20%, Ki-67 10%, HER-2 negative ratio 1.32, T1bN1 stage II A Mammaprint: Luminal type A, low risk, did not need chemotherapy. Adjuvant radiation therapy from 11/28/2015 to08/18/2017  CurrentTreatment : Adjuvant antiestrogen therapy With anastrozole 1 mg daily started 02/07/16  Anastrozole Toxicities: 1. Intermittent night sweats  2. occasional hot flashes  3.complaining of weight gain  I encouraged her to be compliant with anastrozole.  I suspect she is not taking it regularly.  Surveillance: 1. Breast exam 11/20/2017: Small palpable nodule in the left axilla adjacent to the scar tissue was felt to be benign by mammogram.  She will have another mammogram in October. 2. Mammogram  10/11/2017: Postsurgical changes benign-appearing calcifications in the left breast consistent with fat necrosis follow-up in 6 months is recommended in October, breast density category C    Weight gain issues: Once again encouraged her to curb carbohydrate intake and exercise regularly.  Return to clinic in  1 year for follow-up    No orders of the defined types were placed in this encounter.  The patient has a good understanding of the overall plan. she agrees with it. she will call with any problems that may develop before the next visit here.   Harriette Ohara, MD 11/20/17

## 2017-11-21 ENCOUNTER — Telehealth: Payer: Self-pay | Admitting: Hematology and Oncology

## 2017-11-21 NOTE — Telephone Encounter (Signed)
Mailed patient calendar of upcoming June 2020 appointments.

## 2017-12-27 ENCOUNTER — Ambulatory Visit: Payer: 59 | Admitting: Radiation Oncology

## 2018-01-03 ENCOUNTER — Ambulatory Visit: Payer: 59 | Admitting: Radiation Oncology

## 2018-01-08 ENCOUNTER — Encounter: Payer: Self-pay | Admitting: Radiation Oncology

## 2018-01-08 ENCOUNTER — Other Ambulatory Visit: Payer: Self-pay

## 2018-01-08 ENCOUNTER — Ambulatory Visit
Admission: RE | Admit: 2018-01-08 | Discharge: 2018-01-08 | Disposition: A | Discharge status: Home / Self Care | Payer: 59 | Source: Ambulatory Visit | Attending: Radiation Oncology | Admitting: Radiation Oncology

## 2018-01-08 VITALS — BP 138/82 | HR 73 | Temp 95.3°F | Resp 20 | Wt 180.0 lb

## 2018-01-08 DIAGNOSIS — C50412 Malignant neoplasm of upper-outer quadrant of left female breast: Secondary | ICD-10-CM

## 2018-01-08 DIAGNOSIS — Z853 Personal history of malignant neoplasm of breast: Secondary | ICD-10-CM | POA: Diagnosis present

## 2018-01-08 DIAGNOSIS — Z170 Estrogen receptor positive status [ER+]: Secondary | ICD-10-CM

## 2018-01-08 NOTE — Progress Notes (Signed)
Radiation Oncology Follow up Note  Name: Jamie Shaffer   Date:   01/08/2018 MRN:  [id number] DOB: 03/14/1961    This 57 y.o. female presents to the clinic today for o year follow-up status post whole breast radiation to her left breast for stage IIa invasive mammary carcinoma.  REFERRING PROVIDER: Tower, [name], MD  HPI: patient is a 57 year old female now seen out 2 years having completed whole breast radiation to her left breast for stage IIa invasive mammary carcinoma ER/PR positive HER-2/neu negative. Seen today in routine follow-up she is doing well..she specifically denies any breast tenderness cough or bone pain. She recently had a mammogram showing   Some calcifications in the left lumpectomy site consistent with fat necrosis no mammographic evidence for malignancy was seen follow-up mammogram in 6 months was recommended.she is currently on arimadex time that well without side effect.  COMPLICATIONS OF TREATMENT: none  FOLLOW UP COMPLIANCE: keeps appointments   PHYSICAL EXAM:  BP 138/82   Pulse 73   Temp (!) 95.3 F (35.2 C)   Resp 20   Wt 180 lb 0.1 oz (81.7 kg)   BMI 32.14 kg/m  Lungs are clear to A&P cardiac examination essentially unremarkable with regular rate and rhythm. No dominant mass or nodularity is noted in either breast in 2 positions examined. Incision is well-healed. No axillary or supraclavicular adenopathy is appreciated. Cosmetic result is excellent.Well-developed well-nourished patient in NAD. HEENT reveals PERLA, EOMI, discs not visualized.  Oral cavity is clear. No oral mucosal lesions are identified. Neck is clear without evidence of cervical or supraclavicular adenopathy. Lungs are clear to A&P. Cardiac examination is essentially unremarkable with regular rate and rhythm without murmur rub or thrill. Abdomen is benign with no organomegaly or masses noted. Motor sensory and DTR levels are equal and symmetric in the upper and lower extremities. Cranial  nerves II through XII are grossly intact. Proprioception is intact. No peripheral adenopathy or edema is identified. No motor or sensory levels are noted. Crude visual fields are within normal range.  RADIOLOGY RESULTS: mammograms are reviewed and compatible with the above-stated findings  PLAN: present time patient continues to do well with no evidence of disease. I'm please were overall progress. I've reviewed her mammograms and agree with probable benign fat necrosis as a reason for her Ossification's in the lumpectomy site. I have asked to see her back in 6 months for follow-up. Patient is to call sooner with any concerns at any time.  I would like to take this opportunity to thank you for allowing me to participate in the care of your patient.Noreene Filbert, MD

## 2018-01-12 ENCOUNTER — Telehealth: Payer: Self-pay | Admitting: Family Medicine

## 2018-01-12 DIAGNOSIS — Z0000 Encounter for general adult medical examination without abnormal findings: Secondary | ICD-10-CM

## 2018-01-12 NOTE — Telephone Encounter (Signed)
-----   Message from Lendon Collar, RT sent at 01/08/2018 11:30 AM EDT ----- Regarding: Lab orders for Thursday 01/16/18 Please enter CPE lab orders for 01/16/18. Thanks-Lauren

## 2018-01-16 ENCOUNTER — Other Ambulatory Visit (INDEPENDENT_AMBULATORY_CARE_PROVIDER_SITE_OTHER): Payer: 59

## 2018-01-16 DIAGNOSIS — Z0000 Encounter for general adult medical examination without abnormal findings: Secondary | ICD-10-CM | POA: Diagnosis not present

## 2018-01-16 LAB — LIPID PANEL
CHOL/HDL RATIO: 3
Cholesterol: 215 mg/dL — ABNORMAL HIGH (ref 0–200)
HDL: 84.6 mg/dL (ref >39.00)
LDL Cholesterol: 116 mg/dL — ABNORMAL HIGH (ref 0–99)
NonHDL: 130.79
TRIGLYCERIDES: 76 mg/dL (ref 0.0–149.0)
VLDL: 15.2 mg/dL (ref 0.0–40.0)

## 2018-01-16 LAB — COMPREHENSIVE METABOLIC PANEL
ALT: 30 U/L (ref 0–35)
AST: 21 U/L (ref 0–37)
Albumin: 4.1 g/dL (ref 3.5–5.2)
Alkaline Phosphatase: 103 U/L (ref 39–117)
BILIRUBIN TOTAL: 0.5 mg/dL (ref 0.2–1.2)
BUN: 17 mg/dL (ref 6–23)
CO2: 30 meq/L (ref 19–32)
CREATININE: 0.74 mg/dL (ref 0.40–1.20)
Calcium: 10 mg/dL (ref 8.4–10.5)
Chloride: 104 mEq/L (ref 96–112)
GFR: 103.39 mL/min (ref >60.00)
GLUCOSE: 99 mg/dL (ref 70–99)
Potassium: 4.2 mEq/L (ref 3.5–5.1)
Sodium: 140 mEq/L (ref 135–145)
Total Protein: 7.6 g/dL (ref 6.0–8.3)

## 2018-01-16 LAB — CBC WITH DIFFERENTIAL/PLATELET
BASOS ABS: 0 10*3/uL (ref 0.0–0.1)
Basophils Relative: 0.9 % (ref 0.0–3.0)
EOS ABS: 0.1 10*3/uL (ref 0.0–0.7)
Eosinophils Relative: 2.4 % (ref 0.0–5.0)
HCT: 36.5 % (ref 36.0–46.0)
Hemoglobin: 12 g/dL (ref 12.0–15.0)
LYMPHS ABS: 1.4 10*3/uL (ref 0.7–4.0)
Lymphocytes Relative: 41.7 % (ref 12.0–46.0)
MCHC: 32.8 g/dL (ref 30.0–36.0)
MCV: 84 fl (ref 78.0–100.0)
MONO ABS: 0.5 10*3/uL (ref 0.1–1.0)
MONOS PCT: 13.1 % — AB (ref 3.0–12.0)
NEUTROS ABS: 1.4 10*3/uL (ref 1.4–7.7)
NEUTROS PCT: 41.9 % — AB (ref 43.0–77.0)
PLATELETS: 269 10*3/uL (ref 150.0–400.0)
RBC: 4.35 Mil/uL (ref 3.87–5.11)
RDW: 14.6 % (ref 11.5–15.5)
WBC: 3.5 10*3/uL — ABNORMAL LOW (ref 4.0–10.5)

## 2018-01-16 LAB — TSH: TSH: 1.34 u[IU]/mL (ref 0.35–4.50)

## 2018-01-20 ENCOUNTER — Ambulatory Visit (INDEPENDENT_AMBULATORY_CARE_PROVIDER_SITE_OTHER): Payer: 59 | Admitting: Family Medicine

## 2018-01-20 ENCOUNTER — Encounter: Payer: Self-pay | Admitting: Family Medicine

## 2018-01-20 VITALS — BP 112/68 | HR 77 | Temp 98.1°F | Ht 62.5 in | Wt 178.8 lb

## 2018-01-20 DIAGNOSIS — C773 Secondary and unspecified malignant neoplasm of axilla and upper limb lymph nodes: Secondary | ICD-10-CM | POA: Diagnosis not present

## 2018-01-20 DIAGNOSIS — Z0000 Encounter for general adult medical examination without abnormal findings: Secondary | ICD-10-CM | POA: Diagnosis not present

## 2018-01-20 DIAGNOSIS — Z800 Family history of malignant neoplasm of digestive organs: Secondary | ICD-10-CM

## 2018-01-20 DIAGNOSIS — E6609 Other obesity due to excess calories: Secondary | ICD-10-CM

## 2018-01-20 DIAGNOSIS — Z6832 Body mass index (BMI) 32.0-32.9, adult: Secondary | ICD-10-CM

## 2018-01-20 DIAGNOSIS — Z170 Estrogen receptor positive status [ER+]: Secondary | ICD-10-CM

## 2018-01-20 DIAGNOSIS — C50412 Malignant neoplasm of upper-outer quadrant of left female breast: Secondary | ICD-10-CM

## 2018-01-20 NOTE — Assessment & Plan Note (Signed)
Colonoscopy utd 2018- with 5 y recall

## 2018-01-20 NOTE — Assessment & Plan Note (Signed)
Reviewed health habits including diet and exercise and skin cancer prevention Reviewed appropriate screening tests for age  Also reviewed health mt list, fam hx and immunization status , as well as social and family history   See HPI F/u with gyn (sent for last dexa/pap report from Dr Corinna Capra) Disc shingrix vaccine Disc imp of vit D and Ca for bones as well as exercise  Will get a flu shot in the fall

## 2018-01-20 NOTE — Progress Notes (Signed)
Subjective:    Patient ID: Jamie Shaffer, female    DOB: 1960/12/25, 57 y.o.   MRN: [id number]  HPI Here for health maintenance exam and to review chronic medical problems    Working and taking care of grand kids  She is getting ready to go on vacation tomorrow  May travel a bit   Wt Readings from Last 3 Encounters:  01/20/18 178 lb 12 oz (81.1 kg)  01/08/18 180 lb 0.1 oz (81.7 kg)  11/20/17 177 lb 1.6 oz (80.3 kg)  eating healthy- on and off  Exercise- water aerobics twice weekly  Also some physicality with job  32.17 kg/m   Pap 8/16 neg Hx of partial hysterectomy  Goes to gyn regularly - still does some pap smears (last one in nov)   Bone density-per pt done at gyn Perhaps bone loss in spine -we sent for that (? Osteopenia)  On arimidex  Not reliably taking ca or D No falls or fx    Flu shot-will get in the fall   Mammogram 4/19- calcifications L lumpectomy site - 6 mo recall  Hx of breast cancer  Also strong family hx of breast cancer  Self breast exam - no breast lumps or changes   Tetanus shot 8/12  Colonoscopy 2/18  Neg with 5 y recall  fam hx of colon cancer   Had genetic testing done- all neg   Zoster status - may be interested in shingrix   BP Readings from Last 3 Encounters:  01/20/18 112/68  01/08/18 138/82  11/20/17 (!) 153/82   Pulse Readings from Last 3 Encounters:  01/20/18 77  01/08/18 73  11/20/17 73    Cholesterol  Lab Results  Component Value Date   CHOL 215 (H) 01/16/2018   CHOL 164 01/04/2017   CHOL 182 01/04/2016   Lab Results  Component Value Date   HDL 84.60 01/16/2018   HDL 65.60 01/04/2017   HDL 72.20 01/04/2016   Lab Results  Component Value Date   LDLCALC 116 (H) 01/16/2018   LDLCALC 83 01/04/2017   LDLCALC 100 (H) 01/04/2016   Lab Results  Component Value Date   TRIG 76.0 01/16/2018   TRIG 77.0 01/04/2017   TRIG 51.0 01/04/2016   Lab Results  Component Value Date   CHOLHDL 3 01/16/2018   CHOLHDL 3 01/04/2017   CHOLHDL 3 01/04/2016   Lab Results  Component Value Date   LDLDIRECT 93.0 02/26/2012   LDL is up a bit to 116 Good HDL in the 80s  More breakfast meats and eggs   Lab Results  Component Value Date   WBC 3.5 (L) 01/16/2018   HGB 12.0 01/16/2018   HCT 36.5 01/16/2018   MCV 84.0 01/16/2018   PLT 269.0 01/16/2018    Lab Results  Component Value Date   CREATININE 0.74 01/16/2018   BUN 17 01/16/2018   NA 140 01/16/2018   K 4.2 01/16/2018   CL 104 01/16/2018   CO2 30 01/16/2018   Lab Results  Component Value Date   ALT 30 01/16/2018   AST 21 01/16/2018   ALKPHOS 103 01/16/2018   BILITOT 0.5 01/16/2018   Lab Results  Component Value Date   TSH 1.34 01/16/2018    Takes iron   Patient Active Problem List   Diagnosis Date Noted  . Secondary and unspecified malignant neoplasm of axilla and upper limb lymph nodes ([place]) 01/20/2018  . Breast cancer of upper-outer quadrant of left female breast ([place]) 10/24/2015  .  Genetic testing 10/24/2015  . Family history of breast cancer in first degree relative 10/04/2015  . Family history of colon cancer 10/04/2015  . Acid reflux 09/20/2014  . Obesity 09/20/2014  . Menopausal symptoms 01/16/2013  . Routine general medical examination at a health care facility 02/04/2011  . FIBROCYSTIC BREAST DISEASE 10/20/2007  . PANIC DISORDER 10/17/2007   Past Medical History:  Diagnosis Date  . Cancer [facility]) 10-2015   left breast/last radiation tx08/17/ no chemo  . Fibrocystic breast   . GERD (gastroesophageal reflux disease)   . Panic disorder   . Personal history of radiation therapy   . Post-menopausal    Past Surgical History:  Procedure Laterality Date  . BREAST BIOPSY Left 09/28/2015  . BREAST LUMPECTOMY Left 11/02/2015  . BREAST LUMPECTOMY WITH RADIOACTIVE SEED AND SENTINEL LYMPH NODE BIOPSY Left 11/02/2015   Procedure: BREAST LUMPECTOMY WITH RADIOACTIVE SEED AND SENTINEL LYMPH NODE BIOPSY;  Surgeon: [name], MD;  Location: [facility];  Service: General;  Laterality: Left;  . CESAREAN SECTION    . fibroid tumor    . PILONIDAL CYST EXCISION    . VAGINAL HYSTERECTOMY     partial   Social History   Tobacco Use  . Smoking status: Never Smoker  . Smokeless tobacco: Never Used  . Tobacco comment: tried smoking as a teenager, but no continued use  Substance Use Topics  . Alcohol use: No  . Drug use: No   Family History  Problem Relation Age of Onset  . Hypertension Mother   . Diabetes Mother   . Heart failure Mother 38  . Breast cancer Mother 63       +lump  . Breast cancer Sister        dx. 63s; s/p mastectomy  . Breast cancer Sister 54       s/p lump; negative genetic testing  . Fibroids Sister   . Heart Problems Father        valve replacement  . Kidney failure Father 73  . Prostate cancer Father        dx. later age; did not cause his death  . Breast cancer Sister 62       "cancerous cells"; s/p radiation  . Fibroids Sister   . Fibroids Sister   . Bladder Cancer Brother 72       smoker; had issues for 7-8 yrs before going to the doctor  . Colon cancer Maternal Aunt        dx. 64s  . Heart Problems Maternal Uncle   . Lung cancer Brother        s/p surgery to remove nodule?; not a smoker; dx. 61-62  . Alzheimer's disease Maternal Aunt        d. 42  . Colon cancer Maternal Uncle        dx. in 37s or younger  . Lung cancer Cousin        maternal 1st cousin dx. 2s; smoker  . Prostate cancer Cousin        maternal 1st cousin dx. early 88s  . Melanoma Cousin        maternal 1st cousin dx. 53s  . Breast cancer Cousin 12       maternal 1st cousin   . Kidney cancer Cousin        maternal 1st cousin dx. 47s  . Bladder Cancer Paternal Uncle        dx. late 20s-80s  . Aneurysm Maternal Grandmother  brain; d. 67s  . Colon cancer Maternal Grandfather        d. 40s-50s  . Heart disease Paternal Grandmother        d. 1s  . Heart attack Paternal  Grandmother   . Heart disease Paternal Grandfather        d. 26s  . Heart attack Paternal Grandfather   . Breast cancer Cousin        paternal 1st cousin dx. 64s  . Cancer Cousin        (x2) paternal 1st cousins d. unspecified cancers at unspecified ages   No Known Allergies Current Outpatient Medications on File Prior to Visit  Medication Sig Dispense Refill  . anastrozole (ARIMIDEX) 1 MG tablet Take 1 tablet (1 mg total) by mouth daily. 90 tablet 3  . Calcium Carbonate-Vitamin D (CALTRATE 600+D PO) Take by mouth daily.    . ferrous sulfate 325 (65 FE) MG tablet Take 325 mg by mouth daily with breakfast.    . Multiple Vitamin (MULTIVITAMIN) tablet Take 1 tablet by mouth daily.       No current facility-administered medications on file prior to visit.      Review of Systems  Constitutional: Negative for activity change, appetite change, fatigue, fever and unexpected weight change.       Struggling to loose wt Arimidex makes it difficult   HENT: Negative for congestion, rhinorrhea, sore throat and trouble swallowing.   Eyes: Negative for pain, redness, itching and visual disturbance.  Respiratory: Negative for cough, chest tightness, shortness of breath and wheezing.   Cardiovascular: Negative for chest pain and palpitations.  Gastrointestinal: Negative for abdominal pain, blood in stool, constipation, diarrhea and nausea.  Endocrine: Negative for cold intolerance, heat intolerance, polydipsia and polyuria.  Genitourinary: Negative for difficulty urinating, dysuria, frequency and urgency.       Not flashes and night sweats from medication   Musculoskeletal: Negative for arthralgias, joint swelling and myalgias.  Skin: Negative for pallor and rash.  Neurological: Negative for dizziness, tremors, weakness, numbness and headaches.  Hematological: Negative for adenopathy. Does not bruise/bleed easily.  Psychiatric/Behavioral: Negative for decreased concentration and dysphoric mood. The  patient is not nervous/anxious.        Objective:   Physical Exam  Constitutional: She appears well-developed and well-nourished. No distress.  obese and well appearing   HENT:  Head: Normocephalic and atraumatic.  Right Ear: External ear normal.  Left Ear: External ear normal.  Mouth/Throat: Oropharynx is clear and moist.  Eyes: Pupils are equal, round, and reactive to light. Conjunctivae and EOM are normal. Right eye exhibits no discharge. Left eye exhibits no discharge. No scleral icterus.  Neck: Normal range of motion. Neck supple. No JVD present. Carotid bruit is not present. No thyromegaly present.  Cardiovascular: Normal rate, regular rhythm, normal heart sounds and intact distal pulses. Exam reveals no gallop.  Pulmonary/Chest: Effort normal and breath sounds normal. No respiratory distress. She has no wheezes. She exhibits no tenderness. No breast tenderness, discharge or bleeding.  Abdominal: Soft. Bowel sounds are normal. She exhibits no distension, no abdominal bruit and no mass. There is no tenderness.  Genitourinary: No breast tenderness, discharge or bleeding.  Genitourinary Comments: Breast exam: No mass, nodules, thickening, tenderness, bulging, retraction, inflamation, nipple discharge or skin changes noted.  No axillary or clavicular LA.    Scar tissue noted under and below scar in L axilla   Musculoskeletal: Normal range of motion. She exhibits no edema or tenderness.  Lymphadenopathy:  She has no cervical adenopathy.  Neurological: She is alert. She has normal reflexes. She displays normal reflexes. No cranial nerve deficit. She exhibits normal muscle tone. Coordination normal.  Skin: Skin is warm and dry. No rash noted. No erythema. No pallor.  Few skin tags -scattered   Psychiatric: She has a normal mood and affect.  Pleasant           Assessment & Plan:   Problem List Items Addressed This Visit      Immune and Lymphatic   Secondary and unspecified  malignant neoplasm of axilla and upper limb lymph nodes ([place])    Continues f/u with oncology and recall mammogram due in October  No change in exam         Other   Breast cancer of upper-outer quadrant of left female breast ([place])   Family history of colon cancer    Colonoscopy utd 2018- with 5 y recall       Obesity    Discussed how this problem influences overall health and the risks it imposes  Reviewed plan for weight loss with lower calorie diet (via better food choices and also portion control or program like weight watchers) and exercise building up to or more than 30 minutes 5 days per week including some aerobic activity         Routine general medical examination at a health care facility - Primary    Reviewed health habits including diet and exercise and skin cancer prevention Reviewed appropriate screening tests for age  Also reviewed health mt list, fam hx and immunization status , as well as social and family history   See HPI F/u with gyn (sent for last dexa/pap report from Dr Corinna Capra) Disc shingrix vaccine Disc imp of vit D and Ca for bones as well as exercise  Will get a flu shot in the fall

## 2018-01-20 NOTE — Assessment & Plan Note (Signed)
Continues f/u with oncology and recall mammogram due in October  No change in exam

## 2018-01-20 NOTE — Patient Instructions (Addendum)
You need at least 2000 iu of vitamin D daily  Calcium also if you can   Get a flu shot in the fall    If you are interested in the new shingles vaccine (Shingrix) - call your local pharmacy to check on coverage and availability - if affordable - get on wait list

## 2018-01-20 NOTE — Assessment & Plan Note (Signed)
Discussed how this problem influences overall health and the risks it imposes  Reviewed plan for weight loss with lower calorie diet (via better food choices and also portion control or program like weight watchers) and exercise building up to or more than 30 minutes 5 days per week including some aerobic activity    

## 2018-02-04 ENCOUNTER — Ambulatory Visit: Payer: 59 | Admitting: Internal Medicine

## 2018-02-04 ENCOUNTER — Encounter: Payer: Self-pay | Admitting: Internal Medicine

## 2018-02-04 VITALS — BP 124/76 | HR 58 | Temp 98.1°F | Wt 178.0 lb

## 2018-02-04 DIAGNOSIS — M654 Radial styloid tenosynovitis [de Quervain]: Secondary | ICD-10-CM

## 2018-02-04 DIAGNOSIS — M79644 Pain in right finger(s): Secondary | ICD-10-CM

## 2018-02-04 MED ORDER — NAPROXEN 500 MG PO TABS: 500.0000 mg | ORAL_TABLET | Freq: Two times a day (BID) | ORAL | 0 refills | Status: DC

## 2018-02-04 NOTE — Progress Notes (Signed)
Subjective:    Patient ID: Jamie Shaffer, female    DOB: 1961-01-07, 57 y.o.   MRN: [id number]  HPI  Pt presents to the clinic today with c/o right thumb pain. She reports this started 2 weeks ago. She describes the pain as sore, achy but can be sharp and stabbing with certain movements. The pain radiates up her forearm when trying to hold onto things. The pain intensifies with movement of the thumb. She denies numbness, tingling or weakness in the left hand. She denies any injury to the area. She has not taken anything OTC for her symptoms.  Review of Systems  Past Medical History:  Diagnosis Date  . Cancer [facility]) 10-2015   left breast/last radiation tx08/17/ no chemo  . Fibrocystic breast   . GERD (gastroesophageal reflux disease)   . Panic disorder   . Personal history of radiation therapy   . Post-menopausal     Current Outpatient Medications  Medication Sig Dispense Refill  . anastrozole (ARIMIDEX) 1 MG tablet Take 1 tablet (1 mg total) by mouth daily. 90 tablet 3  . Calcium Carbonate-Vitamin D (CALTRATE 600+D PO) Take by mouth daily.    . ferrous sulfate 325 (65 FE) MG tablet Take 325 mg by mouth daily with breakfast.    . Multiple Vitamin (MULTIVITAMIN) tablet Take 1 tablet by mouth daily.       No current facility-administered medications for this visit.     No Known Allergies  Family History  Problem Relation Age of Onset  . Hypertension Mother   . Diabetes Mother   . Heart failure Mother 45  . Breast cancer Mother 54       +lump  . Breast cancer Sister        dx. 57s; s/p mastectomy  . Breast cancer Sister 4       s/p lump; negative genetic testing  . Fibroids Sister   . Heart Problems Father        valve replacement  . Kidney failure Father 80  . Prostate cancer Father        dx. later age; did not cause his death  . Breast cancer Sister 62       "cancerous cells"; s/p radiation  . Fibroids Sister   . Fibroids Sister   . Bladder Cancer Brother  4       smoker; had issues for 7-8 yrs before going to the doctor  . Colon cancer Maternal Aunt        dx. 44s  . Heart Problems Maternal Uncle   . Lung cancer Brother        s/p surgery to remove nodule?; not a smoker; dx. 61-62  . Alzheimer's disease Maternal Aunt        d. 15  . Colon cancer Maternal Uncle        dx. in 59s or younger  . Lung cancer Cousin        maternal 1st cousin dx. 24s; smoker  . Prostate cancer Cousin        maternal 1st cousin dx. early 33s  . Melanoma Cousin        maternal 1st cousin dx. 58s  . Breast cancer Cousin 51       maternal 1st cousin   . Kidney cancer Cousin        maternal 1st cousin dx. 54s  . Bladder Cancer Paternal Uncle        dx. late 86s-80s  . Aneurysm  Maternal Grandmother        brain; d. 36s  . Colon cancer Maternal Grandfather        d. 40s-50s  . Heart disease Paternal Grandmother        d. 36s  . Heart attack Paternal Grandmother   . Heart disease Paternal Grandfather        d. 13s  . Heart attack Paternal Grandfather   . Breast cancer Cousin        paternal 1st cousin dx. 39s  . Cancer Cousin        (x2) paternal 1st cousins d. unspecified cancers at unspecified ages    Social History   Socioeconomic History  . Marital status: Married    Spouse name: Not on file  . Number of children: 2  . Years of education: Not on file  . Highest education level: Not on file  Occupational History  . Occupation: Environmental health practitioner: [employer]  Social Needs  . Financial resource strain: Not on file  . Food insecurity:    Worry: Not on file    Inability: Not on file  . Transportation needs:    Medical: Not on file    Non-medical: Not on file  Tobacco Use  . Smoking status: Never Smoker  . Smokeless tobacco: Never Used  . Tobacco comment: tried smoking as a teenager, but no continued use  Substance and Sexual Activity  . Alcohol use: No  . Drug use: No  . Sexual activity: Not on  file  Lifestyle  . Physical activity:    Days per week: Not on file    Minutes per session: Not on file  . Stress: Not on file  Relationships  . Social connections:    Talks on phone: Not on file    Gets together: Not on file    Attends religious service: Not on file    Active member of club or organization: Not on file    Attends meetings of clubs or organizations: Not on file    Relationship status: Not on file  . Intimate partner violence:    Fear of current or ex partner: Not on file    Emotionally abused: Not on file    Physically abused: Not on file    Forced sexual activity: Not on file  Other Topics Concern  . Not on file  Social History Narrative   Exercise regularly at the gym     Constitutional: Denies fever, malaise, fatigue, headache or abrupt weight changes.  Musculoskeletal: Pt reports right thumb pain. Denies difficulty with gait, muscle pain or joint swelling.    No other specific complaints in a complete review of systems (except as listed in HPI above).     Objective:   Physical Exam  BP 124/76   Pulse (!) 58   Temp 98.1 F (36.7 C) (Oral)   Wt 178 lb (80.7 kg)   SpO2 99%   BMI 32.04 kg/m  Wt Readings from Last 3 Encounters:  02/04/18 178 lb (80.7 kg)  01/20/18 178 lb 12 oz (81.1 kg)  01/08/18 180 lb 0.1 oz (81.7 kg)    General: Appears her stated age, well developed, well nourished in NAD. Musculoskeletal: Pain with flexion and extension of the right wrist. Normal rotation. Pain with flexion and extension of the right thumb. Enlargement noted of the right thumb PIP and DIP. No joint swelling noted. Hand grips equal. Neurological: Negative Phalen's, negative Tinel's.  BMET  Component Value Date/Time   NA 140 01/16/2018 0755   K 4.2 01/16/2018 0755   CL 104 01/16/2018 0755   CO2 30 01/16/2018 0755   GLUCOSE 99 01/16/2018 0755   BUN 17 01/16/2018 0755   CREATININE 0.74 01/16/2018 0755   CREATININE 0.72 01/16/2013 1639   CALCIUM 10.0  01/16/2018 0755   GFRNONAA 117.42 01/17/2010 0849   GFRAA 115 10/20/2007 0953    Lipid Panel     Component Value Date/Time   CHOL 215 (H) 01/16/2018 0755   TRIG 76.0 01/16/2018 0755   HDL 84.60 01/16/2018 0755   CHOLHDL 3 01/16/2018 0755   VLDL 15.2 01/16/2018 0755   LDLCALC 116 (H) 01/16/2018 0755    CBC    Component Value Date/Time   WBC 3.5 (L) 01/16/2018 0755   RBC 4.35 01/16/2018 0755   HGB 12.0 01/16/2018 0755   HCT 36.5 01/16/2018 0755   PLT 269.0 01/16/2018 0755   MCV 84.0 01/16/2018 0755   MCH 27.5 01/19/2016 0805   MCHC 32.8 01/16/2018 0755   RDW 14.6 01/16/2018 0755   LYMPHSABS 1.4 01/16/2018 0755   MONOABS 0.5 01/16/2018 0755   EOSABS 0.1 01/16/2018 0755   BASOSABS 0.0 01/16/2018 0755    Hgb A1C No results found for: HGBA1C          Assessment & Plan:   Right Thumb Pain:  Likely combination of arthritis and tendonitis eRx for Naproxen 500 mg BID x 7 -10 days- consume with food, avoid other NSAID's Thumb placed in thumb spica splint for comfort  Return precautions discussed Webb Silversmith, NP

## 2018-02-04 NOTE — Patient Instructions (Signed)
De Quervain Disease De Quervain disease is inflammation of the tendon on the thumb side of the wrist. Tendons are cords of tissue that connect bones to muscles. The tendons in your hand pass through a tunnel, or sheath. A slippery layer of tissue (synovium) lets the tendons move smoothly in the sheath. With de Quervain disease, the sheath swells or thickens, causing friction and pain. The condition is also called de Quervain tendinosis and de Quervain syndrome. It occurs most often in women who are 30-50 years old. What are the causes? The exact cause of de Quervain disease is not known. It may result from:  Overusing your hands, especially with repetitive motions that involve twisting your hand or using a forceful grip.  Pregnancy.  Rheumatoid disease.  What increases the risk? You may have a greater risk for de Quervain disease if you:  Are a middle-aged woman.  Are pregnant.  Have rheumatoid arthritis.  Have diabetes.  Use your hands far more than normal, especially with a tight grip or excessive twisting.  What are the signs or symptoms? Pain on the thumb side of your wrist is the main symptom of de Quervain disease. Other signs and symptoms include:  Pain that gets worse when you grasp something or turn your wrist.  Pain that extends up the forearm.  Cysts in the area of the pain.  Swelling of your wrist and hand.  A sensation of snapping in the wrist.  Trouble moving the thumb and wrist.  How is this diagnosed? Your health care provider may diagnose de Quervain disease based on your signs and symptoms. A physical exam will also be done. A simple test (Finkelstein test) that involves pulling your thumb and wrist to see if this causes pain can help determine whether you have the condition. Sometimes you may need to have an X-ray. How is this treated? Avoiding any activity that causes pain and swelling is the best treatment. Other options include:  Wearing a  splint.  Taking medicine. Anti-inflammatory medicines and corticosteroid injections may reduce inflammation and relieve pain.  Having surgery if other treatments do not work.  Follow these instructions at home:  Using ice can be helpful after doing activities that involve the sore wrist. To apply ice to the injured area: ? Put ice in a plastic bag. ? Place a towel between your skin and the bag. ? Leave the ice on for 20 minutes, 2-3 times a day.  Take medicines only as directed by your health care provider.  Wear your splint as directed. This will allow your hand to rest and heal. Contact a health care provider if:  Your pain medicine does not help.  Your pain gets worse.  You develop new symptoms. This information is not intended to replace advice given to you by your health care provider. Make sure you discuss any questions you have with your health care provider. Document Released: 02/27/2001 Document Revised: 11/10/2015 Document Reviewed: 10/07/2013 Elsevier Interactive Patient Education  2018 Elsevier Inc.  

## 2018-04-04 IMAGING — US US BREAST*L* LIMITED INC AXILLA
1 series · 12 of 12 positions shown · non-contrast
Comparison: Previous exam(s).

CLINICAL DATA: Possible mass left breast identified on one view of
the recent tomogram. The patient reports to me that her mother and 3
sisters have a history of breast cancer.

EXAM:
2D DIGITAL DIAGNOSTIC LEFT MAMMOGRAM WITH CAD AND ADJUNCT TOMO
ULTRASOUND LEFT BREAST

[Series 1: us breast*left* limited inc axilla · 0.06mm/px · 12 of 12 slices shown]
[im 1/12]
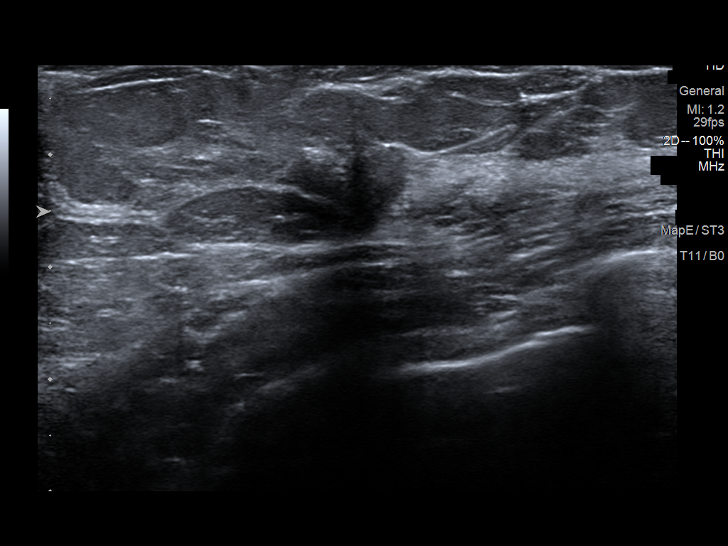
[im 2/12]
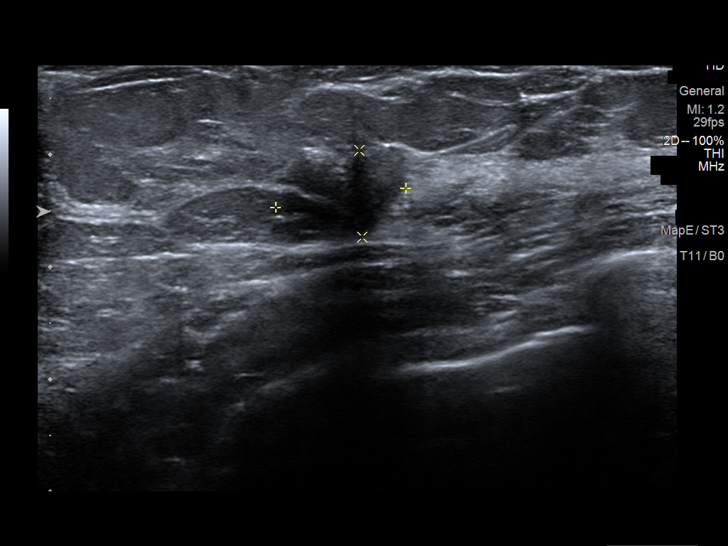
[im 3/12]
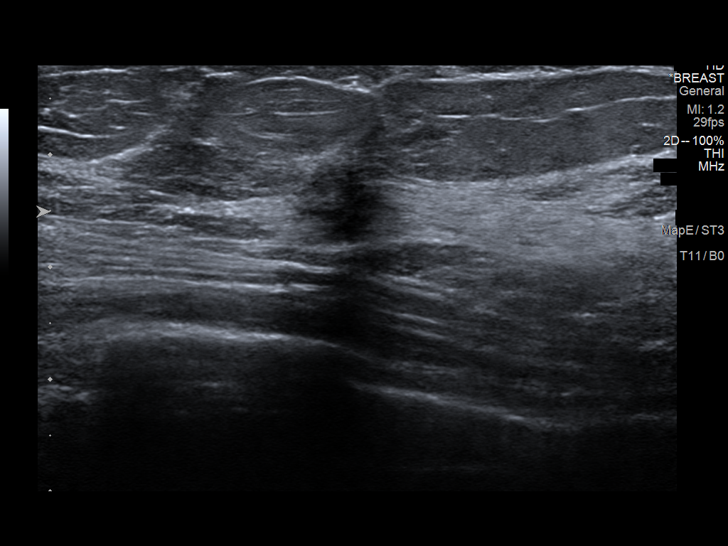
[im 4/12]
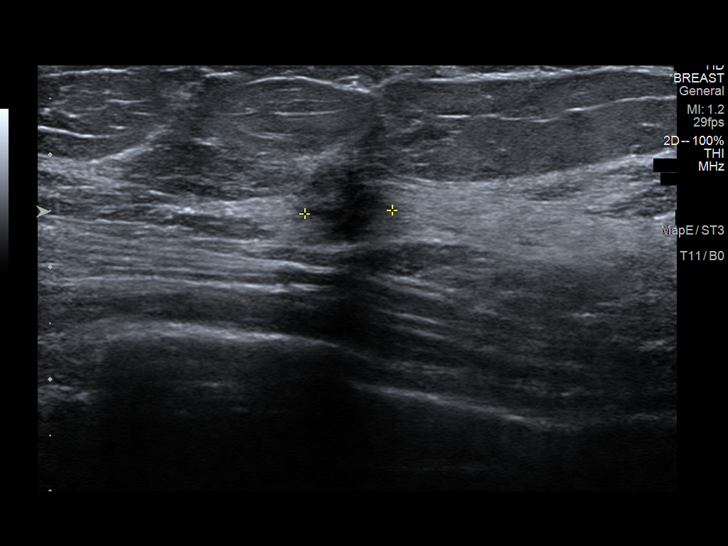
[im 5/12]
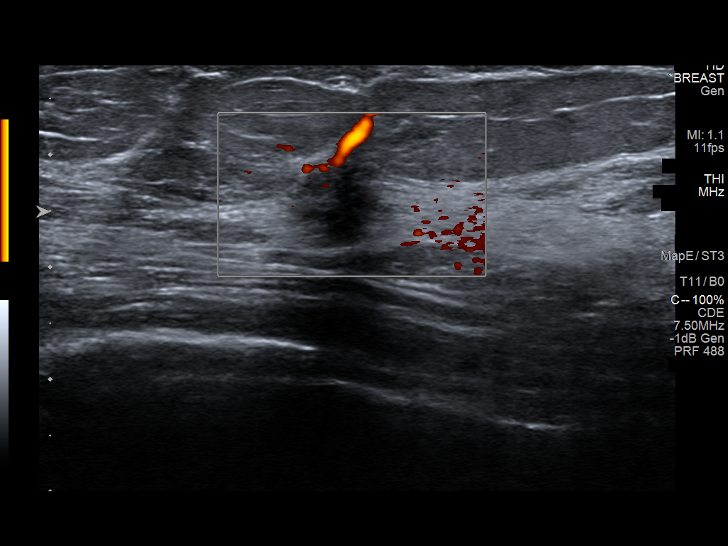
[im 6/12]
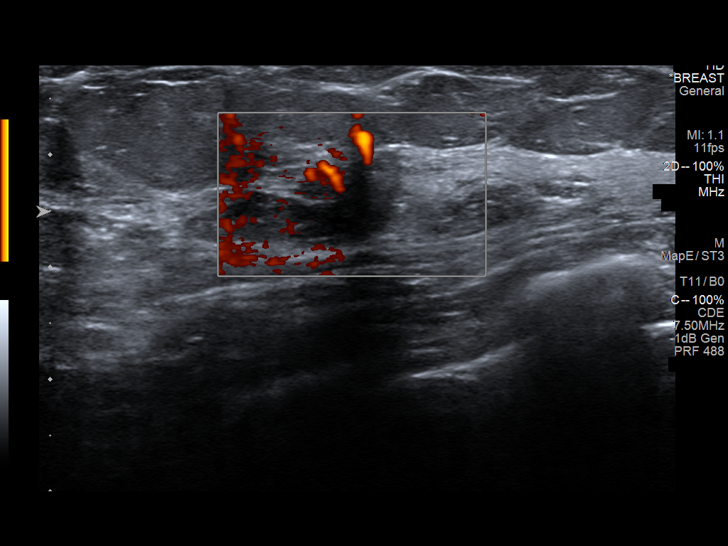
[im 7/12]
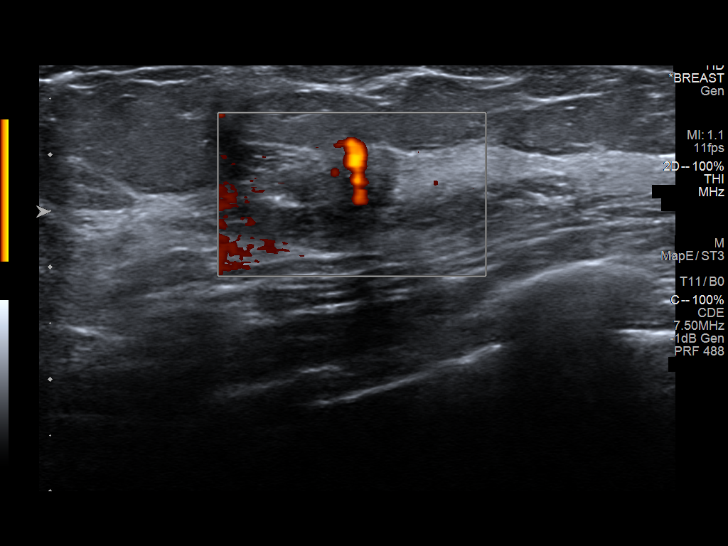
[im 8/12]
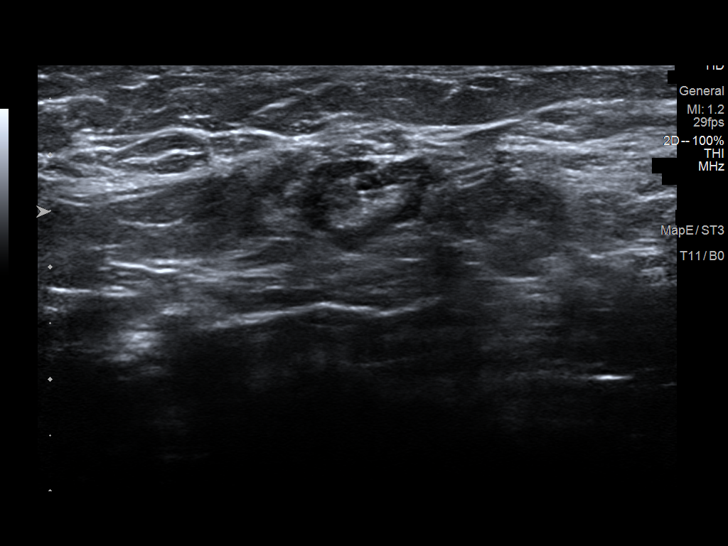
[im 9/12]
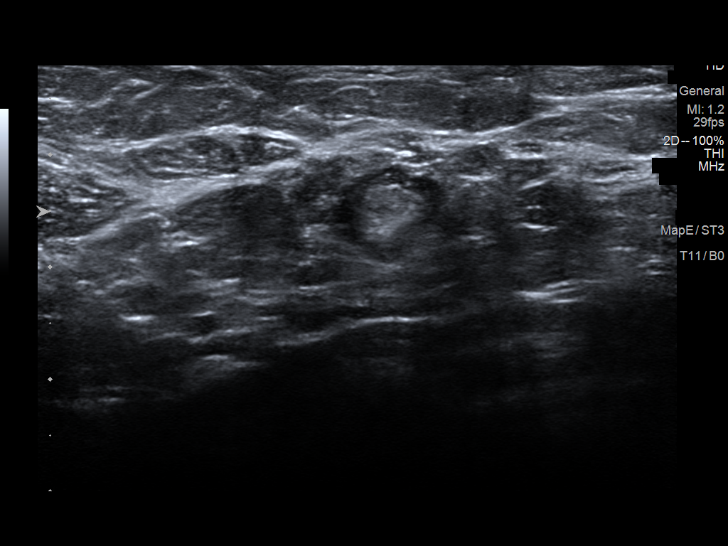
[im 10/12]
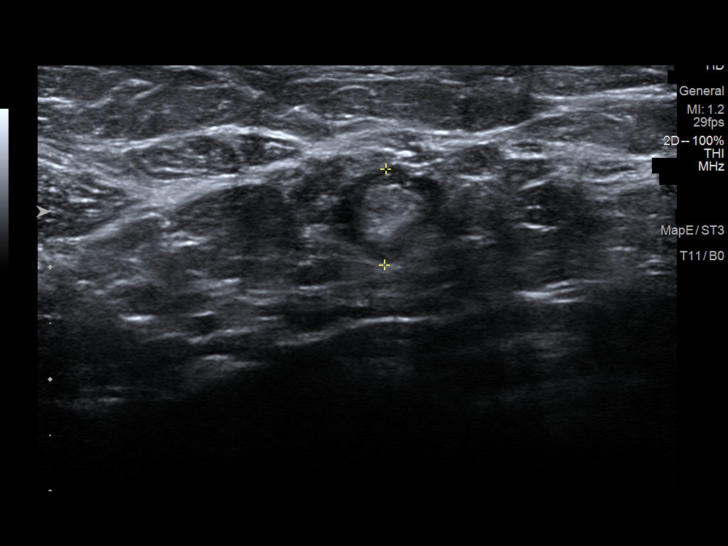
[im 11/12]
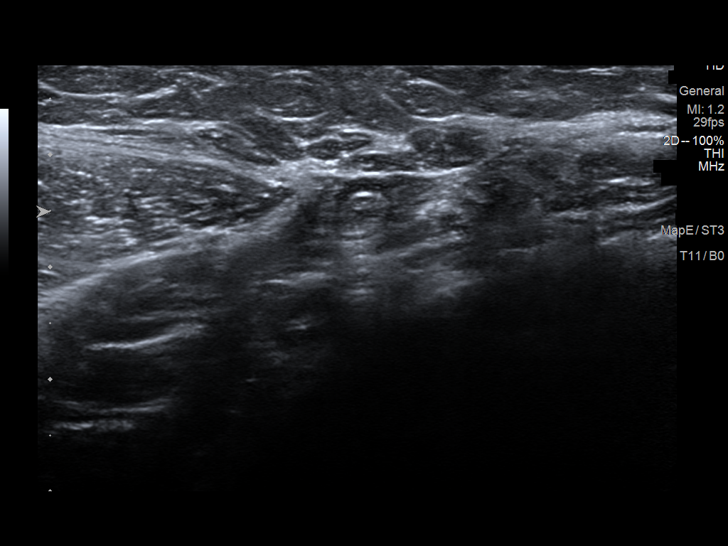
[im 12/12]
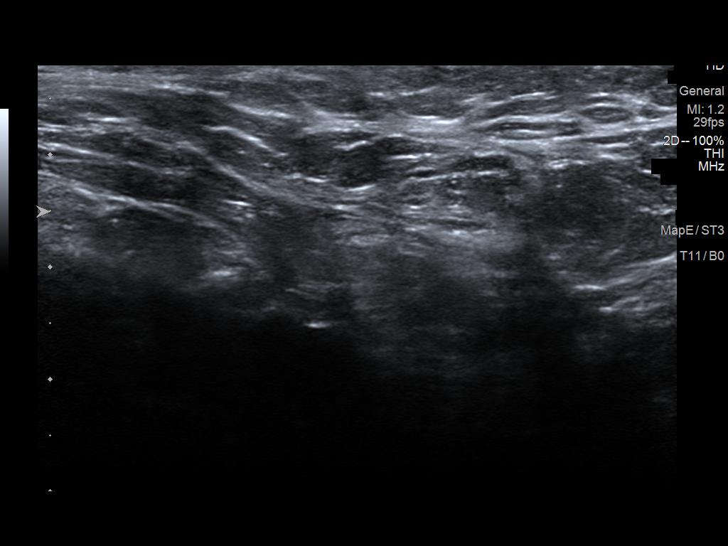

[12 of 12 positions shown; findings below may reference images not displayed]

ACR Breast Density Category c: The breast tissue is heterogeneously
dense, which may obscure small masses.
FINDINGS: Focal spot compression tomographic views of the outer left breast
show a possible small obscured mass. On a 90 degree lateral
tomographic set of images, a possible corresponding mass is seen
superiorly on slice 14.

Mammographic images were processed with CAD.

On physical exam, there is a very subtle palpable area of firmness
in the 2 o'clock position of the left breast approximately 7 cm from
the nipple.

Targeted ultrasound is performed, showing an irregular hypoechoic
mass with vascularity is imaged to 2 o'clock position 7 cm from the
nipple. This mass measures 1.2 x 0.8 x 0.8 cm. Additional ultrasound
of the upper-outer left breast shows heterogeneously dense breast
parenchyma without definite additional masses.

Ultrasound of the left axilla shows normal appearing axillary nodes.
No lymphadenopathy.
IMPRESSION: 1.2 cm suspicious mass at 2 o'clock position left breast 7 cm from
the nipple.

RECOMMENDATION:
Ultrasound-guided core needle biopsy is recommended and has been
scheduled for [DATE] at [DATE] p.m. Given the patient's dense breast
parenchyma and strong family history of breast cancer, bilateral
breast MRI is suggested, regardless of the biopsy results.

I have discussed the findings and recommendations with the patient.
Results were also provided in writing at the conclusion of the
visit. If applicable, a reminder letter will be sent to the patient
regarding the next appointment.

BI-RADS CATEGORY  4: Suspicious.

## 2018-04-15 ENCOUNTER — Other Ambulatory Visit: Payer: Self-pay | Admitting: Hematology and Oncology

## 2018-04-15 ENCOUNTER — Ambulatory Visit
Admission: RE | Admit: 2018-04-15 | Discharge: 2018-04-15 | Disposition: A | Discharge status: Home / Self Care | Payer: 59 | Source: Ambulatory Visit | Attending: Hematology and Oncology | Admitting: Hematology and Oncology

## 2018-04-15 DIAGNOSIS — R921 Mammographic calcification found on diagnostic imaging of breast: Secondary | ICD-10-CM

## 2018-04-21 IMAGING — MR MR BREAST BILAT WO/W CM
7 of 12 series · 25 of 48 positions shown · IV contrast (multihance)
Comparison: Previous exam(s) dating back to 8191.

CLINICAL DATA: 54-year-old patient with recent diagnosis of grade 1
invasive ductal carcinoma and ductal carcinoma in situ of the left
breast following ultrasound-guided biopsy of a mass in the 2 o'clock
position. Strong family history of breast cancer.

LABS:  None obtained
EXAM:
BILATERAL BREAST MRI WITH AND WITHOUT CONTRAST
TECHNIQUE: Multiplanar, multisequence MR images of both breasts were obtained
prior to and following the intravenous administration of 15 ml of
MultiHance.

[Series 2: STIR · axial · 3.0mm · 0.94mm/px · 1 of 62 slices shown]
[im 1/62]
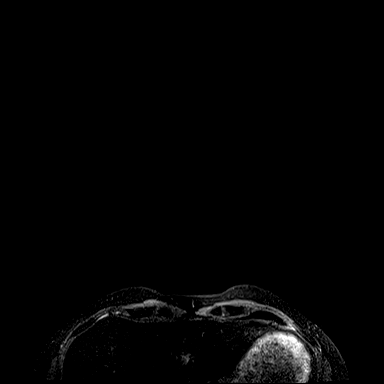

[Series 3: fl3d pre no · axial · non-contrast · 1.0mm · 0.79mm/px · z∈[-65,+110]mm · 4 of 175 slices shown]
[im 1/175]
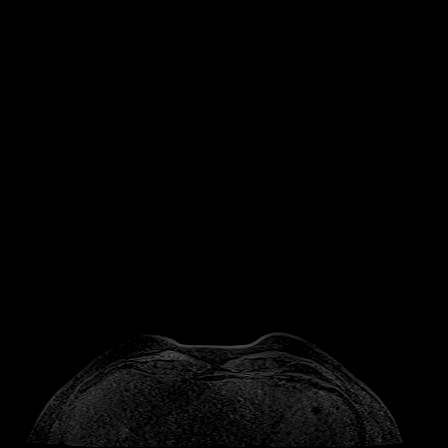
[im 59/175]
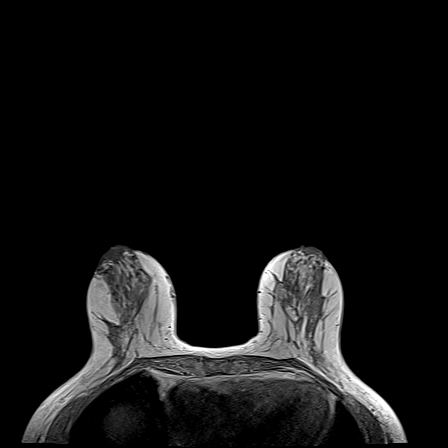
[im 117/175]
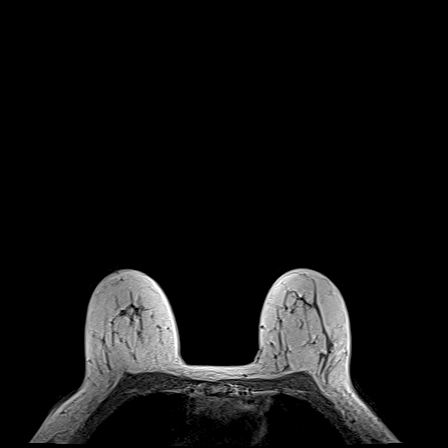
[im 175/175]
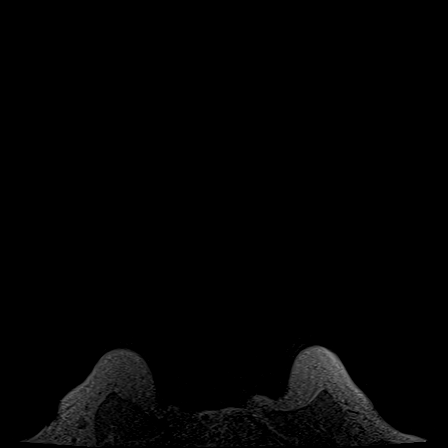

[Series 4: axial pre fs · axial · non-contrast · 1.0mm · 0.80mm/px · z∈[-88,+119]mm · 5 of 208 slices shown]
[im 1/208]
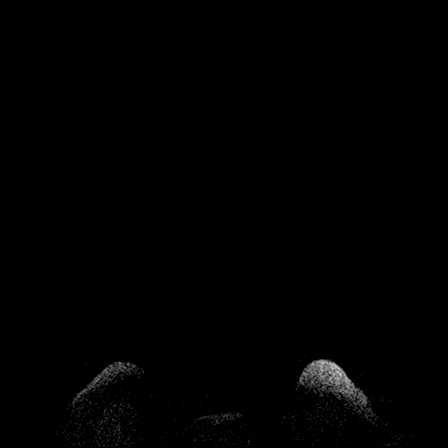
[im 52/208]
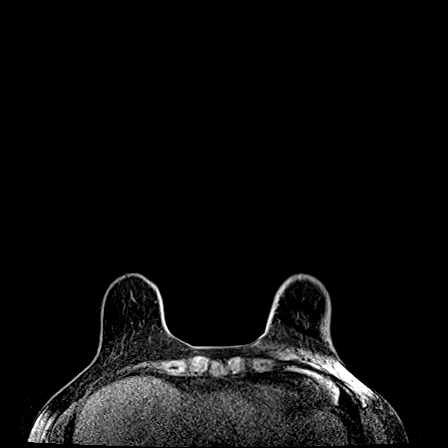
[im 104/208]
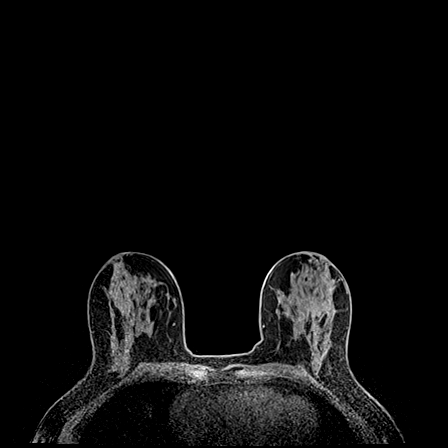
[im 156/208]
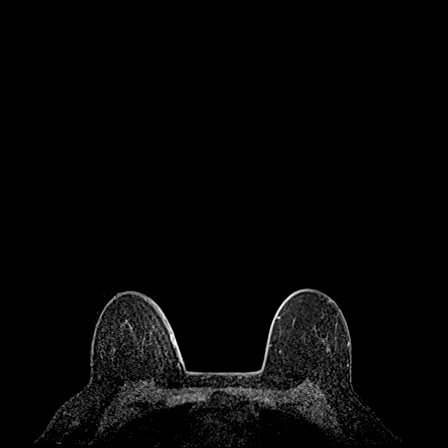
[im 208/208]
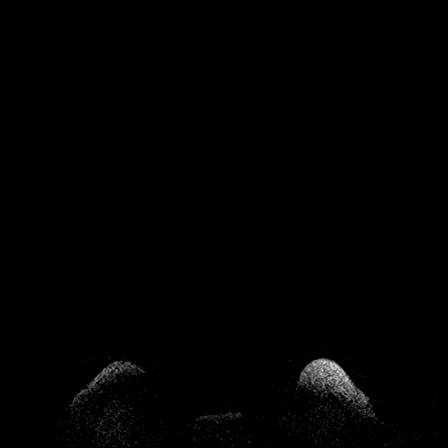

[Series 5: axial post 20 · axial · 1.0mm · 0.80mm/px · z∈[-88,+119]mm · 5 of 208 slices shown (1 of 3)]
[im 1/208]
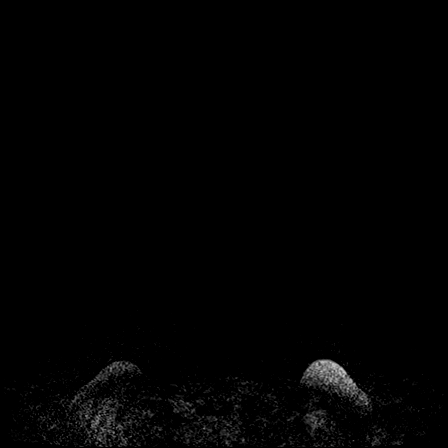
[im 52/208]
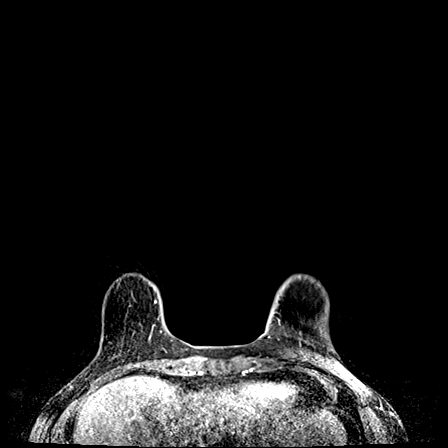
[im 104/208]
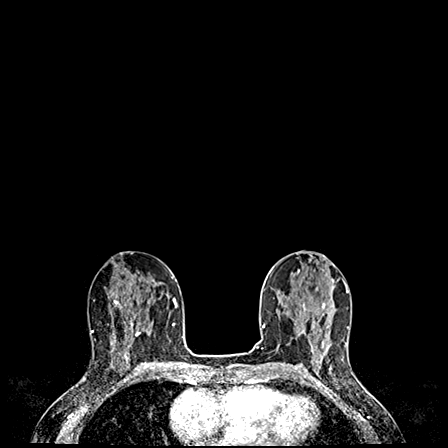
[im 156/208]
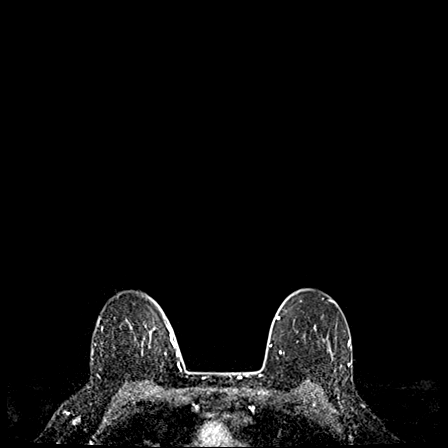
[im 208/208]
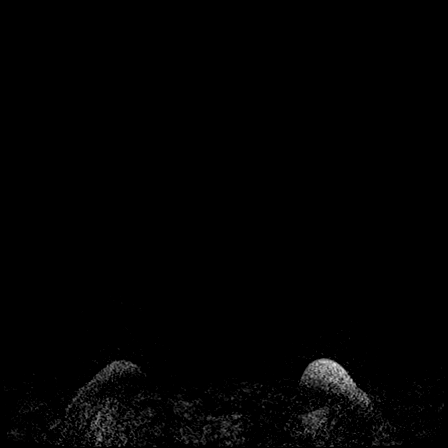

[Series 6: axial post 20 · axial · 1.0mm · 0.80mm/px · z∈[-88,+119]mm · 6 of 208 slices shown (2 of 3)]
[im 1/208]
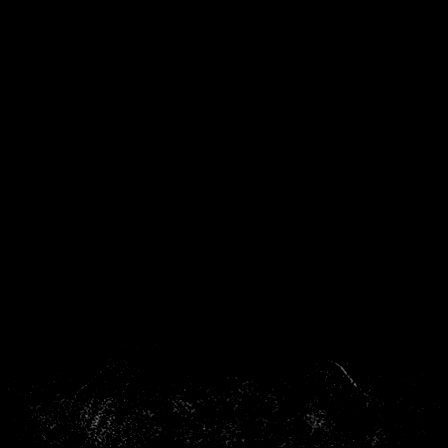
[im 42/208]
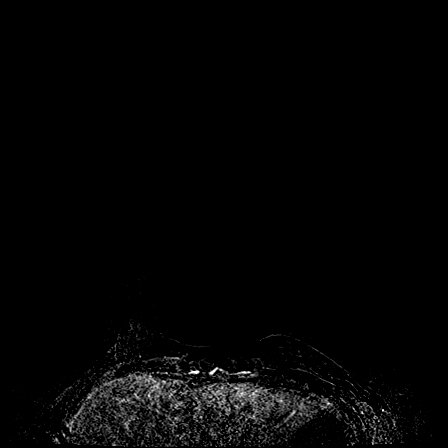
[im 83/208]
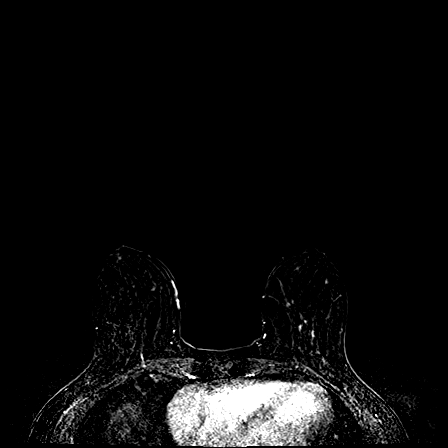
[im 125/208]
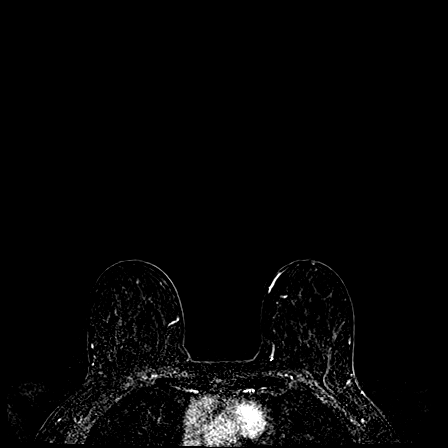
[im 166/208]
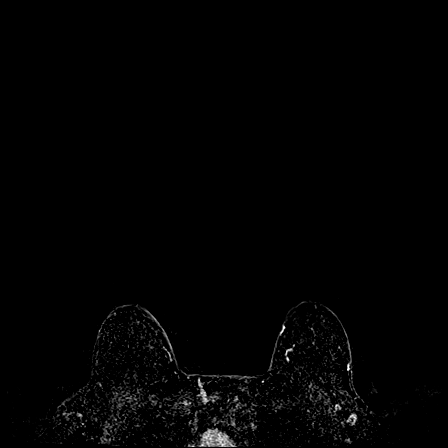
[im 208/208]
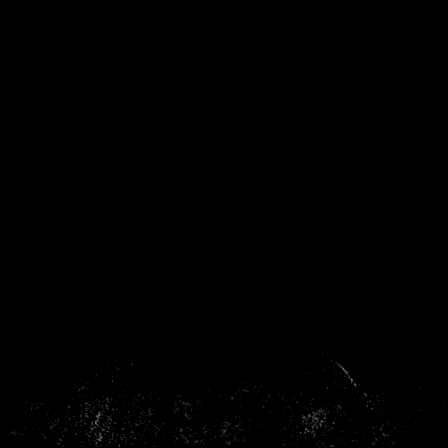

[Series 7: axial post 20 · axial · 208.0mm · 0.80mm/px · 1 of 1 slices shown (3 of 3)]
[im 1/1]
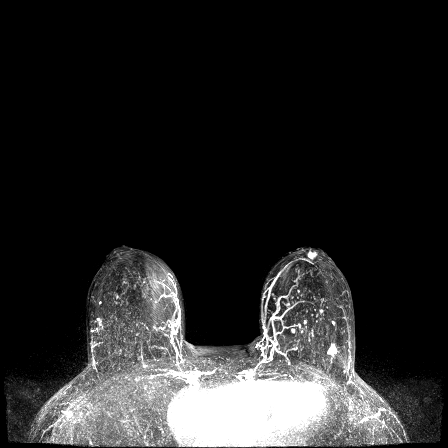

[Series 8: axial post 3 · axial · 1.0mm · 0.80mm/px · z∈[-88,-6]mm · 3 of 208 slices shown]
[im 1/208]
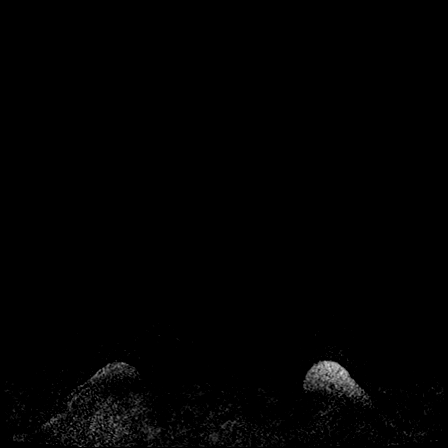
[im 42/208]
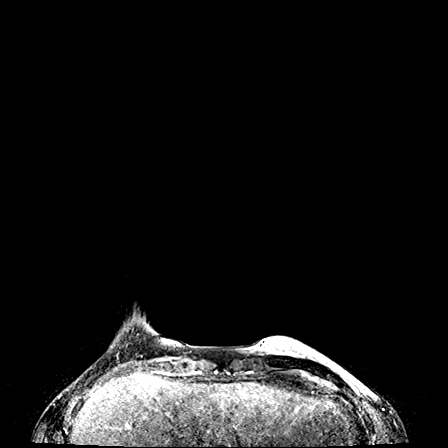
[im 83/208]
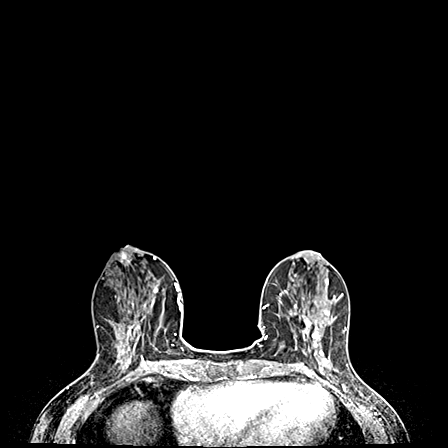

[25 of 48 positions shown; findings below may reference images not displayed]

THREE-DIMENSIONAL MR IMAGE RENDERING ON INDEPENDENT WORKSTATION:

Three-dimensional MR images were rendered by post-processing of the
original MR data on an independent workstation. The
three-dimensional MR images were interpreted, and findings are
reported in the following complete MRI report for this study. Three
dimensional images were evaluated at the independent [employer]
workstation
FINDINGS: Breast composition: c. Heterogeneous fibroglandular tissue.

Background parenchymal enhancement: Mild

Right breast: No mass or abnormal enhancement.

Left breast: In the posterior third of the upper outer quadrant of
the left breast is an irregular enhancing mass with washout kinetics
that measures 7 x 5 x 10 mm. There is associated biopsy clip
artifact. This corresponds to the recently biopsied invasive ductal
carcinoma/ductal carcinoma in situ.

The left nipple appears to be partially inverted, with no
significant nipple extending beyond the skin line. There is a
smoothly marginated but intensely enhancing oval area in the
retroareolar left breast likely associated with the left nipple.
When correlated with multiple prior mammograms dating back to 8191,
this is favored to be enhancement of the partially retroverted left
nipple, rather than a suspicious mass.

Lymph nodes: No abnormal appearing lymph nodes.

Ancillary findings:  None.
IMPRESSION: 1. Biopsy proven malignancy in the posterior third of the upper
outer quadrant left breast measures 7 x 5 x 10 mm on MRI.
2. Probable prominent enhancement of a partially retroverted left
nipple. A retroareolar enhancing mass is felt to be less likely. I
recommend the patient return for direct visual inspection and
ultrasound of the retroareolar left breast.

RECOMMENDATION:
Second-look ultrasound of the left breast as described above.

BI-RADS CATEGORY  6: Known biopsy-proven malignancy.

## 2018-05-19 LAB — HM PAP SMEAR: HM Pap smear: NEGATIVE

## 2018-05-29 DIAGNOSIS — Z01419 Encounter for gynecological examination (general) (routine) without abnormal findings: Secondary | ICD-10-CM | POA: Diagnosis not present

## 2018-05-29 DIAGNOSIS — Z6831 Body mass index (BMI) 31.0-31.9, adult: Secondary | ICD-10-CM | POA: Diagnosis not present

## 2018-08-08 ENCOUNTER — Telehealth: Payer: Self-pay | Admitting: Family Medicine

## 2018-08-08 MED ORDER — ALPRAZOLAM 0.5 MG PO TABS: 0.5000 mg | ORAL_TABLET | Freq: Two times a day (BID) | ORAL | 0 refills | Status: DC | PRN

## 2018-08-08 NOTE — Telephone Encounter (Signed)
I sent it in  Use caution /do not drive with it  So sorry to hear that  Follow up if needed

## 2018-08-08 NOTE — Telephone Encounter (Signed)
Pt has had death in her family and is feeling very overwhelmed. Pt want a prescription for xanax to help her out. Please call pt to advise.  Sent to [facility]

## 2018-08-08 NOTE — Telephone Encounter (Signed)
Pt notified Rx sent to pharmacy and advised of Dr. Marliss Coots comments and instructions

## 2018-08-19 ENCOUNTER — Telehealth: Payer: Self-pay

## 2018-08-19 ENCOUNTER — Ambulatory Visit: Payer: Self-pay | Admitting: Family Medicine

## 2018-08-19 NOTE — Telephone Encounter (Signed)
I spoke to patient and she said she's feeling better. [place] told me I could cancel the appointment.

## 2018-08-19 NOTE — Telephone Encounter (Signed)
[place] Night - Client Nonclinical Telephone Record [facility] Night - Client Client Site [place] Physician Loura Pardon - MD Contact Type Call Who Is Calling Patient / Member / Family / Caregiver Caller Name [place] Phone Number [phone] Patient Name Jamie Shaffer Patient DOB [birth year] Call Type Message Only Information Provided Reason for Call Request to [place] Appointment Initial Comment Caller states she is wanting to cancel her appointment. Additional Comment Call Closed By: Memory Argue Transaction Date/Time: 08/18/2018 7:02:44 PM (ET)

## 2018-10-16 ENCOUNTER — Ambulatory Visit
Admission: RE | Admit: 2018-10-16 | Discharge: 2018-10-16 | Disposition: A | Discharge status: Home / Self Care | Payer: BLUE CROSS/BLUE SHIELD | Source: Ambulatory Visit | Attending: Hematology and Oncology | Admitting: Hematology and Oncology

## 2018-10-16 DIAGNOSIS — R921 Mammographic calcification found on diagnostic imaging of breast: Secondary | ICD-10-CM | POA: Diagnosis not present

## 2018-11-18 ENCOUNTER — Telehealth: Payer: Self-pay | Admitting: Hematology and Oncology

## 2018-11-18 NOTE — Telephone Encounter (Signed)
Talk with patient regarding my chart video visit for 6/10

## 2018-11-18 NOTE — Assessment & Plan Note (Signed)
Left lumpectomy: IDC with DCIS, 1 cm, margins negative, 1/3 lymph nodes positive, ER 100%, PR 20%, Ki-67 10%, HER-2 negative ratio 1.32, T1bN1 stage II A Mammaprint: Luminal type A, low risk, did not need chemotherapy. Adjuvant radiation therapy from 11/28/2015 to08/18/2017  CurrentTreatment : Adjuvant antiestrogen therapy With anastrozole 1 mg daily started 02/07/16  Anastrozole Toxicities: 1. Intermittent night sweats  2. occasional hot flashes  3.complaining of weight gain I encouraged her to be compliant with anastrozole.  I suspect she is not taking it regularly.  Surveillance: 1.Breast exam 11/20/2017: Small palpable nodule in the left axilla adjacent to the scar tissue was felt to be benign by mammogram.  She will have another mammogram in October. 2.Mammogram  10/16/2018: Benign breast density category C  Weight gain issues: Once again encouraged her to curb carbohydrate intake and exercise regularly.  Return to clinic in 1 year for follow-up

## 2018-11-25 NOTE — Progress Notes (Signed)
HEMATOLOGY-ONCOLOGY DOXIMITY VISIT PROGRESS NOTE  I connected with Jamie Shaffer on 11/26/2018 at  8:45 AM EDT by Doximity video conference and verified that I am speaking with the correct person using two identifiers.  I discussed the limitations, risks, security and privacy concerns of performing an evaluation and management service by Doximity and the availability of in person appointments.  I also discussed with the patient that there may be a patient responsible charge related to this service. The patient expressed understanding and agreed to proceed.  Patient's Location: Home Physician Location: Clinic  CHIEF COMPLIANT: Follow-up on anastrozole therapy  INTERVAL HISTORY: Jamie Shaffer is a 58 y.o. female with above-mentioned history of left breast cancer treated with lumpectomy, radiation, and who is currently on anastrozole. I last saw her a year ago. Her most recent mammogram on 10/16/18 showed no evidence of malignancy, and stable calcifications favored for fat necrosis at the lumpectomy site. She presents over Doximity today for annual follow-up.     Breast cancer of upper-outer quadrant of left female breast ([place])   09/28/2015 Initial Diagnosis    Left breast biopsy 2:00 position: IDC with DCIS, grade 1, ER 100%, PR 20%, Ki-67 10%, HER-2 negative ratio 1.32, MRI revealed 7 x 5 x 10 mm in the posterior third left breast, T1 cN0 stage IA clinical stage    10/04/2015 Genetic Testing    There were no deleterious mutations or a variants of uncertain significance (VUSes) identified.  Genes tested: APC, ATM, AXIN2, BARD1, BMPR1A, BRCA1, BRCA2, BRIP1, CDH1, CDK4, CDKN2A, CHEK2, EPCAM, FANCC, MLH1, MSH2 (with MSH2 Exons 1-7 Inversion Analysis), MSH6, MUTYH, NBN, PALB2, PMS2, POLD1, POLE, PTEN, RAD51C, RAD51D, SCG5/GREM1, SMAD4, STK11, TP53, VHL, and XRCC2.    11/02/2015 Surgery    Left lumpectomy ([place]): IDC with DCIS, 1 cm, margins negative, 1/3 lymph nodes positive, ER  100%, PR 20%, Ki-67 10%, HER-2 negative ratio 1.32, T1bN1 stage IIA, Mammaprint luminal-type A, low risk    12/07/2015 - 02/03/2016 Radiation Therapy    Radiation ([place]): Left breast/left supraclav: 50.4Gy, 28 fractions, left axilla: 12.32 Gy, 7 fractions, left breast boost: 14.4 Gy in 8 fractions.      02/07/2016 -  Anti-estrogen oral therapy    Anastrozole 33m daily, goal 5 years of therapy     REVIEW OF SYSTEMS:   Constitutional: Denies fevers, chills or abnormal weight loss Eyes: Denies blurriness of vision Ears, nose, mouth, throat, and face: Denies mucositis or sore throat Respiratory: Denies cough, dyspnea or wheezes Cardiovascular: Denies palpitation, chest discomfort Gastrointestinal:  Denies nausea, heartburn or change in bowel habits Skin: Denies abnormal skin rashes Lymphatics: Denies new lymphadenopathy or easy bruising Neurological:Denies numbness, tingling or new weaknesses Behavioral/Psych: Mood is stable, no new changes  Extremities: No lower extremity edema Breast: denies any pain or lumps or nodules in either breasts All other systems were reviewed with the patient and are negative.  Observations/Objective:  There were no vitals filed for this visit. There is no height or weight on file to calculate BMI.  I have reviewed the data as listed CMP Latest Ref Rng & Units 01/16/2018 01/04/2017 01/04/2016  Glucose 70 - 99 mg/dL 99 94 91  BUN 6 - 23 mg/dL '17 15 13  ' Creatinine 0.40 - 1.20 mg/dL 0.74 0.74 0.68  Sodium 135 - 145 mEq/L 140 140 141  Potassium 3.5 - 5.1 mEq/L 4.2 4.0 4.1  Chloride 96 - 112 mEq/L 104 106 106  CO2 19 - 32 mEq/L 30 30 30  Calcium 8.4 - 10.5 mg/dL 10.0 9.6 9.2  Total Protein 6.0 - 8.3 g/dL 7.6 6.9 6.9  Total Bilirubin 0.2 - 1.2 mg/dL 0.5 0.5 0.5  Alkaline Phos 39 - 117 U/L 103 62 112  AST 0 - 37 U/L 21 18 40(H)  ALT 0 - 35 U/L 30 16 41(H)    Lab Results  Component Value Date   WBC 3.5 (L) 01/16/2018   HGB 12.0 01/16/2018   HCT 36.5  01/16/2018   MCV 84.0 01/16/2018   PLT 269.0 01/16/2018   NEUTROABS 1.4 01/16/2018      Assessment Plan:  Breast cancer of upper-outer quadrant of left female breast ([place]) Left lumpectomy: IDC with DCIS, 1 cm, margins negative, 1/3 lymph nodes positive, ER 100%, PR 20%, Ki-67 10%, HER-2 negative ratio 1.32, T1bN1 stage II A Mammaprint: Luminal type A, low risk, did not need chemotherapy. Adjuvant radiation therapy from 11/28/2015 to08/18/2017  CurrentTreatment : Adjuvant antiestrogen therapy With anastrozole 1 mg daily started 02/07/16  Anastrozole Toxicities: 1. Intermittent night sweats : she had these before anastrozole 2. occasional hot flashes  3.complaining of weight gain  Surveillance: 1.Breast exam 11/20/2017: Small palpable nodule in the left axilla adjacent to the scar tissue was felt to be benign by mammogram.  She will have another mammogram in October. 2.Mammogram  10/16/2018: Benign breast density category C  Weight gain issues: lost 6 lbs.  Return to clinic in 1 year for follow-up    I discussed the assessment and treatment plan with the patient. The patient was provided an opportunity to ask questions and all were answered. The patient agreed with the plan and demonstrated an understanding of the instructions. The patient was advised to call back or seek an in-person evaluation if the symptoms worsen or if the condition fails to improve as anticipated.   I provided 15 minutes of face-to-face Doximity time during this encounter.    Rulon Eisenmenger, MD 11/26/2018   I, Molly Dorshimer, am acting as scribe for Nicholas Lose, MD.  I have reviewed the above documentation for accuracy and completeness, and I agree with the above.

## 2018-11-26 ENCOUNTER — Inpatient Hospital Stay: Payer: BC Managed Care – PPO | Attending: Hematology and Oncology | Admitting: Hematology and Oncology

## 2018-11-26 DIAGNOSIS — Z79811 Long term (current) use of aromatase inhibitors: Secondary | ICD-10-CM | POA: Diagnosis not present

## 2018-11-26 DIAGNOSIS — Z170 Estrogen receptor positive status [ER+]: Secondary | ICD-10-CM

## 2018-11-26 DIAGNOSIS — C50412 Malignant neoplasm of upper-outer quadrant of left female breast: Secondary | ICD-10-CM

## 2018-11-26 MED ORDER — ANASTROZOLE 1 MG PO TABS: 1.0000 mg | ORAL_TABLET | Freq: Every day | ORAL | 3 refills | Status: DC

## 2019-01-14 ENCOUNTER — Ambulatory Visit: Payer: BC Managed Care – PPO | Admitting: Radiation Oncology

## 2019-01-23 ENCOUNTER — Telehealth: Payer: Self-pay

## 2019-01-23 NOTE — Telephone Encounter (Signed)
Left detailed VM w COVID screen and back door lab info   

## 2019-01-25 ENCOUNTER — Telehealth: Payer: Self-pay | Admitting: Family Medicine

## 2019-01-25 DIAGNOSIS — Z0000 Encounter for general adult medical examination without abnormal findings: Secondary | ICD-10-CM

## 2019-01-25 NOTE — Telephone Encounter (Signed)
-----   Message from Ellamae Sia sent at 01/19/2019  3:20 PM EDT ----- Regarding: Lab orders for Tuesday, 8.11.20 Patient is scheduled for CPX labs, please order future labs, Thanks , Karna Christmas

## 2019-01-27 ENCOUNTER — Telehealth: Payer: Self-pay

## 2019-01-27 ENCOUNTER — Other Ambulatory Visit (INDEPENDENT_AMBULATORY_CARE_PROVIDER_SITE_OTHER): Payer: BC Managed Care – PPO

## 2019-01-27 ENCOUNTER — Other Ambulatory Visit: Payer: Self-pay

## 2019-01-27 DIAGNOSIS — Z0000 Encounter for general adult medical examination without abnormal findings: Secondary | ICD-10-CM | POA: Diagnosis not present

## 2019-01-27 LAB — TSH: TSH: 1.46 u[IU]/mL (ref 0.35–4.50)

## 2019-01-27 LAB — COMPREHENSIVE METABOLIC PANEL
ALT: 25 U/L (ref 0–35)
AST: 21 U/L (ref 0–37)
Albumin: 4.2 g/dL (ref 3.5–5.2)
Alkaline Phosphatase: 93 U/L (ref 39–117)
BUN: 18 mg/dL (ref 6–23)
CO2: 27 mEq/L (ref 19–32)
Calcium: 9.9 mg/dL (ref 8.4–10.5)
Chloride: 103 mEq/L (ref 96–112)
Creatinine, Ser: 0.75 mg/dL (ref 0.40–1.20)
GFR: 95.44 mL/min (ref >60.00)
Glucose, Bld: 98 mg/dL (ref 70–99)
Potassium: 4.3 mEq/L (ref 3.5–5.1)
Sodium: 139 mEq/L (ref 135–145)
Total Bilirubin: 0.5 mg/dL (ref 0.2–1.2)
Total Protein: 7.6 g/dL (ref 6.0–8.3)

## 2019-01-27 LAB — CBC WITH DIFFERENTIAL/PLATELET
Basophils Absolute: 0 10*3/uL (ref 0.0–0.1)
Basophils Relative: 0.9 % (ref 0.0–3.0)
Eosinophils Absolute: 0.1 10*3/uL (ref 0.0–0.7)
Eosinophils Relative: 1.7 % (ref 0.0–5.0)
HCT: 37.5 % (ref 36.0–46.0)
Hemoglobin: 12.1 g/dL (ref 12.0–15.0)
Lymphocytes Relative: 39.2 % (ref 12.0–46.0)
Lymphs Abs: 1.6 10*3/uL (ref 0.7–4.0)
MCHC: 32.3 g/dL (ref 30.0–36.0)
MCV: 84.3 fl (ref 78.0–100.0)
Monocytes Absolute: 0.3 10*3/uL (ref 0.1–1.0)
Monocytes Relative: 8.6 % (ref 3.0–12.0)
Neutro Abs: 2 10*3/uL (ref 1.4–7.7)
Neutrophils Relative %: 49.6 % (ref 43.0–77.0)
Platelets: 281 10*3/uL (ref 150.0–400.0)
RBC: 4.45 Mil/uL (ref 3.87–5.11)
RDW: 14.7 % (ref 11.5–15.5)
WBC: 4 10*3/uL (ref 4.0–10.5)

## 2019-01-27 LAB — LIPID PANEL
Cholesterol: 210 mg/dL — ABNORMAL HIGH (ref 0–200)
HDL: 75.8 mg/dL (ref >39.00)
LDL Cholesterol: 116 mg/dL — ABNORMAL HIGH (ref 0–99)
NonHDL: 134.09
Total CHOL/HDL Ratio: 3
Triglycerides: 91 mg/dL (ref 0.0–149.0)
VLDL: 18.2 mg/dL (ref 0.0–40.0)

## 2019-01-27 NOTE — Telephone Encounter (Signed)
Pt was for lab work and per lab tab already been collected.

## 2019-01-27 NOTE — Telephone Encounter (Signed)
[place] Night - Client Nonclinical Telephone Record [facility] [place] Night - Client Client Site [place] Physician Loura Pardon - MD Contact Type Call Who Is Calling Patient / Member / Family / Caregiver Caller Name [place] Phone Number [phone] Patient Name Jamie Shaffer Patient DOB 1960-09-17 Call Type Message Only Information Provided Reason for Call Request for General Office Information Initial Comment Caller is at the office for an appointment. Caller forgot her mask. Additional Comment Office hours were provided. Call Closed By: Jaclyn Prime Transaction Date/Time: 01/27/2019 7:25:24 AM (ET)

## 2019-01-30 ENCOUNTER — Other Ambulatory Visit: Payer: Self-pay

## 2019-01-30 ENCOUNTER — Encounter: Payer: Self-pay | Admitting: Radiation Oncology

## 2019-01-30 ENCOUNTER — Encounter: Payer: Self-pay | Admitting: Family Medicine

## 2019-01-30 ENCOUNTER — Ambulatory Visit (INDEPENDENT_AMBULATORY_CARE_PROVIDER_SITE_OTHER): Payer: BC Managed Care – PPO | Admitting: Family Medicine

## 2019-01-30 ENCOUNTER — Ambulatory Visit
Admission: RE | Admit: 2019-01-30 | Discharge: 2019-01-30 | Disposition: A | Discharge status: Home / Self Care | Payer: BC Managed Care – PPO | Source: Ambulatory Visit | Attending: Radiation Oncology | Admitting: Radiation Oncology

## 2019-01-30 VITALS — BP 134/78 | HR 71 | Temp 98.0°F | Ht 63.0 in | Wt 178.0 lb

## 2019-01-30 VITALS — BP 133/85 | HR 72 | Temp 97.8°F | Resp 16 | Wt 179.8 lb

## 2019-01-30 DIAGNOSIS — C50412 Malignant neoplasm of upper-outer quadrant of left female breast: Secondary | ICD-10-CM

## 2019-01-30 DIAGNOSIS — E6609 Other obesity due to excess calories: Secondary | ICD-10-CM | POA: Diagnosis not present

## 2019-01-30 DIAGNOSIS — C773 Secondary and unspecified malignant neoplasm of axilla and upper limb lymph nodes: Secondary | ICD-10-CM

## 2019-01-30 DIAGNOSIS — Z79810 Long term (current) use of selective estrogen receptor modulators (SERMs): Secondary | ICD-10-CM | POA: Insufficient documentation

## 2019-01-30 DIAGNOSIS — Z170 Estrogen receptor positive status [ER+]: Secondary | ICD-10-CM | POA: Diagnosis not present

## 2019-01-30 DIAGNOSIS — Z0000 Encounter for general adult medical examination without abnormal findings: Secondary | ICD-10-CM | POA: Diagnosis not present

## 2019-01-30 DIAGNOSIS — Z923 Personal history of irradiation: Secondary | ICD-10-CM | POA: Diagnosis not present

## 2019-01-30 DIAGNOSIS — Z6832 Body mass index (BMI) 32.0-32.9, adult: Secondary | ICD-10-CM

## 2019-01-30 NOTE — Patient Instructions (Addendum)
Get your flu shot in the fall   If you are interested in the shingrix vaccine- call your ins co.  If covered - call us and we will put you on a wait list   For cholesterol Avoid red meat/ fried foods/ egg yolks/ fatty breakfast meats/ butter, cheese and high fat dairy/ and shellfish    Keep walking  Take care of yourself

## 2019-01-30 NOTE — Assessment & Plan Note (Signed)
Discussed how this problem influences overall health and the risks it imposes  Reviewed plan for weight loss with lower calorie diet (via better food choices and also portion control or program like weight watchers) and exercise building up to or more than 30 minutes 5 days per week including some aerobic activity    

## 2019-01-30 NOTE — Assessment & Plan Note (Signed)
Reviewed health habits including diet and exercise and skin cancer prevention Reviewed appropriate screening tests for age  Also reviewed health mt list, fam hx and immunization status , as well as social and family history   See HPI Labs rev  Reminded to get a flu shot in the fall  Enc her to continue exercise  Labs rev Disc diet to mt good cholesterol

## 2019-01-30 NOTE — Assessment & Plan Note (Signed)
Doing well s/p tx  arimidex - 5 y (may get ext to 7) Continues f/u with onc as planned  Also sees gyn and gets mammograms in april

## 2019-01-30 NOTE — Progress Notes (Signed)
Radiation Oncology Follow up Note  Name: Jamie Shaffer   Date:   01/30/2019 MRN:  [id number] DOB: 1960/09/07    This 58 y.o. female presents to the clinic today for 3-year follow-up status post whole breast radiation to her left breast for stage IIa invasive mammary carcinoma.  REFERRING PROVIDER: Tower, [name], MD  HPI: Patient is a 58 year old female now seen out 3 years having completed whole breast radiation to her left breast for stage IIa invasive mammary carcinoma ER PR positive HER-2 negative.  Seen today in routine follow-up she is doing well.  She specifically denies breast tenderness cough or bone pain..  She is currently in arimadex and tolerating that well.  She is been follow-up with mammograms last one in April showed some calcifications which were stable representing fat necrosis in the left breast.  I have reviewed those mammograms and concur.  COMPLICATIONS OF TREATMENT: none  FOLLOW UP COMPLIANCE: keeps appointments   PHYSICAL EXAM:  BP 133/85 (BP Location: Left Arm, Patient Position: Sitting)   Pulse 72   Temp 97.8 F (36.6 C) (Tympanic)   Resp 16   Wt 179 lb 12.8 oz (81.6 kg)   BMI 32.36 kg/m  Lungs are clear to A&P cardiac examination essentially unremarkable with regular rate and rhythm. No dominant mass or nodularity is noted in either breast in 2 positions examined. Incision is well-healed. No axillary or supraclavicular adenopathy is appreciated. Cosmetic result is excellent.  Well-developed well-nourished patient in NAD. HEENT reveals PERLA, EOMI, discs not visualized.  Oral cavity is clear. No oral mucosal lesions are identified. Neck is clear without evidence of cervical or supraclavicular adenopathy. Lungs are clear to A&P. Cardiac examination is essentially unremarkable with regular rate and rhythm without murmur rub or thrill. Abdomen is benign with no organomegaly or masses noted. Motor sensory and DTR levels are equal and symmetric in the upper  and lower extremities. Cranial nerves II through XII are grossly intact. Proprioception is intact. No peripheral adenopathy or edema is identified. No motor or sensory levels are noted. Crude visual fields are within normal range.  RADIOLOGY RESULTS: Mammograms are reviewed compatible with above-stated findings  PLAN: Present time patient continues to do well 3 years out with no evidence of disease.  I am pleased with her overall progress.  I have asked to see her back in 1 year for follow-up.  She continues on arimadex without side effect.  Patient knows to call with any concerns.  I would like to take this opportunity to thank you for allowing me to participate in the care of your patient.Noreene Filbert, MD

## 2019-01-30 NOTE — Assessment & Plan Note (Signed)
Doing well s/p tx for breast cancer Takes arimidex

## 2019-01-30 NOTE — Progress Notes (Signed)
Subjective:    Patient ID: Jamie Shaffer, female    DOB: [birth year], 58 y.o.   MRN: [id number]  HPI Here for health maintenance exam and to review chronic medical problems    Works with elderly  Is very careful re: the pandemic    Wt Readings from Last 3 Encounters:  01/30/19 178 lb (80.7 kg)  01/30/19 179 lb 12.8 oz (81.6 kg)  02/04/18 178 lb (80.7 kg)  working hard not to gain weight  Walking on a track every day -makes her husband go with her  31.53 kg/m   Pap with gyn last December -normal Still has hot flashes- adds on in the summer  No bleeding   Flu shot -scheduled for October   Mammogram 4/20 Personal h/o breast cancer -doing well with follow up  Just had her onc appt - will f/u in 1 y and then be released  Taking arimidex - makes hot flashes worse (planning 5 y to begin with)  Self breast exam - no changes   Tdap 8/12  Colonoscopy 2/18 - 5 y recall   BP Readings from Last 3 Encounters:  01/30/19 134/78  01/30/19 133/85  02/04/18 124/76    Cholesterol Lab Results  Component Value Date   CHOL 210 (H) 01/27/2019   CHOL 215 (H) 01/16/2018   CHOL 164 01/04/2017   Lab Results  Component Value Date   HDL 75.80 01/27/2019   HDL 84.60 01/16/2018   HDL 65.60 01/04/2017   Lab Results  Component Value Date   LDLCALC 116 (H) 01/27/2019   LDLCALC 116 (H) 01/16/2018   LDLCALC 83 01/04/2017   Lab Results  Component Value Date   TRIG 91.0 01/27/2019   TRIG 76.0 01/16/2018   TRIG 77.0 01/04/2017   Lab Results  Component Value Date   CHOLHDL 3 01/27/2019   CHOLHDL 3 01/16/2018   CHOLHDL 3 01/04/2017   Lab Results  Component Value Date   LDLDIRECT 93.0 02/26/2012   Eats too many fried foods    Other labs  Lab Results  Component Value Date   CREATININE 0.75 01/27/2019   BUN 18 01/27/2019   NA 139 01/27/2019   K 4.3 01/27/2019   CL 103 01/27/2019   CO2 27 01/27/2019   Lab Results  Component Value Date   WBC 4.0 01/27/2019   HGB 12.1 01/27/2019   HCT 37.5 01/27/2019   MCV 84.3 01/27/2019   PLT 281.0 01/27/2019    Lab Results  Component Value Date   TSH 1.46 01/27/2019   Lab Results  Component Value Date   ALT 25 01/27/2019   AST 21 01/27/2019   ALKPHOS 93 01/27/2019   BILITOT 0.5 01/27/2019    PHQ score:0 Mood is good  Patient Active Problem List   Diagnosis Date Noted  . Secondary and unspecified malignant neoplasm of axilla and upper limb lymph nodes ([place]) 01/20/2018  . Breast cancer of upper-outer quadrant of left female breast ([place]) 10/24/2015  . Genetic testing 10/24/2015  . Family history of breast cancer in first degree relative 10/04/2015  . Family history of colon cancer 10/04/2015  . Acid reflux 09/20/2014  . Obesity 09/20/2014  . Menopausal symptoms 01/16/2013  . Routine general medical examination at a health care facility 02/04/2011  . FIBROCYSTIC BREAST DISEASE 10/20/2007  . PANIC DISORDER 10/17/2007   Past Medical History:  Diagnosis Date  . Cancer [facility]) 10-2015   left breast/last radiation tx08/17/ no chemo  . Fibrocystic breast   .  GERD (gastroesophageal reflux disease)   . Panic disorder   . Personal history of radiation therapy   . Post-menopausal    Past Surgical History:  Procedure Laterality Date  . BREAST BIOPSY Left 09/28/2015  . BREAST LUMPECTOMY Left 11/02/2015  . BREAST LUMPECTOMY WITH RADIOACTIVE SEED AND SENTINEL LYMPH NODE BIOPSY Left 11/02/2015   Procedure: BREAST LUMPECTOMY WITH RADIOACTIVE SEED AND SENTINEL LYMPH NODE BIOPSY;  Surgeon: [name], MD;  Location: [place];  Service: General;  Laterality: Left;  . CESAREAN SECTION    . fibroid tumor    . PILONIDAL CYST EXCISION    . VAGINAL HYSTERECTOMY     partial   Social History   Tobacco Use  . Smoking status: Never Smoker  . Smokeless tobacco: Never Used  . Tobacco comment: tried smoking as a teenager, but no continued use  Substance Use Topics  . Alcohol use: No  . Drug  use: No   Family History  Problem Relation Age of Onset  . Hypertension Mother   . Diabetes Mother   . Heart failure Mother 46  . Breast cancer Mother 80       +lump  . Breast cancer Sister        dx. 12s; s/p mastectomy  . Breast cancer Sister 65       s/p lump; negative genetic testing  . Fibroids Sister   . Heart Problems Father        valve replacement  . Kidney failure Father 75  . Prostate cancer Father        dx. later age; did not cause his death  . Breast cancer Sister 5       "cancerous cells"; s/p radiation  . Fibroids Sister   . Fibroids Sister   . Bladder Cancer Brother 23       smoker; had issues for 7-8 yrs before going to the doctor  . Colon cancer Maternal Aunt        dx. 35s  . Heart Problems Maternal Uncle   . Lung cancer Brother        s/p surgery to remove nodule?; not a smoker; dx. 61-62  . Alzheimer's disease Maternal Aunt        d. 36  . Colon cancer Maternal Uncle        dx. in 17s or younger  . Lung cancer Cousin        maternal 1st cousin dx. 13s; smoker  . Prostate cancer Cousin        maternal 1st cousin dx. early 8s  . Melanoma Cousin        maternal 1st cousin dx. 52s  . Breast cancer Cousin 99       maternal 1st cousin   . Kidney cancer Cousin        maternal 1st cousin dx. 62s  . Bladder Cancer Paternal Uncle        dx. late 58s-80s  . Aneurysm Maternal Grandmother        brain; d. 46s  . Colon cancer Maternal Grandfather        d. 40s-50s  . Heart disease Paternal Grandmother        d. 51s  . Heart attack Paternal Grandmother   . Heart disease Paternal Grandfather        d. 20s  . Heart attack Paternal Grandfather   . Breast cancer Cousin        paternal 1st cousin dx. 45s  .  Cancer Cousin        (x2) paternal 1st cousins d. unspecified cancers at unspecified ages   No Known Allergies Current Outpatient Medications on File Prior to Visit  Medication Sig Dispense Refill  . anastrozole (ARIMIDEX) 1 MG tablet Take 1  tablet (1 mg total) by mouth daily. 90 tablet 3  . Calcium Carbonate-Vitamin D (CALTRATE 600+D PO) Take by mouth daily.    . ferrous sulfate 325 (65 FE) MG tablet Take 325 mg by mouth daily with breakfast.    . Multiple Vitamin (MULTIVITAMIN) tablet Take 1 tablet by mouth daily.      Marland Kitchen ALPRAZolam (XANAX) 0.5 MG tablet Take 1 tablet (0.5 mg total) by mouth 2 (two) times daily as needed for anxiety or sleep. (Patient not taking: Reported on 01/30/2019) 10 tablet 0   No current facility-administered medications on file prior to visit.     Review of Systems  Constitutional: Negative for activity change, appetite change, fatigue, fever and unexpected weight change.  HENT: Negative for congestion, ear pain, rhinorrhea, sinus pressure and sore throat.   Eyes: Negative for pain, redness and visual disturbance.  Respiratory: Negative for cough, shortness of breath and wheezing.   Cardiovascular: Negative for chest pain and palpitations.  Gastrointestinal: Negative for abdominal pain, blood in stool, constipation and diarrhea.  Endocrine: Negative for polydipsia and polyuria.  Genitourinary: Negative for dysuria, frequency and urgency.  Musculoskeletal: Negative for arthralgias, back pain and myalgias.  Skin: Negative for pallor and rash.  Allergic/Immunologic: Negative for environmental allergies.  Neurological: Negative for dizziness, syncope and headaches.  Hematological: Negative for adenopathy. Does not bruise/bleed easily.  Psychiatric/Behavioral: Negative for decreased concentration and dysphoric mood. The patient is not nervous/anxious.        Objective:   Physical Exam Constitutional:      General: She is not in acute distress.    Appearance: Normal appearance. She is well-developed. She is obese. She is not ill-appearing.  HENT:     Head: Normocephalic and atraumatic.     Right Ear: Tympanic membrane, ear canal and external ear normal.     Left Ear: Tympanic membrane, ear canal and  external ear normal.     Nose: Nose normal.     Mouth/Throat:     Mouth: Mucous membranes are moist.     Pharynx: Oropharynx is clear. No posterior oropharyngeal erythema.  Eyes:     General: No scleral icterus.       Right eye: No discharge.        Left eye: No discharge.     Conjunctiva/sclera: Conjunctivae normal.     Pupils: Pupils are equal, round, and reactive to light.  Neck:     Musculoskeletal: Normal range of motion and neck supple. No neck rigidity or muscular tenderness.     Thyroid: No thyromegaly.     Vascular: No carotid bruit or JVD.  Cardiovascular:     Rate and Rhythm: Normal rate and regular rhythm.     Pulses: Normal pulses.     Heart sounds: Normal heart sounds. No gallop.   Pulmonary:     Effort: Pulmonary effort is normal. No respiratory distress.     Breath sounds: Normal breath sounds. No wheezing or rales.  Abdominal:     General: Bowel sounds are normal. There is no distension.     Palpations: Abdomen is soft. There is no mass.     Tenderness: There is no abdominal tenderness.     Hernia: No hernia is  present.  Musculoskeletal:        General: No tenderness.     Right lower leg: No edema.     Left lower leg: No edema.  Lymphadenopathy:     Cervical: No cervical adenopathy.  Skin:    General: Skin is warm and dry.     Coloration: Skin is not pale.     Findings: No erythema or rash.     Comments: Few skin tags and lentigines  Neurological:     Mental Status: She is alert.     Cranial Nerves: No cranial nerve deficit.     Motor: No abnormal muscle tone.     Coordination: Coordination normal.     Gait: Gait normal.     Deep Tendon Reflexes: Reflexes are normal and symmetric. Reflexes normal.  Psychiatric:        Mood and Affect: Mood normal.           Assessment & Plan:   Problem List Items Addressed This Visit      Immune and Lymphatic   Secondary and unspecified malignant neoplasm of axilla and upper limb lymph nodes (H[facility])    Doing  well s/p tx for breast cancer Takes arimidex        Other   Routine general medical examination at a health care facility - Primary    Reviewed health habits including diet and exercise and skin cancer prevention Reviewed appropriate screening tests for age  Also reviewed health mt list, fam hx and immunization status , as well as social and family history   See HPI Labs rev  Reminded to get a flu shot in the fall  Enc her to continue exercise  Labs rev Disc diet to mt good cholesterol       Obesity    Discussed how this problem influences overall health and the risks it imposes  Reviewed plan for weight loss with lower calorie diet (via better food choices and also portion control or program like weight watchers) and exercise building up to or more than 30 minutes 5 days per week including some aerobic activity         Breast cancer of upper-outer quadrant of left female breast [facility])    Doing well s/p tx  arimidex - 5 y (may get ext to 7) Continues f/u with onc as planned  Also sees gyn and gets mammograms in april

## 2019-02-11 ENCOUNTER — Ambulatory Visit (INDEPENDENT_AMBULATORY_CARE_PROVIDER_SITE_OTHER): Payer: BC Managed Care – PPO

## 2019-02-11 DIAGNOSIS — Z23 Encounter for immunization: Secondary | ICD-10-CM | POA: Diagnosis not present

## 2019-02-11 NOTE — Progress Notes (Signed)
Per orders of Dr. Glori Bickers, injection of Shingrix #1 given by Kris Mouton. Patient tolerated injection well.  Patient was advised to schedule Shingrix #2 2 months after this injection.

## 2019-04-29 ENCOUNTER — Telehealth: Payer: Self-pay

## 2019-04-29 ENCOUNTER — Ambulatory Visit (INDEPENDENT_AMBULATORY_CARE_PROVIDER_SITE_OTHER): Payer: BC Managed Care – PPO | Admitting: Family Medicine

## 2019-04-29 ENCOUNTER — Encounter: Payer: Self-pay | Admitting: Family Medicine

## 2019-04-29 DIAGNOSIS — Z20828 Contact with and (suspected) exposure to other viral communicable diseases: Secondary | ICD-10-CM

## 2019-04-29 DIAGNOSIS — J069 Acute upper respiratory infection, unspecified: Secondary | ICD-10-CM | POA: Diagnosis not present

## 2019-04-29 NOTE — Telephone Encounter (Signed)
I will talk to her then

## 2019-04-29 NOTE — Assessment & Plan Note (Signed)
Mild symptoms (rhinorrhea and cough)  Possible exp to covid  covid test ordered for tomorrow  tx symptoms otc /rest fluids Will isolate until her test returns and symptoms are improved

## 2019-04-29 NOTE — Telephone Encounter (Signed)
Pt is Education officer, museum for [employer] and pt was around an individual on 04/23/19 that tested positive for covid on 04/28/19. Pt was wearing a mask and social distancing. Pt does not have covid symptoms but pt said she has "the sniffles". Pt does not have head congestion,pt is not blowing her nose and her nose is not running. Pt could not be more descriptive of sniffles. Pt scheduled virtual appt today at 4:15 with Dr Glori Bickers.

## 2019-04-29 NOTE — Patient Instructions (Addendum)
Go to the [facility] tomorrow between 10 am and 3 pm to be tested for covid  Isolate yourself until that test returns  If symptoms worsen let us know Watch your temperature as well  Rest and drink fluids

## 2019-04-29 NOTE — Progress Notes (Signed)
Virtual Visit via Video Note  I connected with Jamie Shaffer on 04/29/19 at  4:15 PM EST by a video enabled telemedicine application and verified that I am speaking with the correct person using two identifiers.  Location: Patient: home Provider: office   Parties involved in encounter  Patient: Jamie Shaffer Treating physician: Loura Pardon MD    I discussed the limitations of evaluation and management by telemedicine and the availability of in person appointments. The patient expressed understanding and agreed to proceed.  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 URI (upper respiratory infection) 04/29/2019  . Secondary and unspecified malignant neoplasm of axilla and upper limb lymph nodes ([place]) 01/20/2018  . Breast cancer of upper-outer quadrant of left female breast ([place]) 10/24/2015  . Genetic testing 10/24/2015  . Family history of breast cancer in first degree relative 10/04/2015  . Family history of colon cancer 10/04/2015  . Acid reflux 09/20/2014  . Obesity 09/20/2014  . Menopausal symptoms 01/16/2013  . Routine general medical examination at a health care facility 02/04/2011  . FIBROCYSTIC BREAST DISEASE 10/20/2007  . PANIC DISORDER 10/17/2007   Past Medical History:  Diagnosis Date  . Cancer [facility]) 10-2015   left breast/last radiation tx08/17/ no  chemo  . Fibrocystic breast   . GERD (gastroesophageal reflux disease)   . Panic disorder   . Personal history of radiation therapy   . Post-menopausal    Past Surgical History:  Procedure Laterality Date  . BREAST BIOPSY Left 09/28/2015  . BREAST LUMPECTOMY Left 11/02/2015  . BREAST LUMPECTOMY WITH RADIOACTIVE SEED AND SENTINEL LYMPH NODE BIOPSY Left 11/02/2015   Procedure: BREAST LUMPECTOMY WITH RADIOACTIVE SEED AND SENTINEL LYMPH NODE BIOPSY;  Surgeon: [name], MD;  Location: [place];  Service: General;  Laterality: Left;  . CESAREAN SECTION    . fibroid tumor    . PILONIDAL CYST EXCISION    . VAGINAL HYSTERECTOMY     partial   Social History   Tobacco Use  . Smoking status: Never Smoker  . Smokeless tobacco: Never Used  . Tobacco comment: tried smoking as a teenager, but no continued use  Substance Use Topics  . Alcohol use: No  . Drug use: No   Family History  Problem Relation Age of Onset  . Hypertension Mother   . Diabetes Mother   . Heart failure Mother 59  . Breast cancer Mother 53       +lump  . Breast cancer Sister        dx. 67s; s/p mastectomy  . Breast cancer Sister 70       s/p lump; negative genetic testing  . Fibroids Sister   . Heart Problems Father  valve replacement  . Kidney failure Father 63  . Prostate cancer Father        dx. later age; did not cause his death  . Breast cancer Sister 96       "cancerous cells"; s/p radiation  . Fibroids Sister   . Fibroids Sister   . Bladder Cancer Brother 44       smoker; had issues for 7-8 yrs before going to the doctor  . Colon cancer Maternal Aunt        dx. 58s  . Heart Problems Maternal Uncle   . Lung cancer Brother        s/p surgery to remove nodule?; not a smoker; dx. 61-62  . Alzheimer's disease Maternal Aunt        d. 19  . Colon cancer Maternal Uncle        dx. in 68s or younger  . Lung cancer Cousin        maternal 1st cousin dx. 26s; smoker  . Prostate  cancer Cousin        maternal 1st cousin dx. early 44s  . Melanoma Cousin        maternal 1st cousin dx. 12s  . Breast cancer Cousin 29       maternal 1st cousin   . Kidney cancer Cousin        maternal 1st cousin dx. 68s  . Bladder Cancer Paternal Uncle        dx. late 54s-80s  . Aneurysm Maternal Grandmother        brain; d. 65s  . Colon cancer Maternal Grandfather        d. 40s-50s  . Heart disease Paternal Grandmother        d. 42s  . Heart attack Paternal Grandmother   . Heart disease Paternal Grandfather        d. 2s  . Heart attack Paternal Grandfather   . Breast cancer Cousin        paternal 1st cousin dx. 61s  . Cancer Cousin        (x2) paternal 1st cousins d. unspecified cancers at unspecified ages   No Known Allergies Current Outpatient Medications on File Prior to Visit  Medication Sig Dispense Refill  . anastrozole (ARIMIDEX) 1 MG tablet Take 1 tablet (1 mg total) by mouth daily. 90 tablet 3  . Calcium Carbonate-Vitamin D (CALTRATE 600+D PO) Take by mouth daily.    . ferrous sulfate 325 (65 FE) MG tablet Take 325 mg by mouth once a week.     . Multiple Vitamin (MULTIVITAMIN) tablet Take 1 tablet by mouth daily.      Marland Kitchen ALPRAZolam (XANAX) 0.5 MG tablet Take 1 tablet (0.5 mg total) by mouth 2 (two) times daily as needed for anxiety or sleep. (Patient not taking: Reported on 01/30/2019) 10 tablet 0   No current facility-administered medications on file prior to visit.      Review of Systems  Constitutional: Negative for chills, fever and malaise/fatigue.  HENT: Negative for congestion, ear pain, sinus pain and sore throat.        Rhinorrhea   Eyes: Negative for blurred vision, discharge and redness.  Respiratory: Positive for cough. Negative for shortness of breath and stridor.   Cardiovascular: Negative for chest pain, palpitations and leg swelling.  Gastrointestinal: Negative for abdominal pain, diarrhea, nausea and vomiting.  Musculoskeletal: Negative for  myalgias.  Skin: Negative for rash.  Neurological: Negative for dizziness and headaches.  Observations/Objective: Patient appears well, in no distress Weight is baseline  No facial swelling or asymmetry Normal voice-not hoarse and no slurred speech No obvious tremor or mobility impairment Moving neck and UEs normally Able to hear the call well  No shortness of breath during interview Pt coughs (dry cough) a few times  Talkative and mentally sharp with no cognitive changes No skin changes on face or neck , no rash or pallor Affect is normal    Assessment and Plan: Problem List Items Addressed This Visit      Respiratory   URI (upper respiratory infection)    Mild symptoms (rhinorrhea and cough)  Possible exp to covid  covid test ordered for tomorrow  tx symptoms otc /rest fluids Will isolate until her test returns and symptoms are improved      Relevant Orders   Novel Coronavirus, NAA ([facility])       Follow Up Instructions: Go to the [facility] tomorrow between 10 am and 3 pm to be tested for covid  Isolate yourself until that test returns  If symptoms worsen let us know Watch your temperature as well  Rest and drink fluids    I discussed the assessment and treatment plan with the patient. The patient was provided an opportunity to ask questions and all were answered. The patient agreed with the plan and demonstrated an understanding of the instructions.   The patient was advised to call back or seek an in-person evaluation if the symptoms worsen or if the condition fails to improve as anticipated.     Loura Pardon, MD

## 2019-04-30 ENCOUNTER — Telehealth: Payer: Self-pay

## 2019-04-30 DIAGNOSIS — Z20828 Contact with and (suspected) exposure to other viral communicable diseases: Secondary | ICD-10-CM | POA: Diagnosis not present

## 2019-04-30 NOTE — Telephone Encounter (Signed)
That is fine. Thanks for letting me know.

## 2019-04-30 NOTE — Telephone Encounter (Signed)
Pt had virtual visit with Dr Glori Bickers on 04/29/19 and pt went to [facility] for covid testing this morning but could not get done due to covid testing not starting today until 12 noon and will test until 3 PM. That is not going to be convenient for pt and she has scheduled an appt with [facility] this afternoon for covid testing. Pt wanted DR Glori Bickers to know and pt is going to ask [facility] To contact Dr Glori Bickers with covid results. Today pt said she feels OK; no fever or diarrhea but pt still has sniffing. Pt request cb if Dr Glori Bickers is not OK with [facility] testing for covid.

## 2019-05-04 ENCOUNTER — Telehealth: Payer: Self-pay

## 2019-05-04 NOTE — Telephone Encounter (Signed)
Letter done and in IN box 

## 2019-05-04 NOTE — Telephone Encounter (Signed)
Pt ended up going to [facility] to get covid test and it was negative. I can see results in Care Everywhere pt said since results are negative and she is sxs free she is requesting Dr. Glori Bickers to write a letter releasing her to go back to work tomorrow 05/05/19, pt said if Dr. Glori Bickers writes the letter she can see it in her Mychart and just print it out. She will need letter before she can return to work tomorrow

## 2019-05-04 NOTE — Telephone Encounter (Signed)
Pt notified letter done, she will print it out on mychart

## 2019-05-04 NOTE — Telephone Encounter (Signed)
Pt left v/m requesting cb about covid test results.

## 2019-06-01 ENCOUNTER — Telehealth: Payer: Self-pay | Admitting: *Deleted

## 2019-06-01 MED ORDER — ALPRAZOLAM 0.5 MG PO TABS: 0.5000 mg | ORAL_TABLET | Freq: Two times a day (BID) | ORAL | 0 refills | Status: DC | PRN

## 2019-06-01 NOTE — Telephone Encounter (Signed)
Patient left a voicemail stating that she is requesting a refill on Xanax because of some anxiety that she is having. Patient stated that she has had a death  In her family from covid and just needs a few to help her with her anxiety. Last refill 08/08/18 #10 Last office visit 04/29/19

## 2019-06-02 NOTE — Telephone Encounter (Signed)
I think I sent it yesterday  Let me know if she did not get it  Thanks

## 2019-06-02 NOTE — Telephone Encounter (Signed)
Best number [phone] Pt calling checking on rx She stated if dr tower could call her in 5 pills   [facility] [address]

## 2019-06-02 NOTE — Telephone Encounter (Signed)
Pt didn't know Rx was sent because pharmacy never called her. Pt will check with pharmacy and call us back if she needs anything

## 2019-07-21 DIAGNOSIS — Z6832 Body mass index (BMI) 32.0-32.9, adult: Secondary | ICD-10-CM | POA: Diagnosis not present

## 2019-07-21 DIAGNOSIS — Z01419 Encounter for gynecological examination (general) (routine) without abnormal findings: Secondary | ICD-10-CM | POA: Diagnosis not present

## 2019-07-21 DIAGNOSIS — Z1231 Encounter for screening mammogram for malignant neoplasm of breast: Secondary | ICD-10-CM | POA: Diagnosis not present

## 2019-09-02 ENCOUNTER — Other Ambulatory Visit: Payer: Self-pay | Admitting: Hematology and Oncology

## 2019-09-02 DIAGNOSIS — Z853 Personal history of malignant neoplasm of breast: Secondary | ICD-10-CM

## 2019-10-19 ENCOUNTER — Ambulatory Visit
Admission: RE | Admit: 2019-10-19 | Discharge: 2019-10-19 | Disposition: A | Discharge status: Home / Self Care | Payer: BC Managed Care – PPO | Source: Ambulatory Visit | Attending: Hematology and Oncology | Admitting: Hematology and Oncology

## 2019-10-19 ENCOUNTER — Other Ambulatory Visit: Payer: Self-pay

## 2019-10-19 DIAGNOSIS — R921 Mammographic calcification found on diagnostic imaging of breast: Secondary | ICD-10-CM | POA: Diagnosis not present

## 2019-10-19 DIAGNOSIS — Z853 Personal history of malignant neoplasm of breast: Secondary | ICD-10-CM

## 2019-11-17 ENCOUNTER — Telehealth: Payer: Self-pay | Admitting: Hematology and Oncology

## 2019-11-17 NOTE — Telephone Encounter (Signed)
Scheduled appt per 6/1 sch message - pt aware of appt  

## 2019-11-20 ENCOUNTER — Ambulatory Visit: Payer: BC Managed Care – PPO | Admitting: Family Medicine

## 2019-11-20 ENCOUNTER — Encounter: Payer: Self-pay | Admitting: Family Medicine

## 2019-11-20 ENCOUNTER — Other Ambulatory Visit: Payer: Self-pay

## 2019-11-20 DIAGNOSIS — R200 Anesthesia of skin: Secondary | ICD-10-CM | POA: Insufficient documentation

## 2019-11-20 DIAGNOSIS — R202 Paresthesia of skin: Secondary | ICD-10-CM | POA: Insufficient documentation

## 2019-11-20 NOTE — Assessment & Plan Note (Signed)
In discrete area of R lateral thigh  Strongly suspect paresthetica meralgia  Discussed that condition/caused by pinched nerve in the groin  She has walked more-disc poss of cross training  Wt loss helps Analgesic if needed inst to call if worse or symptoms change or new symptoms  Expect it to be self limiting but if not also inst to call

## 2019-11-20 NOTE — Patient Instructions (Addendum)
I think you have paraesthetica meralgia  (lateral femoral cutaneous nerve syndrome)   Web MD may have some info on it  Common  Occurs due to a pinched nerve in the groin   Avoid tight belts or waist bands  Change up your exercise a bit   Tylenol or ibuprofen are ok for discomfort if needed   If this worsens or becomes intolerable let us know  This should resolve on it's own

## 2019-11-20 NOTE — Progress Notes (Signed)
Subjective:    Patient ID: Jamie Shaffer, female    DOB: 1961-04-05, 59 y.o.   MRN: [id number]  This visit occurred during the SARS-CoV-2 public health emergency.  Safety protocols were in place, including screening questions prior to the visit, additional usage of staff PPE, and extensive cleaning of exam room while observing appropriate contact time as indicated for disinfecting solutions.    HPI  Pt presents with R leg discomfort and tingling   Wt Readings from Last 3 Encounters:  11/20/19 179 lb 9 oz (81.4 kg)  01/30/19 179 lb 12.8 oz (81.6 kg)  01/30/19 178 lb (80.7 kg)   31.81 kg/m   R leg feels numb and cold /hot  Tingly  Upper outer thigh   No swelling   No back pain  No pain in groin   Has started walking 3 mornings per week  Walks a lot on job   Patient Active Problem List   Diagnosis Date Noted  . Numbness and tingling of right leg 11/20/2019  . URI (upper respiratory infection) 04/29/2019  . Secondary and unspecified malignant neoplasm of axilla and upper limb lymph nodes ([place]) 01/20/2018  . Breast cancer of upper-outer quadrant of left female breast ([place]) 10/24/2015  . Genetic testing 10/24/2015  . Family history of breast cancer in first degree relative 10/04/2015  . Family history of colon cancer 10/04/2015  . Acid reflux 09/20/2014  . Obesity 09/20/2014  . Menopausal symptoms 01/16/2013  . Routine general medical examination at a health care facility 02/04/2011  . FIBROCYSTIC BREAST DISEASE 10/20/2007  . PANIC DISORDER 10/17/2007   Past Medical History:  Diagnosis Date  . Cancer [facility]) 10-2015   left breast/last radiation tx08/17/ no chemo  . Fibrocystic breast   . GERD (gastroesophageal reflux disease)   . Panic disorder   . Personal history of radiation therapy   . Post-menopausal    Past Surgical History:  Procedure Laterality Date  . BREAST BIOPSY Left 09/28/2015  . BREAST LUMPECTOMY Left 11/02/2015  . BREAST LUMPECTOMY WITH  RADIOACTIVE SEED AND SENTINEL LYMPH NODE BIOPSY Left 11/02/2015   Procedure: BREAST LUMPECTOMY WITH RADIOACTIVE SEED AND SENTINEL LYMPH NODE BIOPSY;  Surgeon: [name], MD;  Location: [facility];  Service: General;  Laterality: Left;  . CESAREAN SECTION    . fibroid tumor    . PILONIDAL CYST EXCISION    . VAGINAL HYSTERECTOMY     partial   Social History   Tobacco Use  . Smoking status: Never Smoker  . Smokeless tobacco: Never Used  . Tobacco comment: tried smoking as a teenager, but no continued use  Substance Use Topics  . Alcohol use: No  . Drug use: No   Family History  Problem Relation Age of Onset  . Hypertension Mother   . Diabetes Mother   . Heart failure Mother 81  . Breast cancer Mother 41       +lump  . Breast cancer Sister        dx. 28s; s/p mastectomy  . Breast cancer Sister 4       s/p lump; negative genetic testing  . Fibroids Sister   . Heart Problems Father        valve replacement  . Kidney failure Father 51  . Prostate cancer Father        dx. later age; did not cause his death  . Breast cancer Sister 74       "cancerous cells"; s/p radiation  .  Fibroids Sister   . Fibroids Sister   . Bladder Cancer Brother 79       smoker; had issues for 7-8 yrs before going to the doctor  . Colon cancer Maternal Aunt        dx. 32s  . Heart Problems Maternal Uncle   . Lung cancer Brother        s/p surgery to remove nodule?; not a smoker; dx. 61-62  . Alzheimer's disease Maternal Aunt        d. 57  . Colon cancer Maternal Uncle        dx. in 23s or younger  . Lung cancer Cousin        maternal 1st cousin dx. 53s; smoker  . Prostate cancer Cousin        maternal 1st cousin dx. early 68s  . Melanoma Cousin        maternal 1st cousin dx. 11s  . Breast cancer Cousin 81       maternal 1st cousin   . Kidney cancer Cousin        maternal 1st cousin dx. 51s  . Bladder Cancer Paternal Uncle        dx. late 75s-80s  . Aneurysm Maternal  Grandmother        brain; d. 54s  . Colon cancer Maternal Grandfather        d. 40s-50s  . Heart disease Paternal Grandmother        d. 55s  . Heart attack Paternal Grandmother   . Heart disease Paternal Grandfather        d. 78s  . Heart attack Paternal Grandfather   . Breast cancer Cousin        paternal 1st cousin dx. 25s  . Cancer Cousin        (x2) paternal 1st cousins d. unspecified cancers at unspecified ages   No Known Allergies Current Outpatient Medications on File Prior to Visit  Medication Sig Dispense Refill  . ALPRAZolam (XANAX) 0.5 MG tablet Take 1 tablet (0.5 mg total) by mouth 2 (two) times daily as needed for anxiety or sleep. 10 tablet 0  . anastrozole (ARIMIDEX) 1 MG tablet Take 1 tablet (1 mg total) by mouth daily. 90 tablet 3  . Calcium Carbonate-Vitamin D (CALTRATE 600+D PO) Take by mouth daily.    . ferrous sulfate 325 (65 FE) MG tablet Take 325 mg by mouth once a week.     . Multiple Vitamin (MULTIVITAMIN) tablet Take 1 tablet by mouth daily.       No current facility-administered medications on file prior to visit.     Review of Systems  Constitutional: Negative for activity change, appetite change, fatigue, fever and unexpected weight change.  HENT: Negative for congestion, ear pain, rhinorrhea, sinus pressure and sore throat.   Eyes: Negative for pain, redness and visual disturbance.  Respiratory: Negative for cough, shortness of breath and wheezing.   Cardiovascular: Negative for chest pain and palpitations.  Gastrointestinal: Negative for abdominal pain, blood in stool, constipation and diarrhea.  Endocrine: Negative for polydipsia and polyuria.  Genitourinary: Negative for dysuria, frequency and urgency.  Musculoskeletal: Negative for arthralgias, back pain and myalgias.  Skin: Negative for pallor and rash.  Allergic/Immunologic: Negative for environmental allergies.  Neurological: Positive for numbness. Negative for dizziness, syncope and  headaches.  Hematological: Negative for adenopathy. Does not bruise/bleed easily.  Psychiatric/Behavioral: Negative for decreased concentration and dysphoric mood. The patient is not nervous/anxious.  Objective:   Physical Exam Constitutional:      Appearance: Normal appearance. She is obese.  HENT:     Head: Normocephalic and atraumatic.  Eyes:     Conjunctiva/sclera: Conjunctivae normal.     Pupils: Pupils are equal, round, and reactive to light.  Neck:     Vascular: No carotid bruit.  Cardiovascular:     Rate and Rhythm: Normal rate and regular rhythm.     Pulses: Normal pulses.  Pulmonary:     Effort: Pulmonary effort is normal. No respiratory distress.  Abdominal:     General: Abdomen is flat. Bowel sounds are normal. There is no distension.     Tenderness: There is no abdominal tenderness.  Musculoskeletal:     Cervical back: Normal range of motion. No tenderness.     Right lower leg: No edema.     Left lower leg: No edema.     Comments: No lumbar spine tenderness No trochanteric tenderness Neg SLR bilat  Nl rom of both hips No swelling of any joints No pedal edema  No palpable cords Neg homan's sign  Lymphadenopathy:     Cervical: No cervical adenopathy.  Skin:    Coloration: Skin is not pale.     Findings: No bruising, erythema, lesion or rash.  Neurological:     Mental Status: She is alert.     Sensory: No sensory deficit.     Motor: No weakness.     Coordination: Coordination normal.     Gait: Gait normal.     Deep Tendon Reflexes: Reflexes normal.     Comments: Oval area on R lateral thigh has altered sensation of lt touch and temp but no tenderness or numbness  Psychiatric:        Mood and Affect: Mood normal.           Assessment & Plan:   Problem List Items Addressed This Visit      Other   Numbness and tingling of right leg    In discrete area of R lateral thigh  Strongly suspect paresthetica meralgia  Discussed that  condition/caused by pinched nerve in the groin  She has walked more-disc poss of cross training  Wt loss helps Analgesic if needed inst to call if worse or symptoms change or new symptoms  Expect it to be self limiting but if not also inst to call

## 2019-11-24 NOTE — Progress Notes (Signed)
Patient Care Team: Tower, [name], MD as PCP - General  DIAGNOSIS:    ICD-10-CM   1. Malignant neoplasm of upper-outer quadrant of left breast in female, estrogen receptor positive ([place])  C50.412    Z17.0     SUMMARY OF ONCOLOGIC HISTORY: Oncology History  Breast cancer of upper-outer quadrant of left female breast ([place])  09/28/2015 Initial Diagnosis   Left breast biopsy 2:00 position: IDC with DCIS, grade 1, ER 100%, PR 20%, Ki-67 10%, HER-2 negative ratio 1.32, MRI revealed 7 x 5 x 10 mm in the posterior third left breast, T1 cN0 stage IA clinical stage   10/04/2015 Genetic Testing   There were no deleterious mutations or a variants of uncertain significance (VUSes) identified.  Genes tested: APC, ATM, AXIN2, BARD1, BMPR1A, BRCA1, BRCA2, BRIP1, CDH1, CDK4, CDKN2A, CHEK2, EPCAM, FANCC, MLH1, MSH2 (with MSH2 Exons 1-7 Inversion Analysis), MSH6, MUTYH, NBN, PALB2, PMS2, POLD1, POLE, PTEN, RAD51C, RAD51D, SCG5/GREM1, SMAD4, STK11, TP53, VHL, and XRCC2.   11/02/2015 Surgery   Left lumpectomy ([place]): IDC with DCIS, 1 cm, margins negative, 1/3 lymph nodes positive, ER 100%, PR 20%, Ki-67 10%, HER-2 negative ratio 1.32, T1bN1 stage IIA, Mammaprint luminal-type A, low risk   12/07/2015 - 02/03/2016 Radiation Therapy   Radiation ([place]): Left breast/left supraclav: 50.4Gy, 28 fractions, left axilla: 12.32 Gy, 7 fractions, left breast boost: 14.4 Gy in 8 fractions.     02/07/2016 -  Anti-estrogen oral therapy   Anastrozole 20m daily, goal 5 years of therapy     CHIEF COMPLIANT: Follow-up of left breast cancer on anastrozole therapy  INTERVAL HISTORY: Jamie Shaffer a 59y.o. with above-mentioned history of left breast cancer treated with lumpectomy, radiation, and who is currently on anastrozole. Mammogram on 10/16/18 showed no evidence of malignancy, and stable calcifications at the lumpectomy site. She presents to the clinic today for annual follow-up.   She has completed 4 years  of antiestrogen therapy and appears to be tolerating anastrozole fairly well.  She continues to have hot flashes which appear to be worse in the summer.  She has mild discomfort in the breast if someone falls on her chest.  She has a patch of numbness on the right thigh on the lateral aspect which cannot be explained by any of her treatments.  Is not bothering her too significantly.  ALLERGIES:  has No Known Allergies.  MEDICATIONS:  Current Outpatient Medications  Medication Sig Dispense Refill   ALPRAZolam (XANAX) 0.5 MG tablet Take 1 tablet (0.5 mg total) by mouth 2 (two) times daily as needed for anxiety or sleep. 10 tablet 0   anastrozole (ARIMIDEX) 1 MG tablet Take 1 tablet (1 mg total) by mouth daily. 90 tablet 3   Calcium Carbonate-Vitamin D (CALTRATE 600+D PO) Take by mouth daily.     ferrous sulfate 325 (65 FE) MG tablet Take 325 mg by mouth once a week.      Multiple Vitamin (MULTIVITAMIN) tablet Take 1 tablet by mouth daily.       No current facility-administered medications for this visit.    PHYSICAL EXAMINATION: ECOG PERFORMANCE STATUS: 1 - Symptomatic but completely ambulatory  There were no vitals filed for this visit. There were no vitals filed for this visit.  BREAST: No palpable masses or nodules in either right or left breasts. No palpable axillary supraclavicular or infraclavicular adenopathy no breast tenderness or nipple discharge. (exam performed in the presence of a chaperone)  LABORATORY DATA:  I have reviewed the  data as listed CMP Latest Ref Rng & Units 01/27/2019 01/16/2018 01/04/2017  Glucose 70 - 99 mg/dL 98 99 94  BUN 6 - 23 mg/dL '18 17 15  ' Creatinine 0.40 - 1.20 mg/dL 0.75 0.74 0.74  Sodium 135 - 145 mEq/L 139 140 140  Potassium 3.5 - 5.1 mEq/L 4.3 4.2 4.0  Chloride 96 - 112 mEq/L 103 104 106  CO2 19 - 32 mEq/L '27 30 30  ' Calcium 8.4 - 10.5 mg/dL 9.9 10.0 9.6  Total Protein 6.0 - 8.3 g/dL 7.6 7.6 6.9  Total Bilirubin 0.2 - 1.2 mg/dL 0.5 0.5 0.5    Alkaline Phos 39 - 117 U/L 93 103 62  AST 0 - 37 U/L '21 21 18  ' ALT 0 - 35 U/L '25 30 16    ' Lab Results  Component Value Date   WBC 4.0 01/27/2019   HGB 12.1 01/27/2019   HCT 37.5 01/27/2019   MCV 84.3 01/27/2019   PLT 281.0 01/27/2019   NEUTROABS 2.0 01/27/2019    ASSESSMENT & PLAN:  Breast cancer of upper-outer quadrant of left female breast ([place]) Left lumpectomy: IDC with DCIS, 1 cm, margins negative, 1/3 lymph nodes positive, ER 100%, PR 20%, Ki-67 10%, HER-2 negative ratio 1.32, T1bN1 stage II A Mammaprint: Luminal type A, low risk, did not need chemotherapy. Adjuvant radiation therapy from 11/28/2015 to08/18/2017  CurrentTreatment: Adjuvant antiestrogen therapy With anastrozole 1 mg daily started 02/07/16  Anastrozole Toxicities: 1. Intermittent night sweats : she had these before anastrozole 2. occasional hot flashes  3.complaining of weight gain  Surveillance: 1.Breast exam6/9/21:Benign  2.Mammogram5/3/21: Benign appearing calcs at lumpectomy site,  breast density category C  We discussed the difference between 5 years versus 7 years of antiestrogen therapy.  Patient has extensive menopausal symptoms like vaginal dryness skin dryness hot flashes and is looking forward to finishing 5 years and stopping it.  We will discuss this once again next year.  Patient is very paranoid and anxious about breast cancer recurrence as well and she might decide to continue on for couple more years.  Return to clinic in 1 year for follow-up    No orders of the defined types were placed in this encounter.  The patient has a good understanding of the overall plan. she agrees with it. she will call with any problems that may develop before the next visit here.  Total time spent: 20 mins including face to face time and time spent for planning, charting and coordination of care  Nicholas Lose, MD 11/25/2019  I, Jamie Shaffer, am acting as scribe for Dr. Nicholas Lose.  I  have reviewed the above documentation for accuracy and completeness, and I agree with the above.

## 2019-11-25 ENCOUNTER — Other Ambulatory Visit: Payer: Self-pay

## 2019-11-25 ENCOUNTER — Inpatient Hospital Stay: Payer: BC Managed Care – PPO | Attending: Hematology and Oncology | Admitting: Hematology and Oncology

## 2019-11-25 DIAGNOSIS — R635 Abnormal weight gain: Secondary | ICD-10-CM | POA: Insufficient documentation

## 2019-11-25 DIAGNOSIS — R200 Anesthesia of skin: Secondary | ICD-10-CM | POA: Insufficient documentation

## 2019-11-25 DIAGNOSIS — N951 Menopausal and female climacteric states: Secondary | ICD-10-CM | POA: Insufficient documentation

## 2019-11-25 DIAGNOSIS — Z170 Estrogen receptor positive status [ER+]: Secondary | ICD-10-CM

## 2019-11-25 DIAGNOSIS — Z79811 Long term (current) use of aromatase inhibitors: Secondary | ICD-10-CM | POA: Insufficient documentation

## 2019-11-25 DIAGNOSIS — C50412 Malignant neoplasm of upper-outer quadrant of left female breast: Secondary | ICD-10-CM | POA: Diagnosis not present

## 2019-11-25 MED ORDER — ANASTROZOLE 1 MG PO TABS: 1.0000 mg | ORAL_TABLET | Freq: Every day | ORAL | 3 refills | Status: DC

## 2019-11-25 NOTE — Assessment & Plan Note (Addendum)
Left lumpectomy: IDC with DCIS, 1 cm, margins negative, 1/3 lymph nodes positive, ER 100%, PR 20%, Ki-67 10%, HER-2 negative ratio 1.32, T1bN1 stage II A Mammaprint: Luminal type A, low risk, did not need chemotherapy. Adjuvant radiation therapy from 11/28/2015 to08/18/2017  CurrentTreatment: Adjuvant antiestrogen therapy With anastrozole 1 mg daily started 02/07/16  Anastrozole Toxicities: 1. Intermittent night sweats : she had these before anastrozole 2. occasional hot flashes  3.complaining of weight gain  Surveillance: 1.Breast exam6/9/21:Benign  2.Mammogram5/3/21: Benign appearing calcs at lumpectomy site,  breast density category C  Weight gain issues:lost 6 lbs.  Return to clinic in 1 year for follow-up

## 2019-11-30 ENCOUNTER — Telehealth: Payer: Self-pay | Admitting: Hematology and Oncology

## 2019-11-30 NOTE — Telephone Encounter (Signed)
Scheduled per los, patient has been called and notified. 

## 2019-12-07 ENCOUNTER — Telehealth: Payer: Self-pay | Admitting: *Deleted

## 2019-12-07 DIAGNOSIS — R200 Anesthesia of skin: Secondary | ICD-10-CM

## 2019-12-07 NOTE — Telephone Encounter (Signed)
Pt notified referral done and [facility] will call to schedule appt

## 2019-12-07 NOTE — Telephone Encounter (Signed)
Patient left a voicemail requesting a call back about the referral request that she made earlier today.

## 2019-12-07 NOTE — Telephone Encounter (Signed)
Referral done Will route to [facility]  

## 2019-12-07 NOTE — Telephone Encounter (Signed)
Patient left a voicemail stating that she saw Dr. Glori Bickers a couple of weeks ago regarding her leg.. Patient stated that she was to call back and let Dr. Glori Bickers know how she was doing. Patient stated that she is still having problems and pain in her leg. Patient stated that she would like a referral and would prefer a doctor in [place] because that is close to her work.

## 2019-12-08 NOTE — Telephone Encounter (Signed)
Referral sent to [facility] , they will call the patient to schedule

## 2019-12-17 DIAGNOSIS — M76891 Other specified enthesopathies of right lower limb, excluding foot: Secondary | ICD-10-CM | POA: Diagnosis not present

## 2019-12-17 DIAGNOSIS — M79651 Pain in right thigh: Secondary | ICD-10-CM | POA: Diagnosis not present

## 2019-12-17 DIAGNOSIS — G5711 Meralgia paresthetica, right lower limb: Secondary | ICD-10-CM | POA: Diagnosis not present

## 2019-12-22 DIAGNOSIS — Z20822 Contact with and (suspected) exposure to covid-19: Secondary | ICD-10-CM | POA: Diagnosis not present

## 2020-01-27 ENCOUNTER — Other Ambulatory Visit: Payer: Self-pay

## 2020-01-27 ENCOUNTER — Encounter: Payer: Self-pay | Admitting: Radiation Oncology

## 2020-01-27 ENCOUNTER — Ambulatory Visit
Admission: RE | Admit: 2020-01-27 | Discharge: 2020-01-27 | Disposition: A | Discharge status: Home / Self Care | Payer: BC Managed Care – PPO | Source: Ambulatory Visit | Attending: Radiation Oncology | Admitting: Radiation Oncology

## 2020-01-27 VITALS — BP 135/82 | HR 72 | Temp 96.9°F | Wt 178.0 lb

## 2020-01-27 DIAGNOSIS — Z79811 Long term (current) use of aromatase inhibitors: Secondary | ICD-10-CM | POA: Diagnosis not present

## 2020-01-27 DIAGNOSIS — Z170 Estrogen receptor positive status [ER+]: Secondary | ICD-10-CM | POA: Diagnosis not present

## 2020-01-27 DIAGNOSIS — C50412 Malignant neoplasm of upper-outer quadrant of left female breast: Secondary | ICD-10-CM | POA: Diagnosis not present

## 2020-01-27 DIAGNOSIS — Z923 Personal history of irradiation: Secondary | ICD-10-CM | POA: Insufficient documentation

## 2020-01-27 NOTE — Progress Notes (Signed)
Radiation Oncology Follow up Note  Name: Jamie Shaffer   Date:   01/27/2020 MRN:  [id number] DOB: August 30, 1960    This 59 y.o. female presents to the clinic today for 4-year follow-up status post whole breast radiation to her left breast for stage IIa invasive mammary carcinoma.  REFERRING PROVIDER: Tower, [name], MD  HPI: Patient is a 59 year old female now out over 4 years having completed whole breast radiation to her left breast for stage IIa invasive mammary carcinoma ER/PR positive HER-2/neu negative.  Seen today in routine follow-up she is doing well.  She specifically denies breast tenderness cough or bone pain.  She is currently on.  Arimidex tolerant well without side effect.  She had mammograms back in May which I have reviewed were BI-RADS 2 benign.  COMPLICATIONS OF TREATMENT: none  FOLLOW UP COMPLIANCE: keeps appointments   PHYSICAL EXAM:  BP 135/82 (BP Location: Right Arm, Patient Position: Sitting, Cuff Size: Normal)   Pulse 72   Temp (!) 96.9 F (36.1 C)   Wt 178 lb (80.7 kg)   BMI 31.53 kg/m  Lungs are clear to A&P cardiac examination essentially unremarkable with regular rate and rhythm. No dominant mass or nodularity is noted in either breast in 2 positions examined. Incision is well-healed. No axillary or supraclavicular adenopathy is appreciated. Cosmetic result is excellent.  Well-developed well-nourished patient in NAD. HEENT reveals PERLA, EOMI, discs not visualized.  Oral cavity is clear. No oral mucosal lesions are identified. Neck is clear without evidence of cervical or supraclavicular adenopathy. Lungs are clear to A&P. Cardiac examination is essentially unremarkable with regular rate and rhythm without murmur rub or thrill. Abdomen is benign with no organomegaly or masses noted. Motor sensory and DTR levels are equal and symmetric in the upper and lower extremities. Cranial nerves II through XII are grossly intact. Proprioception is intact. No  peripheral adenopathy or edema is identified. No motor or sensory levels are noted. Crude visual fields are within normal range.  RADIOLOGY RESULTS: Mammograms reviewed compatible with above-stated findings  PLAN: Present time patient is doing well out over 4 years with no evidence of disease.  I am pleased with her overall progress.  At this time I am going to discontinue follow-up care.  Patient knows to call at anytime with any concerns she continues on Arimidex and with yearly follow-up mammograms.  Patient knows to call with any concerns.  I would like to take this opportunity to thank you for allowing me to participate in the care of your patient.Noreene Filbert, MD

## 2020-02-29 ENCOUNTER — Other Ambulatory Visit: Payer: Self-pay | Admitting: *Deleted

## 2020-02-29 MED ORDER — ALPRAZOLAM 0.5 MG PO TABS: 0.5000 mg | ORAL_TABLET | Freq: Two times a day (BID) | ORAL | 0 refills | Status: DC | PRN

## 2020-02-29 NOTE — Telephone Encounter (Signed)
Patient left a voicemail requesting a refill on Alprazolam. Patient stated that she has had a death in her family and is having a little anxiety. Patient want's to know if Dr. Glori Bickers will given her 5-10 pills to help her get thru this?  Last refill 06/01/19 #10 Last office visit 6/48/21 Pharmacy [facility]

## 2020-03-07 ENCOUNTER — Encounter: Payer: Self-pay | Admitting: Hematology and Oncology

## 2020-07-29 DIAGNOSIS — Z6831 Body mass index (BMI) 31.0-31.9, adult: Secondary | ICD-10-CM | POA: Diagnosis not present

## 2020-07-29 DIAGNOSIS — Z01419 Encounter for gynecological examination (general) (routine) without abnormal findings: Secondary | ICD-10-CM | POA: Diagnosis not present

## 2020-08-05 LAB — HM PAP SMEAR

## 2020-08-25 ENCOUNTER — Telehealth: Payer: Self-pay | Admitting: Family Medicine

## 2020-08-25 DIAGNOSIS — Z0000 Encounter for general adult medical examination without abnormal findings: Secondary | ICD-10-CM

## 2020-08-25 NOTE — Telephone Encounter (Signed)
-----   Message from Ellamae Sia sent at 08/08/2020 11:01 AM EST ----- Regarding: Lab orders for Friday, 3.11.22 Patient is scheduled for CPX labs, please order future labs, Thanks , Karna Christmas

## 2020-08-26 ENCOUNTER — Other Ambulatory Visit: Payer: Self-pay | Admitting: Hematology and Oncology

## 2020-08-26 ENCOUNTER — Other Ambulatory Visit (INDEPENDENT_AMBULATORY_CARE_PROVIDER_SITE_OTHER): Payer: BC Managed Care – PPO

## 2020-08-26 ENCOUNTER — Other Ambulatory Visit: Payer: Self-pay

## 2020-08-26 DIAGNOSIS — Z0000 Encounter for general adult medical examination without abnormal findings: Secondary | ICD-10-CM | POA: Diagnosis not present

## 2020-08-26 DIAGNOSIS — Z9889 Other specified postprocedural states: Secondary | ICD-10-CM

## 2020-08-26 LAB — CBC WITH DIFFERENTIAL/PLATELET
Basophils Absolute: 0 10*3/uL (ref 0.0–0.1)
Basophils Relative: 0.8 % (ref 0.0–3.0)
Eosinophils Absolute: 0.1 10*3/uL (ref 0.0–0.7)
Eosinophils Relative: 2 % (ref 0.0–5.0)
HCT: 36.7 % (ref 36.0–46.0)
Hemoglobin: 11.8 g/dL — ABNORMAL LOW (ref 12.0–15.0)
Lymphocytes Relative: 45.8 % (ref 12.0–46.0)
Lymphs Abs: 1.7 10*3/uL (ref 0.7–4.0)
MCHC: 32.2 g/dL (ref 30.0–36.0)
MCV: 85 fl (ref 78.0–100.0)
Monocytes Absolute: 0.4 10*3/uL (ref 0.1–1.0)
Monocytes Relative: 10 % (ref 3.0–12.0)
Neutro Abs: 1.5 10*3/uL (ref 1.4–7.7)
Neutrophils Relative %: 41.4 % — ABNORMAL LOW (ref 43.0–77.0)
Platelets: 287 10*3/uL (ref 150.0–400.0)
RBC: 4.32 Mil/uL (ref 3.87–5.11)
RDW: 14.7 % (ref 11.5–15.5)
WBC: 3.6 10*3/uL — ABNORMAL LOW (ref 4.0–10.5)

## 2020-08-26 LAB — COMPREHENSIVE METABOLIC PANEL
ALT: 31 U/L (ref 0–35)
AST: 21 U/L (ref 0–37)
Albumin: 3.8 g/dL (ref 3.5–5.2)
Alkaline Phosphatase: 100 U/L (ref 39–117)
BUN: 18 mg/dL (ref 6–23)
CO2: 28 mEq/L (ref 19–32)
Calcium: 9.7 mg/dL (ref 8.4–10.5)
Chloride: 105 mEq/L (ref 96–112)
Creatinine, Ser: 0.76 mg/dL (ref 0.40–1.20)
GFR: 84.59 mL/min (ref >60.00)
Glucose, Bld: 94 mg/dL (ref 70–99)
Potassium: 4.3 mEq/L (ref 3.5–5.1)
Sodium: 140 mEq/L (ref 135–145)
Total Bilirubin: 0.5 mg/dL (ref 0.2–1.2)
Total Protein: 7 g/dL (ref 6.0–8.3)

## 2020-08-26 LAB — LIPID PANEL
Cholesterol: 215 mg/dL — ABNORMAL HIGH (ref 0–200)
HDL: 79.4 mg/dL (ref >39.00)
LDL Cholesterol: 122 mg/dL — ABNORMAL HIGH (ref 0–99)
NonHDL: 136.04
Total CHOL/HDL Ratio: 3
Triglycerides: 70 mg/dL (ref 0.0–149.0)
VLDL: 14 mg/dL (ref 0.0–40.0)

## 2020-08-26 LAB — TSH: TSH: 1.43 u[IU]/mL (ref 0.35–4.50)

## 2020-08-26 NOTE — Addendum Note (Signed)
Addended by: Ellamae Sia on: 08/26/2020 07:43 AM   Modules accepted: Orders

## 2020-09-02 ENCOUNTER — Encounter: Payer: BC Managed Care – PPO | Admitting: Family Medicine

## 2020-09-02 ENCOUNTER — Telehealth: Payer: Self-pay

## 2020-09-02 MED ORDER — POLYSACCHARIDE IRON COMPLEX 150 MG PO CAPS: 150.0000 mg | ORAL_CAPSULE | Freq: Every day | ORAL | 0 refills | Status: DC

## 2020-09-02 NOTE — Telephone Encounter (Signed)
Pt had appt for 09/02/20 and changed to 09/20/20 for CPX with Dr Glori Bickers; pt said she is very tired and no energy since last Friday and concerned iron level is low and wants iron med sent to [place]. Pt said  hgb was 11.8. pt has not seen any bleeding.No H/a or dizziness. Pt request cb after Dr Glori Bickers reviews this note with her instructions.

## 2020-09-02 NOTE — Telephone Encounter (Signed)
Please send in niferex 150 mg 1 po daily #90 no ref. She may be able to get this otc also

## 2020-09-02 NOTE — Telephone Encounter (Signed)
Pt notified Rx sent to pharmacy and advised of PCP's comments 

## 2020-09-20 ENCOUNTER — Encounter: Payer: Self-pay | Admitting: Family Medicine

## 2020-09-20 ENCOUNTER — Ambulatory Visit (INDEPENDENT_AMBULATORY_CARE_PROVIDER_SITE_OTHER): Payer: BC Managed Care – PPO | Admitting: Family Medicine

## 2020-09-20 ENCOUNTER — Other Ambulatory Visit: Payer: Self-pay

## 2020-09-20 VITALS — BP 124/76 | HR 68 | Temp 97.0°F | Ht 62.5 in | Wt 175.3 lb

## 2020-09-20 DIAGNOSIS — E611 Iron deficiency: Secondary | ICD-10-CM | POA: Diagnosis not present

## 2020-09-20 DIAGNOSIS — Z853 Personal history of malignant neoplasm of breast: Secondary | ICD-10-CM | POA: Insufficient documentation

## 2020-09-20 DIAGNOSIS — Z0000 Encounter for general adult medical examination without abnormal findings: Secondary | ICD-10-CM | POA: Diagnosis not present

## 2020-09-20 DIAGNOSIS — Z800 Family history of malignant neoplasm of digestive organs: Secondary | ICD-10-CM

## 2020-09-20 DIAGNOSIS — Z6831 Body mass index (BMI) 31.0-31.9, adult: Secondary | ICD-10-CM

## 2020-09-20 DIAGNOSIS — E6609 Other obesity due to excess calories: Secondary | ICD-10-CM

## 2020-09-20 MED ORDER — POLYSACCHARIDE IRON COMPLEX 150 MG PO CAPS: 150.0000 mg | ORAL_CAPSULE | Freq: Every day | ORAL | 3 refills | Status: DC

## 2020-09-20 NOTE — Assessment & Plan Note (Signed)
utd oncology f/u  May be able to come off arimidex at next visit Suspect this causes fatiigue and pain Gyn is following bone density

## 2020-09-20 NOTE — Patient Instructions (Addendum)
For weight loss eat more lean protein and product Try to get most of your carbohydrates from produce (with the exception of white potatoes)  Eat less bread/pasta/rice/snack foods/cereals/sweets and other items from the middle of the grocery store (processed carbs)  Keep exercising   For cholesterol  Avoid red meat/ fried foods/ egg yolks/ fatty breakfast meats/ butter, cheese and high fat dairy/ and shellfish    I placed a referral to the healthy weight and wellness clinic The office will call you

## 2020-09-20 NOTE — Assessment & Plan Note (Signed)
Last colonoscopy 2018 with 5 y recall  Pt is aware

## 2020-09-20 NOTE — Assessment & Plan Note (Signed)
Taking niferex 150 mg daily  Stable cbc Some fatigue/unsure if related

## 2020-09-20 NOTE — Assessment & Plan Note (Signed)
Reviewed health habits including diet and exercise and skin cancer prevention Reviewed appropriate screening tests for age  Also reviewed health mt list, fam hx and immunization status , as well as social and family history   See HPI Labs reviewed  Referral made to healthy weight and wellness clinic Commended good habits utd mammogram and breast cancer f/u  Sent for pap and dexa report from gyn  Colonoscopy 2018 with 5 y recall  No changes in family history

## 2020-09-20 NOTE — Assessment & Plan Note (Signed)
Discussed how this problem influences overall health and the risks it imposes  Reviewed plan for weight loss with lower calorie diet (via better food choices and also portion control or program like weight watchers) and exercise building up to or more than 30 minutes 5 days per week including some aerobic activity   [facility] for eval and treatment Good effort so far

## 2020-09-20 NOTE — Progress Notes (Signed)
Subjective:    Patient ID: Jamie Shaffer, female    DOB: 1961-02-05, 60 y.o.   MRN: [id number]  This visit occurred during the SARS-CoV-2 public health emergency.  Safety protocols were in place, including screening questions prior to the visit, additional usage of staff PPE, and extensive cleaning of exam room while observing appropriate contact time as indicated for disinfecting solutions.    HPI Here for health maintenance exam and to review chronic medical problems    Wt Readings from Last 3 Encounters:  09/20/20 175 lb 5 oz (79.5 kg)  01/27/20 178 lb (80.7 kg)  11/25/19 176 lb (79.8 kg)   31.55 kg/m  Trying to loose weight  Is interested in programs  [facility]  Breast cancer medication makes it harder to loose weight   Interested in ref to healthy weight clinic  Flu shot 10/21 covid immunized plus booster Tdap 8/12 shingrix 8/20  Mammogram 5/21- due in may , if normal may get to stop the anastrozole Self breast exam - no lumps  Personal h/o breast cancer , still taking anastrozole Breast cancer in multiple sisters  dexa - had one several years ago (not in chart)  No bone loss  No falls or broken bones  Takes caltrate   Pap 12/19-neg with gyn -had last visit in feb -thinks she had a pap   colonoscopy 2/18 with 5 y recall  Several relatives with colon cancer (distant) She has had genetic testing -favorable  BP Readings from Last 3 Encounters:  09/20/20 124/76  01/27/20 135/82  11/25/19 (!) 143/74   Pulse Readings from Last 3 Encounters:  09/20/20 68  01/27/20 72  11/25/19 74     Very mild anemia Takes iron supplementation  Lab Results  Component Value Date   WBC 3.6 (L) 08/26/2020   HGB 11.8 (L) 08/26/2020   HCT 36.7 08/26/2020   MCV 85.0 08/26/2020   PLT 287.0 08/26/2020  some fatigue   Cholesterol Lab Results  Component Value Date   CHOL 215 (H) 08/26/2020   CHOL 210 (H) 01/27/2019   CHOL 215 (H) 01/16/2018    Lab Results  Component Value Date   HDL 79.40 08/26/2020   HDL 75.80 01/27/2019   HDL 84.60 01/16/2018   Lab Results  Component Value Date   LDLCALC 122 (H) 08/26/2020   LDLCALC 116 (H) 01/27/2019   LDLCALC 116 (H) 01/16/2018   Lab Results  Component Value Date   TRIG 70.0 08/26/2020   TRIG 91.0 01/27/2019   TRIG 76.0 01/16/2018   Lab Results  Component Value Date   CHOLHDL 3 08/26/2020   CHOLHDL 3 01/27/2019   CHOLHDL 3 01/16/2018   Lab Results  Component Value Date   LDLDIRECT 93.0 02/26/2012   some fried fish  occ corn chips and potato chips  occ bacon  Some shellfish   Lab Results  Component Value Date   TSH 1.43 08/26/2020   Lab Results  Component Value Date   CREATININE 0.76 08/26/2020   BUN 18 08/26/2020   NA 140 08/26/2020   K 4.3 08/26/2020   CL 105 08/26/2020   CO2 28 08/26/2020   Lab Results  Component Value Date   ALT 31 08/26/2020   AST 21 08/26/2020   ALKPHOS 100 08/26/2020   BILITOT 0.5 08/26/2020   Patient Active Problem List   Diagnosis Date Noted  . Iron deficiency 09/20/2020  . Numbness and tingling of right leg 11/20/2019  . Breast  cancer of upper-outer quadrant of left female breast ([place]) 10/24/2015  . Genetic testing 10/24/2015  . Family history of breast cancer in first degree relative 10/04/2015  . Family history of colon cancer 10/04/2015  . Acid reflux 09/20/2014  . Obesity 09/20/2014  . Menopausal symptoms 01/16/2013  . Routine general medical examination at a health care facility 02/04/2011  . FIBROCYSTIC BREAST DISEASE 10/20/2007  . PANIC DISORDER 10/17/2007   Past Medical History:  Diagnosis Date  . Cancer [facility]) 10-2015   left breast/last radiation tx08/17/ no chemo  . Fibrocystic breast   . GERD (gastroesophageal reflux disease)   . Panic disorder   . Personal history of radiation therapy   . Post-menopausal    Past Surgical History:  Procedure Laterality Date  . BREAST BIOPSY Left 09/28/2015  . BREAST  LUMPECTOMY Left 11/02/2015  . BREAST LUMPECTOMY WITH RADIOACTIVE SEED AND SENTINEL LYMPH NODE BIOPSY Left 11/02/2015   Procedure: BREAST LUMPECTOMY WITH RADIOACTIVE SEED AND SENTINEL LYMPH NODE BIOPSY;  Surgeon: [name], MD;  Location: [facility];  Service: General;  Laterality: Left;  . CESAREAN SECTION    . fibroid tumor    . PILONIDAL CYST EXCISION    . VAGINAL HYSTERECTOMY     partial   Social History   Tobacco Use  . Smoking status: Never Smoker  . Smokeless tobacco: Never Used  . Tobacco comment: tried smoking as a teenager, but no continued use  Substance Use Topics  . Alcohol use: No  . Drug use: No   Family History  Problem Relation Age of Onset  . Hypertension Mother   . Diabetes Mother   . Heart failure Mother 29  . Breast cancer Mother 18       +lump  . Breast cancer Sister        dx. 5s; s/p mastectomy  . Breast cancer Sister 35       s/p lump; negative genetic testing  . Fibroids Sister   . Heart Problems Father        valve replacement  . Kidney failure Father 68  . Prostate cancer Father        dx. later age; did not cause his death  . Breast cancer Sister 79       "cancerous cells"; s/p radiation  . Fibroids Sister   . Fibroids Sister   . Bladder Cancer Brother 25       smoker; had issues for 7-8 yrs before going to the doctor  . Colon cancer Maternal Aunt        dx. 51s  . Heart Problems Maternal Uncle   . Lung cancer Brother        s/p surgery to remove nodule?; not a smoker; dx. 61-62  . Alzheimer's disease Maternal Aunt        d. 44  . Colon cancer Maternal Uncle        dx. in 55s or younger  . Lung cancer Cousin        maternal 1st cousin dx. 2s; smoker  . Prostate cancer Cousin        maternal 1st cousin dx. early 65s  . Melanoma Cousin        maternal 1st cousin dx. 38s  . Breast cancer Cousin 18       maternal 1st cousin   . Kidney cancer Cousin        maternal 1st cousin dx. 44s  . Bladder Cancer Paternal  Uncle  dx. late 70s-80s  . Aneurysm Maternal Grandmother        brain; d. 76s  . Colon cancer Maternal Grandfather        d. 40s-50s  . Heart disease Paternal Grandmother        d. 66s  . Heart attack Paternal Grandmother   . Heart disease Paternal Grandfather        d. 46s  . Heart attack Paternal Grandfather   . Breast cancer Cousin        paternal 1st cousin dx. 1s  . Cancer Cousin        (x2) paternal 1st cousins d. unspecified cancers at unspecified ages   No Known Allergies Current Outpatient Medications on File Prior to Visit  Medication Sig Dispense Refill  . ALPRAZolam (XANAX) 0.5 MG tablet Take 1 tablet (0.5 mg total) by mouth 2 (two) times daily as needed for anxiety or sleep. 10 tablet 0  . anastrozole (ARIMIDEX) 1 MG tablet Take 1 tablet (1 mg total) by mouth daily. 90 tablet 3  . Calcium Carbonate-Vitamin D (CALTRATE 600+D PO) Take by mouth daily.    . Multiple Vitamin (MULTIVITAMIN) tablet Take 1 tablet by mouth daily.     No current facility-administered medications on file prior to visit.    Review of Systems  Constitutional: Positive for fatigue. Negative for activity change, appetite change, fever and unexpected weight change.  HENT: Negative for congestion, ear pain, rhinorrhea, sinus pressure and sore throat.   Eyes: Negative for pain, redness and visual disturbance.  Respiratory: Negative for cough, shortness of breath and wheezing.   Cardiovascular: Negative for chest pain and palpitations.  Gastrointestinal: Negative for abdominal pain, blood in stool, constipation and diarrhea.  Endocrine: Negative for polydipsia and polyuria.  Genitourinary: Negative for dysuria, frequency and urgency.  Musculoskeletal: Negative for arthralgias, back pain and myalgias.  Skin: Negative for pallor and rash.  Allergic/Immunologic: Negative for environmental allergies.  Neurological: Negative for dizziness, syncope and headaches.  Hematological: Negative for  adenopathy. Does not bruise/bleed easily.  Psychiatric/Behavioral: Negative for decreased concentration and dysphoric mood. The patient is not nervous/anxious.        Objective:   Physical Exam Constitutional:      General: She is not in acute distress.    Appearance: Normal appearance. She is well-developed. She is obese. She is not ill-appearing or diaphoretic.  HENT:     Head: Normocephalic and atraumatic.     Right Ear: Tympanic membrane, ear canal and external ear normal.     Left Ear: Tympanic membrane, ear canal and external ear normal.     Nose: Nose normal. No congestion.     Mouth/Throat:     Mouth: Mucous membranes are moist.     Pharynx: Oropharynx is clear. No posterior oropharyngeal erythema.  Eyes:     General: No scleral icterus.    Extraocular Movements: Extraocular movements intact.     Conjunctiva/sclera: Conjunctivae normal.     Pupils: Pupils are equal, round, and reactive to light.  Neck:     Thyroid: No thyromegaly.     Vascular: No carotid bruit or JVD.  Cardiovascular:     Rate and Rhythm: Normal rate and regular rhythm.     Pulses: Normal pulses.     Heart sounds: Normal heart sounds. No gallop.   Pulmonary:     Effort: Pulmonary effort is normal. No respiratory distress.     Breath sounds: Normal breath sounds. No wheezing.     Comments:  Good air exch Chest:     Chest wall: No tenderness.  Abdominal:     General: Bowel sounds are normal. There is no distension or abdominal bruit.     Palpations: Abdomen is soft. There is no mass.     Tenderness: There is no abdominal tenderness.     Hernia: No hernia is present.  Genitourinary:    Comments: Breast exam: No mass, nodules, thickening, tenderness, bulging, retraction, inflamation, nipple discharge or skin changes noted.  No axillary or clavicular LA.     Baseline surgical change in L breast/axilla without tenderness Musculoskeletal:        General: No tenderness. Normal range of motion.      Cervical back: Normal range of motion and neck supple. No rigidity. No muscular tenderness.     Right lower leg: No edema.     Left lower leg: No edema.  Lymphadenopathy:     Cervical: No cervical adenopathy.  Skin:    General: Skin is warm and dry.     Coloration: Skin is not pale.     Findings: No erythema or rash.     Comments: Skin tags on neck  Neurological:     Mental Status: She is alert. Mental status is at baseline.     Cranial Nerves: No cranial nerve deficit.     Motor: No abnormal muscle tone.     Coordination: Coordination normal.     Gait: Gait normal.     Deep Tendon Reflexes: Reflexes are normal and symmetric. Reflexes normal.  Psychiatric:        Mood and Affect: Mood normal.        Cognition and Memory: Cognition and memory normal.     Comments: Pleasant            Assessment & Plan:   Problem List Items Addressed This Visit      Other   Routine general medical examination at a health care facility - Primary    Reviewed health habits including diet and exercise and skin cancer prevention Reviewed appropriate screening tests for age  Also reviewed health mt list, fam hx and immunization status , as well as social and family history   See HPI Labs reviewed  Referral made to healthy weight and wellness clinic Commended good habits utd mammogram and breast cancer f/u  Sent for pap and dexa report from gyn  Colonoscopy 2018 with 5 y recall  No changes in family history      Obesity    Discussed how this problem influences overall health and the risks it imposes  Reviewed plan for weight loss with lower calorie diet (via better food choices and also portion control or program like weight watchers) and exercise building up to or more than 30 minutes 5 days per week including some aerobic activity   [facility] and treatment Good effort so far       Relevant Orders   Amb Ref to Medical Weight  Management   Family history of colon cancer    Last colonoscopy 2018 with 5 y recall  Pt is aware      Iron deficiency    Taking niferex 150 mg daily  Stable cbc Some fatigue/unsure if related

## 2020-10-04 ENCOUNTER — Other Ambulatory Visit: Payer: Self-pay

## 2020-10-04 ENCOUNTER — Ambulatory Visit: Payer: BC Managed Care – PPO | Admitting: Physician Assistant

## 2020-10-04 VITALS — BP 131/79 | HR 72 | Temp 98.2°F | Resp 18 | Ht 63.0 in | Wt 177.0 lb

## 2020-10-04 DIAGNOSIS — E7800 Pure hypercholesterolemia, unspecified: Secondary | ICD-10-CM | POA: Diagnosis not present

## 2020-10-04 DIAGNOSIS — E785 Hyperlipidemia, unspecified: Secondary | ICD-10-CM | POA: Insufficient documentation

## 2020-10-04 NOTE — Patient Instructions (Signed)
I encourage you to start fish oil over-the-counter and 81 mg aspirin on a daily basis.  I have enclosed information regarding Mediterranean style eating, this approach can be very helpful for reducing elevated LDL levels.  Please let us know if there is anything else we can do for you  Kennieth Rad, PA-C Physician Assistant [place] [email or web address]    Mediterranean Diet A Mediterranean diet refers to food and lifestyle choices that are based on the traditions of countries located on the [place]. This way of eating has been shown to help prevent certain conditions and improve outcomes for people who have chronic diseases, like kidney disease and heart disease. What are tips for following this plan? Lifestyle  Cook and eat meals together with your family, when possible.  Drink enough fluid to keep your urine clear or pale yellow.  Be physically active every day. This includes: ? Aerobic exercise like running or swimming. ? Leisure activities like gardening, walking, or housework.  Get 7-8 hours of sleep each night.  If recommended by your health care provider, drink red wine in moderation. This means 1 glass a day for nonpregnant women and 2 glasses a day for men. A glass of wine equals 5 oz (150 mL). Reading food labels  Check the serving size of packaged foods. For foods such as rice and pasta, the serving size refers to the amount of cooked product, not dry.  Check the total fat in packaged foods. Avoid foods that have saturated fat or trans fats.  Check the ingredients list for added sugars, such as corn syrup.   Shopping  At the grocery store, buy most of your food from the areas near the walls of the store. This includes: ? Fresh fruits and vegetables (produce). ? Grains, beans, nuts, and seeds. Some of these may be available in unpackaged forms or large amounts (in bulk). ? Fresh  seafood. ? Poultry and eggs. ? Low-fat dairy products.  Buy whole ingredients instead of prepackaged foods.  Buy fresh fruits and vegetables in-season from local farmers markets.  Buy frozen fruits and vegetables in resealable bags.  If you do not have access to quality fresh seafood, buy precooked frozen shrimp or canned fish, such as tuna, salmon, or sardines.  Buy small amounts of raw or cooked vegetables, salads, or olives from the deli or salad bar at your store.  Stock your pantry so you always have certain foods on hand, such as olive oil, canned tuna, canned tomatoes, rice, pasta, and beans. Cooking  Cook foods with extra-virgin olive oil instead of using butter or other vegetable oils.  Have meat as a side dish, and have vegetables or grains as your main dish. This means having meat in small portions or adding small amounts of meat to foods like pasta or stew.  Use beans or vegetables instead of meat in common dishes like chili or lasagna.  Experiment with different cooking methods. Try roasting or broiling vegetables instead of steaming or sauteing them.  Add frozen vegetables to soups, stews, pasta, or rice.  Add nuts or seeds for added healthy fat at each meal. You can add these to yogurt, salads, or vegetable dishes.  Marinate fish or vegetables using olive oil, lemon juice, garlic, and fresh herbs. Meal planning  Plan to eat 1 vegetarian meal one day each week. Try to work up to 2 vegetarian meals, if possible.  Eat seafood 2 or more times a week.  Have healthy snacks  readily available, such as: ? Vegetable sticks with hummus. ? [place] yogurt. ? Fruit and nut trail mix.  Eat balanced meals throughout the week. This includes: ? Fruit: 2-3 servings a day ? Vegetables: 4-5 servings a day ? Low-fat dairy: 2 servings a day ? Fish, poultry, or lean meat: 1 serving a day ? Beans and legumes: 2 or more servings a week ? Nuts and seeds: 1-2 servings a day ? Whole  grains: 6-8 servings a day ? Extra-virgin olive oil: 3-4 servings a day  Limit red meat and sweets to only a few servings a month   What are my food choices?  Mediterranean diet ? Recommended  Grains: Whole-grain pasta. Brown rice. Bulgar wheat. Polenta. Couscous. Whole-wheat bread. [place].  Vegetables: Artichokes. Beets. Broccoli. Cabbage. Carrots. Eggplant. Green beans. Chard. Kale. Spinach. Onions. Leeks. Peas. Squash. Tomatoes. Peppers. Radishes.  Fruits: Apples. Apricots. Avocado. Berries. Bananas. Cherries. Dates. Figs. Grapes. Lemons. Melon. Oranges. Peaches. Plums. Pomegranate.  Meats and other protein foods: Beans. Almonds. Sunflower seeds. Pine nuts. Peanuts. [place]. Salmon. Scallops. Shrimp. [place]. Tilapia. Clams. Oysters. Eggs.  Dairy: Low-fat milk. Cheese. Greek yogurt.  Beverages: Water. Red wine. Herbal tea.  Fats and oils: Extra virgin olive oil. Avocado oil. Grape seed oil.  Sweets and desserts: [place] yogurt with honey. Baked apples. Poached pears. Trail mix.  Seasoning and other foods: Basil. Cilantro. Coriander. Cumin. Mint. Parsley. Sage. Rosemary. Tarragon. Garlic. Oregano. Thyme. Pepper. Balsalmic vinegar. Tahini. Hummus. Tomato sauce. Olives. Mushrooms. ? Limit these  Grains: Prepackaged pasta or rice dishes. Prepackaged cereal with added sugar.  Vegetables: Deep fried potatoes (french fries).  Fruits: Fruit canned in syrup.  Meats and other protein foods: Beef. Pork. Lamb. Poultry with skin. Hot dogs. [place] Salines.  Dairy: Ice cream. Sour cream. Whole milk.  Beverages: Juice. Sugar-sweetened soft drinks. Beer. Liquor and spirits.  Fats and oils: Butter. Canola oil. Vegetable oil. Beef fat (tallow). Lard.  Sweets and desserts: Cookies. Cakes. Pies. Candy.  Seasoning and other foods: Mayonnaise. Premade sauces and marinades. The items listed may not be a complete list. Talk with your dietitian about what dietary choices are right for  you. Summary  The Mediterranean diet includes both food and lifestyle choices.  Eat a variety of fresh fruits and vegetables, beans, nuts, seeds, and whole grains.  Limit the amount of red meat and sweets that you eat.  Talk with your health care provider about whether it is safe for you to drink red wine in moderation. This means 1 glass a day for nonpregnant women and 2 glasses a day for men. A glass of wine equals 5 oz (150 mL). This information is not intended to replace advice given to you by your health care provider. Make sure you discuss any questions you have with your health care provider. Document Revised: 02/02/2016 Document Reviewed: 01/26/2016 Elsevier Patient Education  [place].

## 2020-10-04 NOTE — Progress Notes (Signed)
Patient verified DOB Patient has not taken medication today and patient has eaten today. Patient reports low iron levels in the past and elevated cholesterol.

## 2020-10-04 NOTE — Progress Notes (Signed)
New Patient Office Visit  Subjective:  Patient ID: Jamie Shaffer, female    DOB: 1961-03-27  Age: 60 y.o. MRN: [id number]  CC:  Chief Complaint  Patient presents with  . lab results    HPI Jamie Shaffer reports that she recently saw her primary care provider and had labs completed.  Reports that she is concerned that she may need iron supplementation, and that her cholesterol is high.  Denies fatigue, history of anemia does endorse family history of high cholesterol.  Reports that she does exercise several times a week but also endorses a higher cholesterol diet.    Past Medical History:  Diagnosis Date  . Cancer [facility]) 10-2015   left breast/last radiation tx08/17/ no chemo  . Fibrocystic breast   . GERD (gastroesophageal reflux disease)   . Panic disorder   . Personal history of radiation therapy   . Post-menopausal     Past Surgical History:  Procedure Laterality Date  . BREAST BIOPSY Left 09/28/2015  . BREAST LUMPECTOMY Left 11/02/2015  . BREAST LUMPECTOMY WITH RADIOACTIVE SEED AND SENTINEL LYMPH NODE BIOPSY Left 11/02/2015   Procedure: BREAST LUMPECTOMY WITH RADIOACTIVE SEED AND SENTINEL LYMPH NODE BIOPSY;  Surgeon: [name], MD;  Location: [place];  Service: General;  Laterality: Left;  . CESAREAN SECTION    . fibroid tumor    . PILONIDAL CYST EXCISION    . VAGINAL HYSTERECTOMY     partial    Family History  Problem Relation Age of Onset  . Hypertension Mother   . Diabetes Mother   . Heart failure Mother 39  . Breast cancer Mother 47       +lump  . Breast cancer Sister        dx. 60s; s/p mastectomy  . Breast cancer Sister 21       s/p lump; negative genetic testing  . Fibroids Sister   . Heart Problems Father        valve replacement  . Kidney failure Father 45  . Prostate cancer Father        dx. later age; did not cause his death  . Breast cancer Sister 70       "cancerous cells"; s/p radiation  . Fibroids  Sister   . Fibroids Sister   . Bladder Cancer Brother 58       smoker; had issues for 7-8 yrs before going to the doctor  . Colon cancer Maternal Aunt        dx. 39s  . Heart Problems Maternal Uncle   . Lung cancer Brother        s/p surgery to remove nodule?; not a smoker; dx. 61-62  . Alzheimer's disease Maternal Aunt        d. 77  . Colon cancer Maternal Uncle        dx. in 44s or younger  . Lung cancer Cousin        maternal 1st cousin dx. 30s; smoker  . Prostate cancer Cousin        maternal 1st cousin dx. early 48s  . Melanoma Cousin        maternal 1st cousin dx. 61s  . Breast cancer Cousin 107       maternal 1st cousin   . Kidney cancer Cousin        maternal 1st cousin dx. 36s  . Bladder Cancer Paternal Uncle        dx. late 63s-80s  . Aneurysm  Maternal Grandmother        brain; d. 48s  . Colon cancer Maternal Grandfather        d. 40s-50s  . Heart disease Paternal Grandmother        d. 28s  . Heart attack Paternal Grandmother   . Heart disease Paternal Grandfather        d. 81s  . Heart attack Paternal Grandfather   . Breast cancer Cousin        paternal 1st cousin dx. 82s  . Cancer Cousin        (x2) paternal 1st cousins d. unspecified cancers at unspecified ages    Social History   Socioeconomic History  . Marital status: Married    Spouse name: Not on file  . Number of children: 2  . Years of education: Not on file  . Highest education level: Not on file  Occupational History  . Occupation: Environmental health practitioner: [employer]  Tobacco Use  . Smoking status: Never Smoker  . Smokeless tobacco: Never Used  . Tobacco comment: tried smoking as a teenager, but no continued use  Substance and Sexual Activity  . Alcohol use: No  . Drug use: No  . Sexual activity: Not Currently  Other Topics Concern  . Not on file  Social History Narrative   Exercise regularly at the gym   Social Determinants of Health    Financial Resource Strain: Not on file  Food Insecurity: Not on file  Transportation Needs: Not on file  Physical Activity: Not on file  Stress: Not on file  Social Connections: Not on file  Intimate Partner Violence: Not on file    ROS Review of Systems  Constitutional: Negative for chills, fatigue and fever.  HENT: Negative.   Eyes: Negative.   Respiratory: Negative for shortness of breath.   Cardiovascular: Negative for chest pain.  Gastrointestinal: Negative.   Endocrine: Negative.   Genitourinary: Negative.   Musculoskeletal: Negative.   Skin: Negative.   Allergic/Immunologic: Negative.   Neurological: Negative.   Hematological: Negative.   Psychiatric/Behavioral: Negative.     Objective:   Today's Vitals: BP 131/79 (BP Location: Right Arm, Patient Position: Sitting, Cuff Size: Normal)   Pulse 72   Temp 98.2 F (36.8 C) (Oral)   Resp 18   Ht 5\' 3"  (1.6 m)   Wt 177 lb (80.3 kg)   SpO2 98%   BMI 31.35 kg/m   Physical Exam Vitals and nursing note reviewed.  Constitutional:      Appearance: Normal appearance.  HENT:     Head: Normocephalic.     Right Ear: External ear normal.     Left Ear: External ear normal.     Nose: Nose normal.     Mouth/Throat:     Mouth: Mucous membranes are moist.     Pharynx: Oropharynx is clear.  Eyes:     Extraocular Movements: Extraocular movements intact.     Conjunctiva/sclera: Conjunctivae normal.     Pupils: Pupils are equal, round, and reactive to light.  Cardiovascular:     Rate and Rhythm: Normal rate and regular rhythm.     Pulses: Normal pulses.     Heart sounds: Normal heart sounds.  Pulmonary:     Effort: Pulmonary effort is normal.     Breath sounds: Normal breath sounds.  Musculoskeletal:        General: Normal range of motion.     Cervical back: Normal range of motion  and neck supple.  Skin:    General: Skin is warm and dry.  Neurological:     General: No focal deficit present.     Mental Status: She  is alert and oriented to person, place, and time.  Psychiatric:        Mood and Affect: Mood normal.        Behavior: Behavior normal.        Thought Content: Thought content normal.        Judgment: Judgment normal.     Assessment & Plan:   Problem List Items Addressed This Visit   None   Visit Diagnoses    Elevated LDL cholesterol level    -  Primary      Outpatient Encounter Medications as of 10/04/2020  Medication Sig  . anastrozole (ARIMIDEX) 1 MG tablet Take 1 tablet (1 mg total) by mouth daily.  . Calcium Carbonate-Vitamin D (CALTRATE 600+D PO) Take by mouth daily.  . iron polysaccharides (NIFEREX) 150 MG capsule Take 1 capsule (150 mg total) by mouth daily.  . Multiple Vitamin (MULTIVITAMIN) tablet Take 1 tablet by mouth daily.  Marland Kitchen ALPRAZolam (XANAX) 0.5 MG tablet Take 1 tablet (0.5 mg total) by mouth 2 (two) times daily as needed for anxiety or sleep. (Patient not taking: Reported on 10/04/2020)   No facility-administered encounter medications on file as of 10/04/2020.   1. Elevated LDL cholesterol level Reviewed recent labs with patient, patient education given on adding fish oil and 81 mg aspirin to daily regimen, continue cardiovascular exercise, work on low-cholesterol diet.  The 10-year ASCVD risk score Mikey Bussing DC Brooke Bonito., et al., 2013) is: 5.3%   Values used to calculate the score:     Age: 56 years     Sex: Female     Is Non-Hispanic African American: Yes     Diabetic: No     Tobacco smoker: No     Systolic Blood Pressure: 235 mmHg     Is BP treated: No     HDL Cholesterol: 79.4 mg/dL     Total Cholesterol: 215 mg/dL   I have reviewed the patient's medical history (PMH, PSH, Social History, Family History, Medications, and allergies) , and have been updated if relevant. I spent 22 minutes reviewing chart and  face to face time with patient.     Follow-up: Return if symptoms worsen or fail to improve.   Loraine Grip Mayers, PA-C

## 2020-10-18 ENCOUNTER — Other Ambulatory Visit: Payer: Self-pay | Admitting: Hematology and Oncology

## 2020-10-18 DIAGNOSIS — Z853 Personal history of malignant neoplasm of breast: Secondary | ICD-10-CM

## 2020-10-19 ENCOUNTER — Ambulatory Visit
Admission: RE | Admit: 2020-10-19 | Discharge: 2020-10-19 | Disposition: A | Discharge status: Home / Self Care | Payer: BC Managed Care – PPO | Source: Ambulatory Visit | Attending: Hematology and Oncology | Admitting: Hematology and Oncology

## 2020-10-19 ENCOUNTER — Other Ambulatory Visit: Payer: Self-pay | Admitting: Hematology and Oncology

## 2020-10-19 ENCOUNTER — Other Ambulatory Visit: Payer: Self-pay

## 2020-10-19 DIAGNOSIS — Z853 Personal history of malignant neoplasm of breast: Secondary | ICD-10-CM

## 2020-10-19 DIAGNOSIS — R921 Mammographic calcification found on diagnostic imaging of breast: Secondary | ICD-10-CM

## 2020-10-21 ENCOUNTER — Other Ambulatory Visit: Payer: Self-pay

## 2020-10-21 ENCOUNTER — Ambulatory Visit
Admission: RE | Admit: 2020-10-21 | Discharge: 2020-10-21 | Disposition: A | Discharge status: Home / Self Care | Payer: BC Managed Care – PPO | Source: Ambulatory Visit | Attending: Hematology and Oncology | Admitting: Hematology and Oncology

## 2020-10-21 DIAGNOSIS — R921 Mammographic calcification found on diagnostic imaging of breast: Secondary | ICD-10-CM

## 2020-10-21 DIAGNOSIS — N6489 Other specified disorders of breast: Secondary | ICD-10-CM | POA: Diagnosis not present

## 2020-10-26 ENCOUNTER — Encounter (INDEPENDENT_AMBULATORY_CARE_PROVIDER_SITE_OTHER): Payer: Self-pay

## 2020-11-22 NOTE — Progress Notes (Signed)
Patient Care Team: Tower, [name], MD as PCP - General  DIAGNOSIS:    ICD-10-CM   1. Malignant neoplasm of upper-outer quadrant of left breast in female, estrogen receptor positive ([place])  C50.412    Z17.0     SUMMARY OF ONCOLOGIC HISTORY: Oncology History  Breast cancer of upper-outer quadrant of left female breast ([place])  09/28/2015 Initial Diagnosis   Left breast biopsy 2:00 position: IDC with DCIS, grade 1, ER 100%, PR 20%, Ki-67 10%, HER-2 negative ratio 1.32, MRI revealed 7 x 5 x 10 mm in the posterior third left breast, T1 cN0 stage IA clinical stage   10/04/2015 Genetic Testing   There were no deleterious mutations or a variants of uncertain significance (VUSes) identified.  Genes tested: APC, ATM, AXIN2, BARD1, BMPR1A, BRCA1, BRCA2, BRIP1, CDH1, CDK4, CDKN2A, CHEK2, EPCAM, FANCC, MLH1, MSH2 (with MSH2 Exons 1-7 Inversion Analysis), MSH6, MUTYH, NBN, PALB2, PMS2, POLD1, POLE, PTEN, RAD51C, RAD51D, SCG5/GREM1, SMAD4, STK11, TP53, VHL, and XRCC2.   11/02/2015 Surgery   Left lumpectomy ([place]): IDC with DCIS, 1 cm, margins negative, 1/3 lymph nodes positive, ER 100%, PR 20%, Ki-67 10%, HER-2 negative ratio 1.32, T1bN1 stage IIA, Mammaprint luminal-type A, low risk   12/07/2015 - 02/03/2016 Radiation Therapy   Radiation ([place]): Left breast/left supraclav: 50.4Gy, 28 fractions, left axilla: 12.32 Gy, 7 fractions, left breast boost: 14.4 Gy in 8 fractions.     02/07/2016 -  Anti-estrogen oral therapy   Anastrozole $RemoveBefo'1mg'dttCYNwWQDB$  daily     CHIEF COMPLIANT: Follow-up of left breast cancer on anastrozole therapy  INTERVAL HISTORY: Jamie Shaffer is a 60 y.o. with above-mentioned history of left breast cancer treated with lumpectomy,radiation, and whois currently on anastrozole. Mammogram on 10/19/20 showed calcifications at the lumpectomy sire in the left breast.Biopsy on 10/21/20 showed calcifications and no evidence of malignancy. She presentsto the clinic today for annual follow-up.  She has been missing some doses of anastrozole but overall she is tolerating it better since she started taking it at bedtime.  Hot flashes are also subsided.  Denies any lumps or nodules in the breast.  ALLERGIES:  has No Known Allergies.  MEDICATIONS:  Current Outpatient Medications  Medication Sig Dispense Refill  . ALPRAZolam (XANAX) 0.5 MG tablet Take 1 tablet (0.5 mg total) by mouth 2 (two) times daily as needed for anxiety or sleep. (Patient not taking: Reported on 10/04/2020) 10 tablet 0  . anastrozole (ARIMIDEX) 1 MG tablet Take 1 tablet (1 mg total) by mouth daily. 90 tablet 3  . Calcium Carbonate-Vitamin D (CALTRATE 600+D PO) Take by mouth daily.    . iron polysaccharides (NIFEREX) 150 MG capsule Take 1 capsule (150 mg total) by mouth daily. 90 capsule 3  . Multiple Vitamin (MULTIVITAMIN) tablet Take 1 tablet by mouth daily.     No current facility-administered medications for this visit.    PHYSICAL EXAMINATION: ECOG PERFORMANCE STATUS: 1 - Symptomatic but completely ambulatory  Vitals:   11/23/20 0956  BP: 139/74  Pulse: 69  Resp: 17  Temp: 97.6 F (36.4 C)  SpO2: 100%   Filed Weights   11/23/20 0956  Weight: 176 lb 8 oz (80.1 kg)    BREAST: No palpable masses or nodules in either right or left breasts. No palpable axillary supraclavicular or infraclavicular adenopathy no breast tenderness or nipple discharge. (exam performed in the presence of a chaperone)  LABORATORY DATA:  I have reviewed the data as listed CMP Latest Ref Rng & Units 08/26/2020 01/27/2019 01/16/2018  Glucose 70 - 99 mg/dL 94 98 99  BUN 6 - 23 mg/dL _0 Creatinine 0.40 - 1.20 mg/dL 0.76 0.75 0.74  Sodium 135 - 145 mEq/L 140 139 140  Potassium 3.5 - 5.1 mEq/L 4.3 4.3 4.2  Chloride 96 - 112 mEq/L 105 103 104  CO2 19 - 32 mEq/L _1 Calcium 8.4 - 10.5 mg/dL 9.7 9.9 10.0  Total Protein 6.0 - 8.3 g/dL 7.0 7.6 7.6  Total Bilirubin 0.2 - 1.2 mg/dL 0.5 0.5 0.5  Alkaline Phos 39 - 117 U/L  100 93 103  AST 0 - 37 U/L _2 ALT 0 - 35 U/L _3 Lab Results  Component Value Date   WBC 3.6 (L) 08/26/2020   HGB 11.8 (L) 08/26/2020   HCT 36.7 08/26/2020   MCV 85.0 08/26/2020   PLT 287.0 08/26/2020   NEUTROABS 1.5 08/26/2020    ASSESSMENT & PLAN:  Breast cancer of upper-outer quadrant of left female breast ([place]) Left lumpectomy: IDC with DCIS, 1 cm, margins negative, 1/3 lymph nodes positive, ER 100%, PR 20%, Ki-67 10%, HER-2 negative ratio 1.32, T1bN1 stage II A Mammaprint: Luminal type A, low risk, did not need chemotherapy. Adjuvant radiation therapy from 11/28/2015 to08/18/2017  CurrentTreatment: Adjuvant antiestrogen therapy With anastrozole 1 mg daily started 02/07/16 (plan for 7 years)  Anastrozole Toxicities: 1.  Hot flashes: Much better 2. hair thinning    Surveillance: 1.Breast exam6/9/21:Benign  2.Mammogram5/09/2020: New punctate calcs at lumpectomy site, breast density category C Left breast biopsy UOQ: Calcifications, no malignancy identified. Return to clinic in 1 year for follow-up   No orders of the defined types were placed in this encounter.  The patient has a good understanding of the overall plan. she agrees with it. she will call with any problems that may develop before the next visit here.  Total time spent: 20 mins including face to face time and time spent for planning, charting and coordination of care  Rulon Eisenmenger, MD, MPH 11/23/2020  I, Cloyde Reams Dorshimer, am acting as scribe for Dr. Nicholas Lose.  I have reviewed the above documentation for accuracy and completeness, and I agree with the above.

## 2020-11-22 NOTE — Assessment & Plan Note (Signed)
Left lumpectomy: IDC with DCIS, 1 cm, margins negative, 1/3 lymph nodes positive, ER 100%, PR 20%, Ki-67 10%, HER-2 negative ratio 1.32, T1bN1 stage II A Mammaprint: Luminal type A, low risk, did not need chemotherapy. Adjuvant radiation therapy from 11/28/2015 to08/18/2017  CurrentTreatment: Adjuvant antiestrogen therapy With anastrozole 1 mg daily started 02/07/16 (plan for 7 years)  Anastrozole Toxicities: 1. Intermittent night sweats: she had these before anastrozole 2. occasional hot flashes  3.complaining of weight gain  Surveillance: 1.Breast exam6/9/21:Benign  2.Mammogram5/3/21:Benign appearing calcs at lumpectomy site, breast density category C  

## 2020-11-23 ENCOUNTER — Other Ambulatory Visit: Payer: Self-pay

## 2020-11-23 ENCOUNTER — Inpatient Hospital Stay: Payer: BC Managed Care – PPO | Attending: Hematology and Oncology | Admitting: Hematology and Oncology

## 2020-11-23 DIAGNOSIS — C50412 Malignant neoplasm of upper-outer quadrant of left female breast: Secondary | ICD-10-CM | POA: Insufficient documentation

## 2020-11-23 DIAGNOSIS — Z923 Personal history of irradiation: Secondary | ICD-10-CM | POA: Insufficient documentation

## 2020-11-23 DIAGNOSIS — Z170 Estrogen receptor positive status [ER+]: Secondary | ICD-10-CM | POA: Diagnosis not present

## 2020-11-23 DIAGNOSIS — N951 Menopausal and female climacteric states: Secondary | ICD-10-CM | POA: Diagnosis not present

## 2020-11-23 DIAGNOSIS — C773 Secondary and unspecified malignant neoplasm of axilla and upper limb lymph nodes: Secondary | ICD-10-CM | POA: Diagnosis not present

## 2020-11-23 DIAGNOSIS — Z79811 Long term (current) use of aromatase inhibitors: Secondary | ICD-10-CM | POA: Diagnosis not present

## 2020-11-23 MED ORDER — ANASTROZOLE 1 MG PO TABS
1.0000 mg | ORAL_TABLET | Freq: Every day | ORAL | 3 refills | Status: DC
Start: 2020-11-23 — End: 2021-11-16

## 2020-11-24 ENCOUNTER — Telehealth: Payer: Self-pay | Admitting: Hematology and Oncology

## 2020-11-24 NOTE — Telephone Encounter (Signed)
Scheduled per 6/8 los. Called and spoke with pt and confirmed 6/8

## 2021-01-24 ENCOUNTER — Other Ambulatory Visit: Payer: Self-pay | Admitting: *Deleted

## 2021-01-24 MED ORDER — ALPRAZOLAM 0.5 MG PO TABS: 0.5000 mg | ORAL_TABLET | Freq: Two times a day (BID) | ORAL | 0 refills | Status: DC | PRN

## 2021-01-24 NOTE — Telephone Encounter (Signed)
Patient called stating that she is getting ready to fly to [place]. Patient stated that she gets nervous when she flies. Patient is requesting a script for some xanax to help her with her travel. Pharmacy [facility]

## 2021-06-13 ENCOUNTER — Encounter: Payer: Self-pay | Admitting: Family Medicine

## 2021-06-13 ENCOUNTER — Other Ambulatory Visit: Payer: Self-pay

## 2021-06-13 ENCOUNTER — Telehealth (INDEPENDENT_AMBULATORY_CARE_PROVIDER_SITE_OTHER): Payer: BC Managed Care – PPO | Admitting: Family Medicine

## 2021-06-13 DIAGNOSIS — U071 COVID-19: Secondary | ICD-10-CM

## 2021-06-13 MED ORDER — MOLNUPIRAVIR EUA 200MG CAPSULE: 4.0000 | ORAL_CAPSULE | Freq: Two times a day (BID) | ORAL | 0 refills | Status: DC

## 2021-06-13 NOTE — Progress Notes (Signed)
Virtual Visit via Video Note  I connected with Jamie Shaffer on 06/13/21 at  3:00 PM EST by a video enabled telemedicine application and verified that I am speaking with the correct person using two identifiers.  Location: Patient: home Provider: office    I discussed the limitations of evaluation and management by telemedicine and the availability of in person appointments. The patient expressed understanding and agreed to proceed.  This visit occurred during the SARS-CoV-2 public health emergency.  Safety protocols were in place, including screening questions prior to the visit, additional usage of staff PPE, and extensive cleaning of exam room while observing appropriate contact time as indicated for disinfecting solutions.   Parties involved in encounter  Patient: Jamie Shaffer   Provider:  Loura Pardon MD   History of Present Illness: Pt presents with covid 19   Symptoms started on Saturday  Sore throat and felt lousy  The next am felt terrible/tired  Niece contacted her and told her she tested positive so pt did one and she was positive   Nasal congestion and sneezing  Sore throat (lemonade burned when swallowing) Mild headache -whole head  Ears hurt both sides   No fever but temp is a little higher than usual  In the am, some soreness/aches  A little bit of chills   Hoarse -mild Some cough  A little phlegm - clear/scant color  No wheeze  No sob   Felt a little nauseated after eating    Otc Tylenol  Ate some soup  Mucinex DM   Patient Active Problem List   Diagnosis Date Noted   COVID-19 06/13/2021   Elevated LDL cholesterol level 10/04/2020   Iron deficiency 09/20/2020   History of breast cancer 09/20/2020   Numbness and tingling of right leg 11/20/2019   Breast cancer of upper-outer quadrant of left female breast ([place]) 10/24/2015   Genetic testing 10/24/2015   Family history of breast cancer in first degree relative 10/04/2015   Family  history of colon cancer 10/04/2015   Acid reflux 09/20/2014   Obesity 09/20/2014   Menopausal symptoms 01/16/2013   Routine general medical examination at a health care facility 02/04/2011   FIBROCYSTIC BREAST DISEASE 10/20/2007   PANIC DISORDER 10/17/2007   Past Medical History:  Diagnosis Date   Cancer ([place]) 10-2015   left breast/last radiation tx08/17/ no chemo   Fibrocystic breast    GERD (gastroesophageal reflux disease)    Panic disorder    Personal history of radiation therapy    Post-menopausal    Past Surgical History:  Procedure Laterality Date   BREAST BIOPSY Left 09/28/2015   BREAST LUMPECTOMY Left 11/02/2015   BREAST LUMPECTOMY WITH RADIOACTIVE SEED AND SENTINEL LYMPH NODE BIOPSY Left 11/02/2015   Procedure: BREAST LUMPECTOMY WITH RADIOACTIVE SEED AND SENTINEL LYMPH NODE BIOPSY;  Surgeon: [name], MD;  Location: [place];  Service: General;  Laterality: Left;   CESAREAN SECTION     fibroid tumor     PILONIDAL CYST EXCISION     VAGINAL HYSTERECTOMY     partial   Social History   Tobacco Use   Smoking status: Never   Smokeless tobacco: Never   Tobacco comments:    tried smoking as a teenager, but no continued use  Substance Use Topics   Alcohol use: No   Drug use: No   Family History  Problem Relation Age of Onset   Hypertension Mother    Diabetes Mother    Heart failure Mother  26   Breast cancer Mother 85       +lump   Breast cancer Sister        dx. 53s; s/p mastectomy   Breast cancer Sister 53       s/p lump; negative genetic testing   Fibroids Sister    Heart Problems Father        valve replacement   Kidney failure Father 58   Prostate cancer Father        dx. later age; did not cause his death   Breast cancer Sister 1       "cancerous cells"; s/p radiation   Fibroids Sister    Fibroids Sister    Bladder Cancer Brother 48       smoker; had issues for 7-8 yrs before going to the doctor   Colon cancer Maternal Aunt         dx. 78s   Heart Problems Maternal Uncle    Lung cancer Brother        s/p surgery to remove nodule?; not a smoker; dx. 61-62   Alzheimer's disease Maternal Aunt        d. 80   Colon cancer Maternal Uncle        dx. in 53s or younger   Lung cancer Cousin        maternal 1st cousin dx. 38s; smoker   Prostate cancer Cousin        maternal 1st cousin dx. early 88s   Melanoma Cousin        maternal 1st cousin dx. 89s   Breast cancer Cousin 5       maternal 1st cousin    Kidney cancer Cousin        maternal 1st cousin dx. 25s   Bladder Cancer Paternal Uncle        dx. late 70s-80s   Aneurysm Maternal Grandmother        brain; d. 57s   Colon cancer Maternal Grandfather        d. 40s-50s   Heart disease Paternal Grandmother        d. 36s   Heart attack Paternal Grandmother    Heart disease Paternal Grandfather        d. 45s   Heart attack Paternal Grandfather    Breast cancer Cousin        paternal 1st cousin dx. 57s   Cancer Cousin        (x2) paternal 1st cousins d. unspecified cancers at unspecified ages   No Known Allergies Current Outpatient Medications on File Prior to Visit  Medication Sig Dispense Refill   ALPRAZolam (XANAX) 0.5 MG tablet Take 1 tablet (0.5 mg total) by mouth 2 (two) times daily as needed for anxiety or sleep. 10 tablet 0   anastrozole (ARIMIDEX) 1 MG tablet Take 1 tablet (1 mg total) by mouth daily. 90 tablet 3   Calcium Carbonate-Vitamin D (CALTRATE 600+D PO) Take by mouth daily.     iron polysaccharides (NIFEREX) 150 MG capsule Take 1 capsule (150 mg total) by mouth daily. 90 capsule 3   Multiple Vitamin (MULTIVITAMIN) tablet Take 1 tablet by mouth daily.     No current facility-administered medications on file prior to visit.   Review of Systems  Constitutional:  Positive for chills and malaise/fatigue. Negative for fever.  HENT:  Positive for congestion and sore throat. Negative for ear pain and sinus pain.   Eyes:  Negative for blurred  vision, discharge and redness.  Respiratory:  Positive for cough and sputum production. Negative for shortness of breath, wheezing and stridor.   Cardiovascular:  Negative for chest pain, palpitations and leg swelling.  Gastrointestinal:  Positive for nausea. Negative for abdominal pain, diarrhea and vomiting.  Musculoskeletal:  Negative for myalgias.  Skin:  Negative for rash.  Neurological:  Positive for headaches. Negative for dizziness.    Observations/Objective: Patient appears well, in no distress Weight is baseline  No facial swelling or asymmetry Mildly hoarse voice  No obvious tremor or mobility impairment Moving neck and UEs normally Able to hear the call well  No wheeze or shortness of breath during interview  Pt clears throat frequently , occ coughs (sounds junky) Talkative and mentally sharp with no cognitive changes No skin changes on face or neck , no rash or pallor Affect is normal    Assessment and Plan: Problem List Items Addressed This Visit       Other   COVID-19    Day 3 with mild to moderate symptoms in 60 yo pt  Encouraged fluids/rest and symptom care mucinex DM,tylenol, nasal saline  Watch for wheeze or sob and watch temp  Sent in molnupiravir to take as directed and watch for side effects  (pt preferred this to paxlovid since she would not need labs)  ER precautions discussed Update if not starting to improve in a week or if worsening        Relevant Medications   molnupiravir EUA (LAGEVRIO) 200 mg CAPS capsule     Follow Up Instructions: Drink fluids and rest  mucinex DM is good for cough and congestion  Nasal saline for congestion as needed  Tylenol for fever or pain or headache  Please alert [place] if symptoms worsen (if severe or short of breath please go to the ER)   Take the antiviral (molnupiravir) as directed and let us know if you have any questions or concerns    I discussed the assessment and treatment plan with the patient. The  patient was provided an opportunity to ask questions and all were answered. The patient agreed with the plan and demonstrated an understanding of the instructions.   The patient was advised to call back or seek an in-person evaluation if the symptoms worsen or if the condition fails to improve as anticipated.     Loura Pardon, MD

## 2021-06-13 NOTE — Patient Instructions (Signed)
Drink fluids and rest  mucinex DM is good for cough and congestion  Nasal saline for congestion as needed  Tylenol for fever or pain or headache  Please alert [place] if symptoms worsen (if severe or short of breath please go to the ER)   Take the antiviral (molnupiravir) as directed and let us know if you have any questions or concerns

## 2021-06-13 NOTE — Assessment & Plan Note (Signed)
Day 3 with mild to moderate symptoms in 60 yo pt  Encouraged fluids/rest and symptom care mucinex DM,tylenol, nasal saline  Watch for wheeze or sob and watch temp  Sent in molnupiravir to take as directed and watch for side effects  (pt preferred this to paxlovid since she would not need labs)  ER precautions discussed Update if not starting to improve in a week or if worsening

## 2021-06-21 DIAGNOSIS — L0591 Pilonidal cyst without abscess: Secondary | ICD-10-CM | POA: Diagnosis not present

## 2021-08-28 ENCOUNTER — Encounter: Payer: Self-pay | Admitting: Gastroenterology

## 2021-09-07 ENCOUNTER — Other Ambulatory Visit: Payer: Self-pay | Admitting: Hematology and Oncology

## 2021-09-07 DIAGNOSIS — Z1231 Encounter for screening mammogram for malignant neoplasm of breast: Secondary | ICD-10-CM

## 2021-09-20 ENCOUNTER — Telehealth: Payer: Self-pay | Admitting: Family Medicine

## 2021-09-20 DIAGNOSIS — E7800 Pure hypercholesterolemia, unspecified: Secondary | ICD-10-CM

## 2021-09-20 DIAGNOSIS — Z0000 Encounter for general adult medical examination without abnormal findings: Secondary | ICD-10-CM

## 2021-09-20 DIAGNOSIS — E611 Iron deficiency: Secondary | ICD-10-CM

## 2021-09-20 NOTE — Telephone Encounter (Signed)
-----   Message from Ellamae Sia sent at 09/12/2021  2:15 PM EDT ----- ?Regarding: Lab orders for Thursday, 4.6.23 ?Patient is scheduled for CPX labs, please order future labs, Thanks , Terri ? ? ?

## 2021-09-21 ENCOUNTER — Other Ambulatory Visit (INDEPENDENT_AMBULATORY_CARE_PROVIDER_SITE_OTHER): Payer: BC Managed Care – PPO

## 2021-09-21 DIAGNOSIS — Z0000 Encounter for general adult medical examination without abnormal findings: Secondary | ICD-10-CM

## 2021-09-21 DIAGNOSIS — E7800 Pure hypercholesterolemia, unspecified: Secondary | ICD-10-CM | POA: Diagnosis not present

## 2021-09-21 DIAGNOSIS — E611 Iron deficiency: Secondary | ICD-10-CM | POA: Diagnosis not present

## 2021-09-21 LAB — COMPREHENSIVE METABOLIC PANEL
ALT: 32 U/L (ref 0–35)
AST: 24 U/L (ref 0–37)
Albumin: 3.9 g/dL (ref 3.5–5.2)
Alkaline Phosphatase: 94 U/L (ref 39–117)
BUN: 18 mg/dL (ref 6–23)
CO2: 29 mEq/L (ref 19–32)
Calcium: 9.6 mg/dL (ref 8.4–10.5)
Chloride: 104 mEq/L (ref 96–112)
Creatinine, Ser: 0.66 mg/dL (ref 0.40–1.20)
GFR: 93.99 mL/min (ref >60.00)
Glucose, Bld: 95 mg/dL (ref 70–99)
Potassium: 4.4 mEq/L (ref 3.5–5.1)
Sodium: 140 mEq/L (ref 135–145)
Total Bilirubin: 0.5 mg/dL (ref 0.2–1.2)
Total Protein: 6.7 g/dL (ref 6.0–8.3)

## 2021-09-21 LAB — CBC WITH DIFFERENTIAL/PLATELET
Basophils Absolute: 0 10*3/uL (ref 0.0–0.1)
Basophils Relative: 0.5 % (ref 0.0–3.0)
Eosinophils Absolute: 0.1 10*3/uL (ref 0.0–0.7)
Eosinophils Relative: 1.9 % (ref 0.0–5.0)
HCT: 35.8 % — ABNORMAL LOW (ref 36.0–46.0)
Hemoglobin: 11.5 g/dL — ABNORMAL LOW (ref 12.0–15.0)
Lymphocytes Relative: 47.3 % — ABNORMAL HIGH (ref 12.0–46.0)
Lymphs Abs: 1.8 10*3/uL (ref 0.7–4.0)
MCHC: 32.1 g/dL (ref 30.0–36.0)
MCV: 84.3 fl (ref 78.0–100.0)
Monocytes Absolute: 0.4 10*3/uL (ref 0.1–1.0)
Monocytes Relative: 10.3 % (ref 3.0–12.0)
Neutro Abs: 1.5 10*3/uL (ref 1.4–7.7)
Neutrophils Relative %: 40 % — ABNORMAL LOW (ref 43.0–77.0)
Platelets: 269 10*3/uL (ref 150.0–400.0)
RBC: 4.24 Mil/uL (ref 3.87–5.11)
RDW: 14.5 % (ref 11.5–15.5)
WBC: 3.8 10*3/uL — ABNORMAL LOW (ref 4.0–10.5)

## 2021-09-21 LAB — LIPID PANEL
Cholesterol: 216 mg/dL — ABNORMAL HIGH (ref 0–200)
HDL: 78.3 mg/dL (ref >39.00)
LDL Cholesterol: 126 mg/dL — ABNORMAL HIGH (ref 0–99)
NonHDL: 137.9
Total CHOL/HDL Ratio: 3
Triglycerides: 60 mg/dL (ref 0.0–149.0)
VLDL: 12 mg/dL (ref 0.0–40.0)

## 2021-09-21 LAB — IRON: Iron: 54 ug/dL (ref 42–145)

## 2021-09-21 LAB — TSH: TSH: 1.5 u[IU]/mL (ref 0.35–5.50)

## 2021-09-27 ENCOUNTER — Ambulatory Visit (INDEPENDENT_AMBULATORY_CARE_PROVIDER_SITE_OTHER): Payer: BC Managed Care – PPO | Admitting: Family Medicine

## 2021-09-27 ENCOUNTER — Encounter: Payer: Self-pay | Admitting: Family Medicine

## 2021-09-27 VITALS — BP 108/70 | HR 68 | Temp 98.0°F | Ht 62.25 in | Wt 177.5 lb

## 2021-09-27 DIAGNOSIS — E7800 Pure hypercholesterolemia, unspecified: Secondary | ICD-10-CM | POA: Diagnosis not present

## 2021-09-27 DIAGNOSIS — E6609 Other obesity due to excess calories: Secondary | ICD-10-CM | POA: Diagnosis not present

## 2021-09-27 DIAGNOSIS — Z0000 Encounter for general adult medical examination without abnormal findings: Secondary | ICD-10-CM

## 2021-09-27 DIAGNOSIS — E611 Iron deficiency: Secondary | ICD-10-CM

## 2021-09-27 DIAGNOSIS — Z6832 Body mass index (BMI) 32.0-32.9, adult: Secondary | ICD-10-CM

## 2021-09-27 NOTE — Assessment & Plan Note (Signed)
Pt averages dosing niferex 150 mg several times per week  ?Stable cbc  ?Lab Results  ?Component Value Date  ? IRON 54 09/21/2021  ? FERRITIN 118 01/16/2013  ?  ?Reassuring  ?Will continue current plan ?

## 2021-09-27 NOTE — Assessment & Plan Note (Addendum)
Reviewed health habits including diet and exercise and skin cancer prevention ?Reviewed appropriate screening tests for age  ?Also reviewed health mt list, fam hx and immunization status , as well as social and family history   ?See HPI ?Labs reviewed  ?Planning on 2nd shingrix vaccine  ?Will get flu shot in the fall ?Pap utd, planning gyn office visit  ?Colonoscopy due in feb 2023, need to work on referral ?Mammogram utd, is planned in May in setting of breast cancer  ? ?

## 2021-09-27 NOTE — Patient Instructions (Addendum)
Ozempic- is a diabetes medicine that helps with weight loss  ?Most likely won't be covered but you can find out with your insurance  ?It increases risk of thyroid cancer  ? ?Try to get most of your carbohydrates from produce (with the exception of white potatoes)  ?Eat less bread/pasta/rice/snack foods/cereals/sweets and other items from the middle of the grocery store (processed carbs) ?Aim for at least 30 minutes of exercise 5 days per week  ? ?Find out if your insurance covers the shinrix vaccine here or in a pharmacy and get it updated  ? ?For cholesterol  ?Avoid red meat/ fried foods/ egg yolks/ fatty breakfast meats/ butter, cheese and high fat dairy/ and shellfish   ? ?Take your iron several days per week  ? ?Take care of yourself  ? ?Labs look stable  ? ? ? ? ? ? ? ? ?

## 2021-09-27 NOTE — Progress Notes (Signed)
? ?Subjective:  ? ? Patient ID: Jamie Shaffer, female    DOB: [birth year], 60 y.o.   MRN: [id number] ? ?HPI ?Here for health maintenance exam and to review chronic medical problems   ? ?Wt Readings from Last 3 Encounters:  ?09/27/21 177 lb 8 oz (80.5 kg)  ?11/23/20 176 lb 8 oz (80.1 kg)  ?10/04/20 177 lb (80.3 kg)  ? ?32.20 kg/m? 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 ?Has really struggled with her weight  ?Hard to stay motivated ?Less time for exercise with your job  ?Plans to start going to the gym very early in the am  ? ? ?Zoster status : she took the first shingrix , has not had the 2nd  ?Tdap 2012 ?Flu shot : had in oct  ? ?Immunization History  ?Administered Date(s) Administered  ? Influenza Split 03/04/2012, 04/12/2020  ? Influenza, Seasonal, Injecte, Preservative Fre 03/20/2016  ? Influenza,trivalent, recombinat, inj, PF 03/30/2015  ? Moderna Sars-Covid-2 Vaccination 08/06/2019, 09/03/2019, 05/10/2020  ? Td 06/18/1996, 10/20/2007  ? Tdap 02/13/2011  ? Zoster Recombinat (Shingrix) 02/11/2019  ? ? ? ?Pap 05/2018 gyn ?Has not been this year, is scheduled to go  ? ?Colonoscopy 07/2016- due in feb  ?Fam h/o colon cancer  ? ?Mammogram 10/2020, had neg bx of her scar site  ?Personal h/o breast cancer  ?Takes arimidex - harder to loose weight  ?Has it planned for next month ?Fam h/o breast cancer ?Self breast exam: no lumps or changes  ?Stays tender under bx site  ? ?Past iron deficiency ?Lab Results  ?Component Value Date  ? WBC 3.8 (L) 09/21/2021  ? HGB 11.5 (L) 09/21/2021  ? HCT 35.8 (L) 09/21/2021  ? MCV 84.3 09/21/2021  ? PLT 269.0 09/21/2021  ? ?Was on niferex  ?Lab Results  ?Component Value Date  ? IRON 54 09/21/2021  ? FERRITIN 118 01/16/2013  ? ?Takes iron off and on  ? ?Cholesterol  ?Lab Results  ?Component Value Date  ? CHOL 216 (H) 09/21/2021  ? CHOL 215 (H) 08/26/2020  ? CHOL 210 (H) 01/27/2019  ? ?Lab Results  ?Component Value Date  ? HDL 78.30  09/21/2021  ? HDL 79.40 08/26/2020  ? HDL 75.80 01/27/2019  ? ?Lab Results  ?Component Value Date  ? LDLCALC 126 (H) 09/21/2021  ? LDLCALC 122 (H) 08/26/2020  ? LDLCALC 116 (H) 01/27/2019  ? ?Lab Results  ?Component Value Date  ? TRIG 60.0 09/21/2021  ? TRIG 70.0 08/26/2020  ? TRIG 91.0 01/27/2019  ? ?Lab Results  ?Component Value Date  ? CHOLHDL 3 09/21/2021  ? CHOLHDL 3 08/26/2020  ? CHOLHDL 3 01/27/2019  ? ?Lab Results  ?Component Value Date  ? LDLDIRECT 93.0 02/26/2012  ? ?Eats healthy  ?Not a lot of beef  ? ?Lab Results  ?Component Value Date  ? CREATININE 0.66 09/21/2021  ? BUN 18 09/21/2021  ? NA 140 09/21/2021  ? K 4.4 09/21/2021  ? CL 104 09/21/2021  ? CO2 29 09/21/2021  ? ?Lab Results  ?Component Value Date  ? ALT 32 09/21/2021  ? AST 24 09/21/2021  ? ALKPHOS 94 09/21/2021  ? BILITOT 0.5 09/21/2021  ? ?Lab Results  ?Component Value Date  ? TSH 1.50 09/21/2021  ? ?Patient Active Problem List  ? Diagnosis Date Noted  ? Elevated LDL cholesterol level 10/04/2020  ? Iron deficiency 09/20/2020  ? History of breast cancer 09/20/2020  ? Breast cancer of  upper-outer quadrant of left female breast ([place]) 10/24/2015  ? Genetic testing 10/24/2015  ? Family history of breast cancer in first degree relative 10/04/2015  ? Family history of colon cancer 10/04/2015  ? Obesity 09/20/2014  ? Menopausal symptoms 01/16/2013  ? Routine general medical examination at a health care facility 02/04/2011  ? FIBROCYSTIC BREAST DISEASE 10/20/2007  ? PANIC DISORDER 10/17/2007  ? ?Past Medical History:  ?Diagnosis Date  ? Cancer [facility]) 10-2015  ? left breast/last radiation tx08/17/ no chemo  ? Fibrocystic breast   ? GERD (gastroesophageal reflux disease)   ? Panic disorder   ? Personal history of radiation therapy   ? Post-menopausal   ? ?Past Surgical History:  ?Procedure Laterality Date  ? BREAST BIOPSY Left 09/28/2015  ? BREAST LUMPECTOMY Left 11/02/2015  ? BREAST LUMPECTOMY WITH RADIOACTIVE SEED AND SENTINEL LYMPH NODE BIOPSY Left  11/02/2015  ? Procedure: BREAST LUMPECTOMY WITH RADIOACTIVE SEED AND SENTINEL LYMPH NODE BIOPSY;  Surgeon: [name], MD;  Location: [place];  Service: General;  Laterality: Left;  ? CESAREAN SECTION    ? fibroid tumor    ? PILONIDAL CYST EXCISION    ? VAGINAL HYSTERECTOMY    ? partial  ? ?Social History  ? ?Tobacco Use  ? Smoking status: Never  ? Smokeless tobacco: Never  ? Tobacco comments:  ?  tried smoking as a teenager, but no continued use  ?Substance Use Topics  ? Alcohol use: No  ? Drug use: No  ? ?Family History  ?Problem Relation Age of Onset  ? Hypertension Mother   ? Diabetes Mother   ? Heart failure Mother 14  ? Breast cancer Mother 57  ?     +lump  ? Breast cancer Sister   ?     dx. 40s; s/p mastectomy  ? Breast cancer Sister 61  ?     s/p lump; negative genetic testing  ? Fibroids Sister   ? Heart Problems Father   ?     valve replacement  ? Kidney failure Father 86  ? Prostate cancer Father   ?     dx. later age; did not cause his death  ? Breast cancer Sister 63  ?     "cancerous cells"; s/p radiation  ? Fibroids Sister   ? Fibroids Sister   ? Bladder Cancer Brother 51  ?     smoker; had issues for 7-8 yrs before going to the doctor  ? Colon cancer Maternal Aunt   ?     dx. 9s  ? Heart Problems Maternal Uncle   ? Lung cancer Brother   ?     s/p surgery to remove nodule?; not a smoker; dx. 61-62  ? Alzheimer's disease Maternal Aunt   ?     d. 4  ? Colon cancer Maternal Uncle   ?     dx. in 37s or younger  ? Lung cancer Cousin   ?     maternal 1st cousin dx. 59s; smoker  ? Prostate cancer Cousin   ?     maternal 1st cousin dx. early 41s  ? Melanoma Cousin   ?     maternal 1st cousin dx. 70s  ? Breast cancer Cousin 38  ?     maternal 1st cousin   ? Kidney cancer Cousin   ?     maternal 1st cousin dx. 95s  ? Bladder Cancer Paternal Uncle   ?     dx. late  70s-80s  ? Aneurysm Maternal Grandmother   ?     brain; d. 35s  ? Colon cancer Maternal Grandfather   ?     d. 70s-50s  ? Heart  disease Paternal Grandmother   ?     d. 35s  ? Heart attack Paternal Grandmother   ? Heart disease Paternal Grandfather   ?     d. 36s  ? Heart attack Paternal Grandfather   ? Breast cancer Cousin   ?     paternal 1st cousin dx. 61s  ? Cancer Cousin   ?     (x2) paternal 1st cousins d. unspecified cancers at unspecified ages  ? ?No Known Allergies ?Current Outpatient Medications on File Prior to Visit  ?Medication Sig Dispense Refill  ? ALPRAZolam (XANAX) 0.5 MG tablet Take 1 tablet (0.5 mg total) by mouth 2 (two) times daily as needed for anxiety or sleep. 10 tablet 0  ? anastrozole (ARIMIDEX) 1 MG tablet Take 1 tablet (1 mg total) by mouth daily. 90 tablet 3  ? Calcium Carbonate-Vitamin D (CALTRATE 600+D PO) Take by mouth daily.    ? Multiple Vitamin (MULTIVITAMIN) tablet Take 1 tablet by mouth daily.    ? ?No current facility-administered medications on file prior to visit.  ?  ? ?Review of Systems  ?Constitutional:  Positive for fatigue. Negative for activity change, appetite change, fever and unexpected weight change.  ?HENT:  Negative for congestion, ear pain, rhinorrhea, sinus pressure and sore throat.   ?Eyes:  Negative for pain, redness and visual disturbance.  ?Respiratory:  Negative for cough, shortness of breath and wheezing.   ?Cardiovascular:  Negative for chest pain and palpitations.  ?Gastrointestinal:  Negative for abdominal pain, blood in stool, constipation and diarrhea.  ?Endocrine: Negative for polydipsia and polyuria.  ?Genitourinary:  Negative for dysuria, frequency and urgency.  ?Musculoskeletal:  Negative for arthralgias, back pain and myalgias.  ?Skin:  Negative for pallor and rash.  ?Allergic/Immunologic: Negative for environmental allergies.  ?Neurological:  Negative for dizziness, syncope and headaches.  ?Hematological:  Negative for adenopathy. Does not bruise/bleed easily.  ?Psychiatric/Behavioral:  Negative for decreased concentration and dysphoric mood. The patient is not  nervous/anxious.   ? ?   ?Objective:  ? Physical Exam ?Constitutional:   ?   General: She is not in acute distress. ?   Appearance: Normal appearance. She is well-developed. She is not ill-appearing or diaphoretic.  ?HENT:

## 2021-09-27 NOTE — Assessment & Plan Note (Signed)
Disc goals for lipids and reasons to control them ?Rev last labs with pt ?Rev low sat fat diet in detail ?Stable labs with HDL in high 70s  ?Will continue to monitor  ?

## 2021-09-27 NOTE — Assessment & Plan Note (Signed)
Discussed how this problem influences overall health and the risks it imposes  ?Reviewed plan for weight loss with lower calorie diet (via better food choices and also portion control or program like weight watchers) and exercise building up to or more than 30 minutes 5 days per week including some aerobic activity  ? ?More challenging with arimidex  ?Encouraged healthy balanced diet  ?

## 2021-10-30 ENCOUNTER — Telehealth: Payer: Self-pay | Admitting: Hematology and Oncology

## 2021-10-30 NOTE — Telephone Encounter (Signed)
Rescheduled appointment per provider PAL. Patient is aware of the changes made to her upcoming appointment. 

## 2021-11-06 NOTE — Progress Notes (Signed)
Patient Care Team: Tower, [name], MD as PCP - General  DIAGNOSIS:  Encounter Diagnosis  Name Primary?   Malignant neoplasm of upper-outer quadrant of left breast in female, estrogen receptor positive ([place])     SUMMARY OF ONCOLOGIC HISTORY: Oncology History  Breast cancer of upper-outer quadrant of left female breast ([place])  09/28/2015 Initial Diagnosis   Left breast biopsy 2:00 position: IDC with DCIS, grade 1, ER 100%, PR 20%, Ki-67 10%, HER-2 negative ratio 1.32, MRI revealed 7 x 5 x 10 mm in the posterior third left breast, T1 cN0 stage IA clinical stage    10/04/2015 Genetic Testing   There were no deleterious mutations or a variants of uncertain significance (VUSes) identified.  Genes tested: APC, ATM, AXIN2, BARD1, BMPR1A, BRCA1, BRCA2, BRIP1, CDH1, CDK4, CDKN2A, CHEK2, EPCAM, FANCC, MLH1, MSH2 (with MSH2 Exons 1-7 Inversion Analysis), MSH6, MUTYH, NBN, PALB2, PMS2, POLD1, POLE, PTEN, RAD51C, RAD51D, SCG5/GREM1, SMAD4, STK11, TP53, VHL, and XRCC2.    11/02/2015 Surgery   Left lumpectomy ([place]): IDC with DCIS, 1 cm, margins negative, 1/3 lymph nodes positive, ER 100%, PR 20%, Ki-67 10%, HER-2 negative ratio 1.32, T1bN1 stage IIA, Mammaprint luminal-type A, low risk   12/07/2015 - 02/03/2016 Radiation Therapy   Radiation ([place]): Left breast/left supraclav: 50.4Gy, 28 fractions, left axilla: 12.32 Gy, 7 fractions, left breast boost: 14.4 Gy in 8 fractions.      02/07/2016 -  Anti-estrogen oral therapy   Anastrozole 30m daily     CHIEF COMPLIANT: Follow-up of left breast cancer on anastrozole therapy    INTERVAL HISTORY: Jamie Shaffer a 61y.o. with above-mentioned history of left breast cancer treated with lumpectomy, radiation, and who is currently on anastrozole.She presents to the clinic today for annual follow-up. States that she has some manageable hot flashes. Denies joint stiffness. Denies pain and discomfort in the breast.     ALLERGIES:  has No Known  Allergies.  MEDICATIONS:  Current Outpatient Medications  Medication Sig Dispense Refill   ALPRAZolam (XANAX) 0.5 MG tablet Take 1 tablet (0.5 mg total) by mouth 2 (two) times daily as needed for anxiety or sleep. 10 tablet 0   anastrozole (ARIMIDEX) 1 MG tablet Take 1 tablet (1 mg total) by mouth daily. 90 tablet 3   Calcium Carbonate-Vitamin D (CALTRATE 600+D PO) Take by mouth daily.     Multiple Vitamin (MULTIVITAMIN) tablet Take 1 tablet by mouth daily.     No current facility-administered medications for this visit.    PHYSICAL EXAMINATION: ECOG PERFORMANCE STATUS: 1 - Symptomatic but completely ambulatory  Vitals:   11/16/21 0904  BP: (!) 142/85  Pulse: 69  Resp: 15  Temp: (!) 97.5 F (36.4 C)  SpO2: 100%   Filed Weights   11/16/21 0904  Weight: 177 lb (80.3 kg)    BREAST: Scar tissue in the left axillary area is slightly tender to palpation with slight subcu nodularity from scar tissue underneath it.  No palpable lumps or nodules in the right breast or axilla (exam performed in the presence of a chaperone)  LABORATORY DATA:  I have reviewed the data as listed    Latest Ref Rng & Units 09/21/2021    7:42 AM 08/26/2020    7:42 AM 01/27/2019    7:35 AM  CMP  Glucose 70 - 99 mg/dL 95   94   98    BUN 6 - 23 mg/dL _0 Creatinine 0.40 - 1.20  mg/dL 0.66   0.76   0.75    Sodium 135 - 145 mEq/L 140   140   139    Potassium 3.5 - 5.1 mEq/L 4.4   4.3   4.3    Chloride 96 - 112 mEq/L 104   105   103    CO2 19 - 32 mEq/L _0 Calcium 8.4 - 10.5 mg/dL 9.6   9.7   9.9    Total Protein 6.0 - 8.3 g/dL 6.7   7.0   7.6    Total Bilirubin 0.2 - 1.2 mg/dL 0.5   0.5   0.5    Alkaline Phos 39 - 117 U/L 94   100   93    AST 0 - 37 U/L _1 ALT 0 - 35 U/L 32   31   25      Lab Results  Component Value Date   WBC 3.8 (L) 09/21/2021   HGB 11.5 (L) 09/21/2021   HCT 35.8 (L) 09/21/2021   MCV 84.3 09/21/2021   PLT 269.0 09/21/2021   NEUTROABS 1.5  09/21/2021    ASSESSMENT & PLAN:  Breast cancer of upper-outer quadrant of left female breast ([place]) Left lumpectomy: IDC with DCIS, 1 cm, margins negative, 1/3 lymph nodes positive, ER 100%, PR 20%, Ki-67 10%, HER-2 negative ratio 1.32, T1bN1 stage II A Mammaprint: Luminal type A, low risk, did not need chemotherapy. Adjuvant radiation therapy from 11/28/2015 to 02/03/2016   CurrentTreatment : Adjuvant antiestrogen therapy With anastrozole 1 mg daily started 02/07/16 (plan for 7 years)   Anastrozole Toxicities: 1.  Hot flashes: Mild to moderate 2. hair thinning  Denies any joint pains or arthritis   Surveillance: 1. Breast exam 11/16/21:Benign  2. Mammogram 11/14/2021:Benign,  breast density category C  Return to clinic in 1 year for follow-up    No orders of the defined types were placed in this encounter.  The patient has a good understanding of the overall plan. she agrees with it. she will call with any problems that may develop before the next visit here. Total time spent: 30 mins including face to face time and time spent for planning, charting and co-ordination of care   Harriette Ohara, MD 11/16/21    I Gardiner Coins am scribing for Dr. Lindi Adie  I have reviewed the above documentation for accuracy and completeness, and I agree with the above.

## 2021-11-14 ENCOUNTER — Ambulatory Visit
Admission: RE | Admit: 2021-11-14 | Discharge: 2021-11-14 | Disposition: A | Discharge status: Home / Self Care | Payer: BC Managed Care – PPO | Source: Ambulatory Visit | Attending: Hematology and Oncology | Admitting: Hematology and Oncology

## 2021-11-14 DIAGNOSIS — Z1231 Encounter for screening mammogram for malignant neoplasm of breast: Secondary | ICD-10-CM

## 2021-11-16 ENCOUNTER — Other Ambulatory Visit: Payer: Self-pay

## 2021-11-16 ENCOUNTER — Inpatient Hospital Stay: Payer: BC Managed Care – PPO | Attending: Hematology and Oncology | Admitting: Hematology and Oncology

## 2021-11-16 DIAGNOSIS — C773 Secondary and unspecified malignant neoplasm of axilla and upper limb lymph nodes: Secondary | ICD-10-CM | POA: Insufficient documentation

## 2021-11-16 DIAGNOSIS — Z79811 Long term (current) use of aromatase inhibitors: Secondary | ICD-10-CM | POA: Insufficient documentation

## 2021-11-16 DIAGNOSIS — N951 Menopausal and female climacteric states: Secondary | ICD-10-CM | POA: Insufficient documentation

## 2021-11-16 DIAGNOSIS — C50412 Malignant neoplasm of upper-outer quadrant of left female breast: Secondary | ICD-10-CM | POA: Insufficient documentation

## 2021-11-16 DIAGNOSIS — Z170 Estrogen receptor positive status [ER+]: Secondary | ICD-10-CM | POA: Insufficient documentation

## 2021-11-16 DIAGNOSIS — Z923 Personal history of irradiation: Secondary | ICD-10-CM | POA: Diagnosis not present

## 2021-11-16 MED ORDER — ANASTROZOLE 1 MG PO TABS: 1.0000 mg | ORAL_TABLET | Freq: Every day | ORAL | 3 refills | Status: DC

## 2021-11-16 NOTE — Assessment & Plan Note (Addendum)
Left lumpectomy: IDC with DCIS, 1 cm, margins negative, 1/3 lymph nodes positive, ER 100%, PR 20%, Ki-67 10%, HER-2 negative ratio 1.32, T1bN1 stage II A Mammaprint: Luminal type A, low risk, did not need chemotherapy. Adjuvant radiation therapy from 11/28/2015 to08/18/2017  CurrentTreatment: Adjuvant antiestrogen therapy With anastrozole 1 mg daily started 02/07/16 (plan for 7 years)  Anastrozole Toxicities: 1.  Hot flashes: Mild to moderate 2. hair thinning  Denies any joint pains or arthritis  Surveillance: 1.Breast exam6/1/23:Benign 2.Mammogram5/30/2023:Benign,breast density category C  Return to clinic in 1 year for follow-up

## 2021-11-23 ENCOUNTER — Ambulatory Visit: Payer: BC Managed Care – PPO | Admitting: Hematology and Oncology

## 2022-01-17 DIAGNOSIS — Z01419 Encounter for gynecological examination (general) (routine) without abnormal findings: Secondary | ICD-10-CM | POA: Diagnosis not present

## 2022-01-17 DIAGNOSIS — Z13820 Encounter for screening for osteoporosis: Secondary | ICD-10-CM | POA: Diagnosis not present

## 2022-01-17 DIAGNOSIS — Z6831 Body mass index (BMI) 31.0-31.9, adult: Secondary | ICD-10-CM | POA: Diagnosis not present

## 2022-01-17 LAB — HM DEXA SCAN: HM Dexa Scan: NORMAL

## 2022-01-24 ENCOUNTER — Encounter (INDEPENDENT_AMBULATORY_CARE_PROVIDER_SITE_OTHER): Payer: Self-pay

## 2022-04-23 ENCOUNTER — Telehealth: Payer: Self-pay | Admitting: Family Medicine

## 2022-04-23 MED ORDER — COVID-19 AT HOME ANTIGEN TEST VI KIT: PACK | 0 refills | Status: DC

## 2022-04-23 NOTE — Telephone Encounter (Signed)
Pt called asking if a order for a covid test be sent in to pharmacy? [facility] [address] is preferred pharmacy. Call back # [phone]

## 2022-04-23 NOTE — Telephone Encounter (Signed)
I sent it   Please keep [place] posted re: symptoms and test result if pos

## 2022-04-23 NOTE — Telephone Encounter (Signed)
Does she mean a pcr?  What does she mean?   Thanks

## 2022-04-23 NOTE — Telephone Encounter (Signed)
Spoke with pt and she was a little sick over the weekend, she doesn't think she has covid and she's not sick enough to need a virtual visit. Pt said she just wanted to get an at home covid test to make sure she doesn't have covid before she goes back to work tomorrow. Pt said [facility] advised her if we send a Rx for the at home test pt can use her flex card to get it and will not have to pay for the test. [facility] [address]

## 2022-04-25 ENCOUNTER — Telehealth: Payer: Self-pay | Admitting: Family Medicine

## 2022-04-25 NOTE — Telephone Encounter (Signed)
Patient called in and was wanting to know if you could call her. She stated she had some questions about recommendations for medications she can take form OTC. Thank you!

## 2022-04-26 ENCOUNTER — Ambulatory Visit: Payer: BC Managed Care – PPO | Admitting: Internal Medicine

## 2022-04-26 ENCOUNTER — Encounter: Payer: Self-pay | Admitting: Internal Medicine

## 2022-04-26 VITALS — BP 110/60 | HR 60 | Temp 97.4°F | Ht 62.25 in | Wt 179.0 lb

## 2022-04-26 DIAGNOSIS — J069 Acute upper respiratory infection, unspecified: Secondary | ICD-10-CM | POA: Insufficient documentation

## 2022-04-26 MED ORDER — BENZONATATE 200 MG PO CAPS: 200.0000 mg | ORAL_CAPSULE | Freq: Three times a day (TID) | ORAL | 0 refills | Status: DC | PRN

## 2022-04-26 NOTE — Progress Notes (Signed)
Subjective:    Patient ID: Jamie Shaffer, female    DOB: 28-Mar-1961, 61 y.o.   MRN: [id number]  HPI Here due to respiratory symptoms  Started 4-5 days ago Voice left and throat feels tight Coughing--worse at night (brief relief with delsym) Tried ricola drops Mucinex DM also helped slightly Some headache and ear stuffiness  Went to work yesterday--felt worse after (from talking a lot) No fever Slight chills--no sweats (other than usual hormonal) No SOB Slight sputum Headache is frontal and some jaw ache  Current Outpatient Medications on File Prior to Visit  Medication Sig Dispense Refill   ALPRAZolam (XANAX) 0.5 MG tablet Take 1 tablet (0.5 mg total) by mouth 2 (two) times daily as needed for anxiety or sleep. 10 tablet 0   anastrozole (ARIMIDEX) 1 MG tablet Take 1 tablet (1 mg total) by mouth daily. 90 tablet 3   Calcium Carbonate-Vitamin D (CALTRATE 600+D PO) Take by mouth daily.     Multiple Vitamin (MULTIVITAMIN) tablet Take 1 tablet by mouth daily.     No current facility-administered medications on file prior to visit.    No Known Allergies  Past Medical History:  Diagnosis Date   Cancer ([place]) 10-2015   left breast/last radiation tx08/17/ no chemo   Fibrocystic breast    GERD (gastroesophageal reflux disease)    Panic disorder    Personal history of radiation therapy    Post-menopausal     Past Surgical History:  Procedure Laterality Date   BREAST BIOPSY Left 09/28/2015   BREAST LUMPECTOMY Left 11/02/2015   BREAST LUMPECTOMY WITH RADIOACTIVE SEED AND SENTINEL LYMPH NODE BIOPSY Left 11/02/2015   Procedure: BREAST LUMPECTOMY WITH RADIOACTIVE SEED AND SENTINEL LYMPH NODE BIOPSY;  Surgeon: [name], MD;  Location: [place];  Service: General;  Laterality: Left;   CESAREAN SECTION     fibroid tumor     PILONIDAL CYST EXCISION     VAGINAL HYSTERECTOMY     partial    Family History  Problem Relation Age of Onset   Hypertension  Mother    Diabetes Mother    Heart failure Mother 29   Breast cancer Mother 25       +lump   Breast cancer Sister        dx. 31s; s/p mastectomy   Breast cancer Sister 75       s/p lump; negative genetic testing   Fibroids Sister    Heart Problems Father        valve replacement   Kidney failure Father 60   Prostate cancer Father        dx. later age; did not cause his death   Breast cancer Sister 43       "cancerous cells"; s/p radiation   Fibroids Sister    Fibroids Sister    Bladder Cancer Brother 8       smoker; had issues for 7-8 yrs before going to the doctor   Colon cancer Maternal Aunt        dx. 70s   Heart Problems Maternal Uncle    Lung cancer Brother        s/p surgery to remove nodule?; not a smoker; dx. 61-62   Alzheimer's disease Maternal Aunt        d. 77   Colon cancer Maternal Uncle        dx. in 24s or younger   Lung cancer Cousin        maternal 1st cousin  dx. 83s; smoker   Prostate cancer Cousin        maternal 1st cousin dx. early 80s   Melanoma Cousin        maternal 1st cousin dx. 64s   Breast cancer Cousin 63       maternal 1st cousin    Kidney cancer Cousin        maternal 1st cousin dx. 48s   Bladder Cancer Paternal Uncle        dx. late 70s-80s   Aneurysm Maternal Grandmother        brain; d. 58s   Colon cancer Maternal Grandfather        d. 40s-50s   Heart disease Paternal Grandmother        d. 42s   Heart attack Paternal Grandmother    Heart disease Paternal Grandfather        d. 73s   Heart attack Paternal Grandfather    Breast cancer Cousin        paternal 1st cousin dx. 43s   Cancer Cousin        (x2) paternal 1st cousins d. unspecified cancers at unspecified ages    Social History   Socioeconomic History   Marital status: Married    Spouse name: Not on file   Number of children: 2   Years of education: Not on file   Highest education level: Not on file  Occupational History   Occupation: IT consultant time    Employer: [email or web address]  Tobacco Use   Smoking status: Never   Smokeless tobacco: Never   Tobacco comments:    tried smoking as a teenager, but no continued use  Substance and Sexual Activity   Alcohol use: No   Drug use: No   Sexual activity: Not Currently  Other Topics Concern   Not on file  Social History Narrative   Exercise regularly at the gym   Social Determinants of Health   Financial Resource Strain: Not on file  Food Insecurity: Not on file  Transportation Needs: Not on file  Physical Activity: Not on file  Stress: Not on file  Social Connections: Not on file  Intimate Partner Violence: Not on file   Review of Systems No allergies No sinus issues generally No N/V Eating okay    Objective:   Physical Exam Constitutional:      Appearance: Normal appearance.     Comments: Slight hoarseness  HENT:     Head:     Comments: Slight frontal tenderness    Nose: No congestion.     Mouth/Throat:     Pharynx: No oropharyngeal exudate or posterior oropharyngeal erythema.  Pulmonary:     Effort: Pulmonary effort is normal.     Breath sounds: Normal breath sounds. No wheezing or rales.  Musculoskeletal:     Cervical back: Neck supple.  Lymphadenopathy:     Cervical: No cervical adenopathy.  Neurological:     Mental Status: She is alert.            Assessment & Plan:

## 2022-04-26 NOTE — Assessment & Plan Note (Signed)
Seems to be viral infection and hopefully self limited Discussed analgesics Rx---benzonatate Continue ricola/delsym prn If worsens next week, would try empiric amoxil

## 2022-04-26 NOTE — Telephone Encounter (Signed)
Pt had a virtual visit with Dr. Silvio Pate today, issues addressed

## 2022-04-30 ENCOUNTER — Telehealth: Payer: Self-pay | Admitting: Family Medicine

## 2022-04-30 MED ORDER — AMOXICILLIN 500 MG PO TABS: 1000.0000 mg | ORAL_TABLET | Freq: Two times a day (BID) | ORAL | 0 refills | Status: AC

## 2022-04-30 NOTE — Telephone Encounter (Signed)
Patient called in and stated that she seen Dr. Silvio Pate on Thursday to see Dr. Silvio Pate. She stated he said if she wasn't feeling any better to give him a call. She stated she is still coughing and not feeling any better. Please advise. Thank you!

## 2022-04-30 NOTE — Telephone Encounter (Signed)
Spoke to pt

## 2022-04-30 NOTE — Telephone Encounter (Signed)
Please let her know I sent the antibiotic like we discussed

## 2022-04-30 NOTE — Addendum Note (Signed)
Addended by: Viviana Simpler I on: 04/30/2022 11:35 AM   Modules accepted: Orders

## 2022-06-29 ENCOUNTER — Encounter: Payer: Self-pay | Admitting: Gastroenterology

## 2022-07-05 ENCOUNTER — Telehealth: Payer: Self-pay | Admitting: Family Medicine

## 2022-07-05 NOTE — Telephone Encounter (Signed)
Patient called in and stated that at her last appointment her and Dr. Glori Bickers talked about her starting Ozempic. She had some questions regarding this and if she can still start it. She can be reached at [phone]. Thank you!

## 2022-07-06 NOTE — Telephone Encounter (Signed)
It is very difficult to get right now (sold out everywhere and I expect it will take a while to find it)  Most insurance co do not pay if for weight loss only so she will have to check on that  When used for weight loss the brand is Wegovy (instead of ozempic)  Please see what her questions are

## 2022-07-09 NOTE — Telephone Encounter (Signed)
Left message to return call to our office.  

## 2022-07-10 NOTE — Telephone Encounter (Signed)
Pt notified of Dr. Tower's comments and verbalized understanding  

## 2022-07-10 NOTE — Telephone Encounter (Signed)
A month won't do it- is more long term than that  Can cause nausea-that is common   If she finds out her insurance would cover it then follow up in office before prescribing - we can discuss in more detail including risks;potential side effects and discuss how it works   Administrator, Civil Service

## 2022-07-10 NOTE — Telephone Encounter (Signed)
Pt notified of Dr. Marliss Coots comments. Pt had a few questions she wanted to ask PCP.  1st pt will call her insurance and see if it's covered. I did advise pt she has to specify it's for weight loss not DM, because that does make a difference.  2nd. Pt said is there any side effs of med she needs to be aware of. Someone told her it will make her nauseous and she wants to know if that's true.  3rd. Someone also told her that it will help improve her cholesterol so she wants to see if the med will help cholesterol directly or is it just because you lose weight you cholesterol may improve.  4th once pt found out its a weekly injection she said she doesn't know how she feels about that and thinks she only would use if for a month and that's it. I advise her I'm not sure if only using it for a month would be affective but she wanted me to see what PCP thought about it.

## 2022-07-24 ENCOUNTER — Telehealth: Payer: Self-pay | Admitting: Family Medicine

## 2022-07-24 NOTE — Telephone Encounter (Signed)
Patient has been scheduled.

## 2022-07-24 NOTE — Telephone Encounter (Signed)
Pt was advised in separate phone note, if she wants to start med she needs an appt, please call pt and schedule an appt to discuss starting this med

## 2022-07-24 NOTE — Telephone Encounter (Signed)
Patient would like a phone call regarding wegovy medication.She states that insurance company is needing the Dr to call the pharmacy with the dosage. Pharmacy Prior authorization# :[id number]

## 2022-07-25 ENCOUNTER — Other Ambulatory Visit: Payer: Self-pay | Admitting: Family Medicine

## 2022-07-25 ENCOUNTER — Ambulatory Visit (AMBULATORY_SURGERY_CENTER): Payer: No Typology Code available for payment source | Admitting: *Deleted

## 2022-07-25 ENCOUNTER — Encounter: Payer: Self-pay | Admitting: Family Medicine

## 2022-07-25 ENCOUNTER — Ambulatory Visit: Payer: No Typology Code available for payment source | Admitting: Family Medicine

## 2022-07-25 VITALS — BP 135/77 | HR 75 | Temp 98.0°F | Ht 63.0 in | Wt 181.5 lb

## 2022-07-25 VITALS — Ht 63.0 in | Wt 176.0 lb

## 2022-07-25 DIAGNOSIS — Z800 Family history of malignant neoplasm of digestive organs: Secondary | ICD-10-CM

## 2022-07-25 DIAGNOSIS — E6609 Other obesity due to excess calories: Secondary | ICD-10-CM

## 2022-07-25 DIAGNOSIS — Z6832 Body mass index (BMI) 32.0-32.9, adult: Secondary | ICD-10-CM | POA: Diagnosis not present

## 2022-07-25 MED ORDER — WEGOVY 0.25 MG/0.5ML ~~LOC~~ SOAJ: 0.2500 mg | SUBCUTANEOUS | 0 refills | Status: DC

## 2022-07-25 MED ORDER — NA SULFATE-K SULFATE-MG SULF 17.5-3.13-1.6 GM/177ML PO SOLN: 1.0000 | Freq: Once | ORAL | 0 refills | Status: AC

## 2022-07-25 NOTE — Telephone Encounter (Signed)
**  SEE NOTE ON RX, IT SAYS PA REQUIRED FOR WEGOVY

## 2022-07-25 NOTE — Assessment & Plan Note (Addendum)
Discussed barriers to weight loss effort and reviewed her chart Arimidex does make it more difficult Also cravings and non hunger eating habits , having to cook for family who demands higher calorie food  Disc strategies and need for family meeting Recommend she consider NOOM program to investigate her mal adaptive eating habits and learn about healthier eating and exercise  (Enc her to keep up good exercise) Consider lower glycemic diet choices  Interested in semaglutide if affordable and available Disc option of GLP medication including possible side effects like GI intolerance and risk of thyroid and endocrine cancer, pancreatitis and gallstones, kidney problems and diabetic retinopathy Px for wegovy 0.25 mg sent to pharmacy to try  Will plan f/u once we know status  Inst to call if side eff and discussed expectations If helpful would inc dose montlhy   31 Minutes were spent today both face to face and in the chart obtaining history, reviewing records and test results, performing exam , educating and discussing treatment options, and placing orders

## 2022-07-25 NOTE — Patient Instructions (Addendum)
Check out the [place] program on line for weight control  See if this is affordable / do able for you   Have a family meeting about cooking and what you are eating   Try to get most of your carbohydrates from produce (with the exception of white potatoes)  Eat less bread/pasta/rice/snack foods/cereals/sweets and other items from the middle of the grocery store (processed carbs)   I sent in the generic wegovy px

## 2022-07-25 NOTE — Progress Notes (Signed)
Subjective:    Patient ID: Jamie Shaffer, female    DOB: [birth year], 62 y.o.   MRN: [id number]  HPI Pt presents to discuss wt loss  Wt Readings from Last 3 Encounters:  07/25/22 181 lb 8 oz (82.3 kg)  07/25/22 176 lb (79.8 kg)  04/26/22 179 lb (81.2 kg)   32.15 kg/m   Vitals:   07/25/22 1452 07/25/22 1532  BP: (!) 140/70 135/77  Pulse: 75   Temp: 98 F (36.7 C)   SpO2: 98%     Is interested in GLP drug for weight loss   Exercise - she does walking and cardio video every am for exercise Also some weights   Did weight watchers  She cut out bread and sweet tea   That does not last   Her diet efforts do not last more than 7 days  Craves thinks Addicted to things that taste good  Does not always eat for hunger   She had a consultation to a weight loss clinic but could not afford to go   Drinks water    She really likes to eat   If her mammogram is good in the spring will be able to stop anastrozole    Patient Active Problem List   Diagnosis Date Noted   Elevated LDL cholesterol level 10/04/2020   Iron deficiency 09/20/2020   History of breast cancer 09/20/2020   Breast cancer of upper-outer quadrant of left female breast ([place]) 10/24/2015   Genetic testing 10/24/2015   Family history of breast cancer in first degree relative 10/04/2015   Family history of colon cancer 10/04/2015   Obesity 09/20/2014   Menopausal symptoms 01/16/2013   Routine general medical examination at a health care facility 02/04/2011   FIBROCYSTIC BREAST DISEASE 10/20/2007   PANIC DISORDER 10/17/2007   Past Medical History:  Diagnosis Date   Anemia    Arthritis    THUMB   Cancer ([place]) 10/2015   left breast/last radiation tx08/17/ no chemo   Fibrocystic breast    GERD (gastroesophageal reflux disease)    Hyperlipidemia    NO MEDS   Panic disorder    Personal history of radiation therapy    Post-menopausal    Past Surgical History:  Procedure Laterality Date    BREAST BIOPSY Left 09/28/2015   BREAST LUMPECTOMY Left 11/02/2015   BREAST LUMPECTOMY WITH RADIOACTIVE SEED AND SENTINEL LYMPH NODE BIOPSY Left 11/02/2015   Procedure: BREAST LUMPECTOMY WITH RADIOACTIVE SEED AND SENTINEL LYMPH NODE BIOPSY;  Surgeon: [name], MD;  Location: [facility];  Service: General;  Laterality: Left;   CESAREAN SECTION     COLONOSCOPY     fibroid tumor     PILONIDAL CYST EXCISION     VAGINAL HYSTERECTOMY     partial   Social History   Tobacco Use   Smoking status: Never   Smokeless tobacco: Never   Tobacco comments:    tried smoking as a teenager, but no continued use  Vaping Use   Vaping Use: Never used  Substance Use Topics   Alcohol use: No   Drug use: No   Family History  Problem Relation Age of Onset   Hypertension Mother    Diabetes Mother    Heart failure Mother 58   Breast cancer Mother 3       +lump   Heart Problems Father        valve replacement   Kidney failure Father 92   Prostate cancer Father  dx. later age; did not cause his death   Breast cancer Sister        dx. 22s; s/p mastectomy   Breast cancer Sister 39       s/p lump; negative genetic testing   Fibroids Sister    Breast cancer Sister 73       "cancerous cells"; s/p radiation   Fibroids Sister    Fibroids Sister    Bladder Cancer Brother 21       smoker; had issues for 7-8 yrs before going to the doctor   Lung cancer Brother        s/p surgery to remove nodule?; not a smoker; dx. 61-62   Colon cancer Maternal Aunt        dx. 6s   Alzheimer's disease Maternal Aunt        d. 17   Heart Problems Maternal Uncle    Colon cancer Maternal Uncle        dx. in 69s or younger   Bladder Cancer Paternal Uncle        dx. late 70s-80s   Aneurysm Maternal Grandmother        brain; d. 97s   Colon cancer Maternal Grandfather        d. 19s-50s   Heart disease Paternal Grandmother        d. 90s   Heart attack Paternal Grandmother    Heart disease  Paternal Grandfather        d. 50s   Heart attack Paternal Grandfather    Lung cancer Cousin        maternal 1st cousin dx. 60s; smoker   Prostate cancer Cousin        maternal 1st cousin dx. early 71s   Melanoma Cousin        maternal 1st cousin dx. 69s   Breast cancer Cousin 40       maternal 1st cousin    Kidney cancer Cousin        maternal 1st cousin dx. 54s   Breast cancer Cousin        paternal 1st cousin dx. 58s   Cancer Cousin        (x2) paternal 1st cousins d. unspecified cancers at unspecified ages   Colon polyps Neg Hx    Esophageal cancer Neg Hx    Rectal cancer Neg Hx    Stomach cancer Neg Hx    Ulcerative colitis Neg Hx    No Known Allergies Current Outpatient Medications on File Prior to Visit  Medication Sig Dispense Refill   ALPRAZolam (XANAX) 0.5 MG tablet Take 1 tablet (0.5 mg total) by mouth 2 (two) times daily as needed for anxiety or sleep. 10 tablet 0   anastrozole (ARIMIDEX) 1 MG tablet Take 1 tablet (1 mg total) by mouth daily. 90 tablet 3   Calcium Carbonate-Vitamin D (CALTRATE 600+D PO) Take by mouth daily.     Multiple Vitamin (MULTIVITAMIN) tablet Take 1 tablet by mouth daily.     Na Sulfate-K Sulfate-Mg Sulf 17.5-3.13-1.6 GM/177ML SOLN Take 1 kit by mouth once for 1 dose. May use generic- NO PA"s 354 mL 0   No current facility-administered medications on file prior to visit.    Review of Systems  Constitutional:  Positive for fatigue. Negative for activity change, appetite change, fever and unexpected weight change.  HENT:  Negative for congestion, ear pain, rhinorrhea, sinus pressure and sore throat.   Eyes:  Negative for pain, redness and visual disturbance.  Respiratory:  Negative for cough, shortness of breath and wheezing.   Cardiovascular:  Negative for chest pain and palpitations.  Gastrointestinal:  Negative for abdominal pain, blood in stool, constipation and diarrhea.  Endocrine: Negative for polydipsia and polyuria.  Genitourinary:   Negative for dysuria, frequency and urgency.  Musculoskeletal:  Negative for arthralgias, back pain and myalgias.  Skin:  Negative for pallor and rash.  Allergic/Immunologic: Negative for environmental allergies.  Neurological:  Negative for dizziness, syncope and headaches.  Hematological:  Negative for adenopathy. Does not bruise/bleed easily.  Psychiatric/Behavioral:  Negative for decreased concentration and dysphoric mood. The patient is not nervous/anxious.        Objective:   Physical Exam Constitutional:      General: She is not in acute distress.    Appearance: Normal appearance. She is well-developed. She is not ill-appearing or diaphoretic.  HENT:     Head: Normocephalic and atraumatic.  Eyes:     Conjunctiva/sclera: Conjunctivae normal.     Pupils: Pupils are equal, round, and reactive to light.  Neck:     Thyroid: No thyromegaly.     Vascular: No carotid bruit or JVD.  Cardiovascular:     Rate and Rhythm: Normal rate and regular rhythm.     Heart sounds: Normal heart sounds.     No gallop.  Pulmonary:     Effort: Pulmonary effort is normal. No respiratory distress.     Breath sounds: Normal breath sounds. No wheezing or rales.  Abdominal:     General: There is no distension or abdominal bruit.     Palpations: Abdomen is soft. There is no mass.     Tenderness: There is no abdominal tenderness. There is no guarding or rebound.  Musculoskeletal:     Cervical back: Normal range of motion and neck supple.     Right lower leg: No edema.     Left lower leg: No edema.  Lymphadenopathy:     Cervical: No cervical adenopathy.  Skin:    General: Skin is warm and dry.     Coloration: Skin is not pale.     Findings: No rash.  Neurological:     Mental Status: She is alert.     Coordination: Coordination normal.     Deep Tendon Reflexes: Reflexes are normal and symmetric. Reflexes normal.  Psychiatric:        Mood and Affect: Mood normal.           Assessment &  Plan:   Problem List Items Addressed This Visit       Other   Obesity - Primary    Discussed barriers to weight loss effort and reviewed her chart Arimidex does make it more difficult Also cravings and non hunger eating habits , having to cook for family who demands higher calorie food  Disc strategies and need for family meeting Recommend she consider NOOM program to investigate her mal adaptive eating habits and learn about healthier eating and exercise  (Enc her to keep up good exercise) Consider lower glycemic diet choices  Interested in semaglutide if affordable and available Disc option of GLP medication including possible side effects like GI intolerance and risk of thyroid and endocrine cancer, pancreatitis and gallstones, kidney problems and diabetic retinopathy Px for wegovy 0.25 mg sent to pharmacy to try  Will plan f/u once we know status  Inst to call if side eff and discussed expectations If helpful would inc dose montlhy   31 Minutes were spent today  both face to face and in the chart obtaining history, reviewing records and test results, performing exam , educating and discussing treatment options, and placing orders          Relevant Medications   Semaglutide-Weight Management (WEGOVY) 0.25 MG/0.5ML SOAJ

## 2022-07-25 NOTE — Progress Notes (Signed)

## 2022-08-01 ENCOUNTER — Encounter: Payer: Self-pay | Admitting: Gastroenterology

## 2022-08-06 ENCOUNTER — Other Ambulatory Visit (HOSPITAL_COMMUNITY): Payer: Self-pay

## 2022-08-06 NOTE — Telephone Encounter (Signed)
Patient Advocate Encounter   Received notification from [place] that prior authorization for [facility] is required.   PA submitted on 08/06/2022 Key [id number] Status is pending

## 2022-08-07 NOTE — Telephone Encounter (Signed)
Wegovy denial. See full report in media.

## 2022-08-08 NOTE — Telephone Encounter (Signed)
Pharmacy Patient Advocate Encounter  Received notification from [place] that the request for prior authorization for [facility] has been denied due to .    Please be advised we currently do not have a Pharmacist to review denials, therefore you will need to process appeals accordingly as needed. Thanks for your support at this time.   If PA is needed, please see below

## 2022-08-15 ENCOUNTER — Encounter: Payer: Self-pay | Admitting: Gastroenterology

## 2022-08-15 ENCOUNTER — Ambulatory Visit: Payer: No Typology Code available for payment source | Admitting: Gastroenterology

## 2022-08-15 VITALS — BP 118/79 | HR 67 | Temp 96.9°F | Resp 17 | Ht 63.0 in | Wt 176.0 lb

## 2022-08-15 DIAGNOSIS — Z800 Family history of malignant neoplasm of digestive organs: Secondary | ICD-10-CM | POA: Diagnosis not present

## 2022-08-15 DIAGNOSIS — Z1211 Encounter for screening for malignant neoplasm of colon: Secondary | ICD-10-CM | POA: Diagnosis present

## 2022-08-15 MED ORDER — SODIUM CHLORIDE 0.9 % IV SOLN: 500.0000 mL | INTRAVENOUS | Status: DC

## 2022-08-15 NOTE — Patient Instructions (Signed)
YOU HAD AN ENDOSCOPIC PROCEDURE TODAY AT THE [facility]:   Refer to the procedure report that was given to you for any specific questions about what was found during the examination.  If the procedure report does not answer your questions, please call your gastroenterologist to clarify.  If you requested that your care partner not be given the details of your procedure findings, then the procedure report has been included in a sealed envelope for you to review at your convenience later.  YOU SHOULD EXPECT: Some feelings of bloating in the abdomen. Passage of more gas than usual.  Walking can help get rid of the air that was put into your GI tract during the procedure and reduce the bloating. If you had a lower endoscopy (such as a colonoscopy or flexible sigmoidoscopy) you may notice spotting of blood in your stool or on the toilet paper. If you underwent a bowel prep for your procedure, you may not have a normal bowel movement for a few days.  Please Note:  You might notice some irritation and congestion in your nose or some drainage.  This is from the oxygen used during your procedure.  There is no need for concern and it should clear up in a day or so.  SYMPTOMS TO REPORT IMMEDIATELY:  Following lower endoscopy (colonoscopy or flexible sigmoidoscopy):  Excessive amounts of blood in the stool  Significant tenderness or worsening of abdominal pains  Swelling of the abdomen that is new, acute  Fever of 100F or higher  For urgent or emergent issues, a gastroenterologist can be reached at any hour by calling [phone]. Do not use MyChart messaging for urgent concerns.    DIET:  We do recommend a small meal at first, but then you may proceed to your regular diet.  Drink plenty of fluids but you should avoid alcoholic beverages for 24 hours.  ACTIVITY:  You should plan to take it easy for the rest of today and you should NOT DRIVE or use heavy machinery until tomorrow (because of  the sedation medicines used during the test).    FOLLOW UP: Our staff will call the number listed on your records the next business day following your procedure.  We will call around 7:15- 8:00 am to check on you and address any questions or concerns that you may have regarding the information given to you following your procedure. If we do not reach you, we will leave a message.     If any biopsies were taken you will be contacted by phone or by letter within the next 1-3 weeks.  Please call us at [phone] if you have not heard about the biopsies in 3 weeks.    SIGNATURES/CONFIDENTIALITY: You and/or your care partner have signed paperwork which will be entered into your electronic medical record.  These signatures attest to the fact that that the information above on your After Visit Summary has been reviewed and is understood.  Full responsibility of the confidentiality of this discharge information lies with you and/or your care-partner. 

## 2022-08-15 NOTE — Progress Notes (Signed)
History and Physical:  This patient presents for endoscopic testing for: Encounter Diagnosis  Name Primary?   Family history of malignant neoplasm of gastrointestinal tract Yes    No polyps last colonoscopy Ardis Hughs) Feb 2018 or on 2012 Family Hx CRC in multiple 2nd degree relatives Patient denies chronic abdominal pain, rectal bleeding, constipation or diarrhea.   Patient is otherwise without complaints or active issues today.   Past Medical History: Past Medical History:  Diagnosis Date   Anemia    Arthritis    THUMB   Cancer ([place]) 10/2015   left breast/last radiation tx08/17/ no chemo   Fibrocystic breast    GERD (gastroesophageal reflux disease)    Hyperlipidemia    NO MEDS   Panic disorder    Personal history of radiation therapy    Post-menopausal      Past Surgical History: Past Surgical History:  Procedure Laterality Date   BREAST BIOPSY Left 09/28/2015   BREAST LUMPECTOMY Left 11/02/2015   BREAST LUMPECTOMY WITH RADIOACTIVE SEED AND SENTINEL LYMPH NODE BIOPSY Left 11/02/2015   Procedure: BREAST LUMPECTOMY WITH RADIOACTIVE SEED AND SENTINEL LYMPH NODE BIOPSY;  Surgeon: [name], MD;  Location: [facility];  Service: General;  Laterality: Left;   CESAREAN SECTION     COLONOSCOPY     fibroid tumor     PILONIDAL CYST EXCISION     VAGINAL HYSTERECTOMY     partial    Allergies: No Known Allergies  Outpatient Meds: Current Outpatient Medications  Medication Sig Dispense Refill   Calcium Carbonate-Vitamin D (CALTRATE 600+D PO) Take by mouth daily.     Multiple Vitamin (MULTIVITAMIN) tablet Take 1 tablet by mouth daily.     ALPRAZolam (XANAX) 0.5 MG tablet Take 1 tablet (0.5 mg total) by mouth 2 (two) times daily as needed for anxiety or sleep. 10 tablet 0   anastrozole (ARIMIDEX) 1 MG tablet Take 1 tablet (1 mg total) by mouth daily. 90 tablet 3   Semaglutide-Weight Management (WEGOVY) 0.25 MG/0.5ML SOAJ Inject 0.25 mg into the skin once a  week. (Patient not taking: Reported on 08/15/2022) 2 mL 0   Current Facility-Administered Medications  Medication Dose Route Frequency Provider Last Rate Last Admin   0.9 %  sodium chloride infusion  500 mL Intravenous Continuous Danis, Estill Cotta III, MD          ___________________________________________________________________ Objective   Exam:  BP (!) 170/78   Pulse 67   Temp (!) 96.9 F (36.1 C) (Temporal)   Resp 13   Ht '5\' 3"'$  (1.6 m)   Wt 176 lb (79.8 kg)   SpO2 98%   BMI 31.18 kg/m   CV: regular , S1/S2 Resp: clear to auscultation bilaterally, normal RR and effort noted GI: soft, no tenderness, with active bowel sounds.   Assessment: Encounter Diagnosis  Name Primary?   Family history of malignant neoplasm of gastrointestinal tract Yes     Plan: Colonoscopy  The benefits and risks of the planned procedure were described in detail with the patient or (when appropriate) their health care proxy.  Risks were outlined as including, but not limited to, bleeding, infection, perforation, adverse medication reaction leading to cardiac or pulmonary decompensation, pancreatitis (if ERCP).  The limitation of incomplete mucosal visualization was also discussed.  No guarantees or warranties were given.    The patient is appropriate for an endoscopic procedure in the ambulatory setting.   - Wilfrid Lund, MD

## 2022-08-15 NOTE — Op Note (Signed)
Jamie Shaffer Patient Name: Jamie Shaffer Procedure Date: 08/15/2022 10:43 AM MRN: [id number] Endoscopist: Mallie Mussel L. Loletha Carrow , MD, [id number] Age: 62 Referring MD:  Date of Birth: 26-Mar-1961 Gender: Female Account #: [id number] Procedure:                Colonoscopy Indications:              Screening for colon cancer: Family history of                            colorectal cancer in multiple 2nd degree relatives                           No polyps February 2018 or January 2012 Ardis Hughs) Medicines:                Monitored Anesthesia Care Procedure:                Pre-Anesthesia Assessment:                           - Prior to the procedure, a History and Physical                            was performed, and patient medications and                            allergies were reviewed. The patient's tolerance of                            previous anesthesia was also reviewed. The risks                            and benefits of the procedure and the sedation                            options and risks were discussed with the patient.                            All questions were answered, and informed consent                            was obtained. Prior Anticoagulants: The patient has                            taken no anticoagulant or antiplatelet agents. ASA                            Grade Assessment: II - A patient with mild systemic                            disease. After reviewing the risks and benefits,                            the patient was deemed in satisfactory condition to  undergo the procedure.                           After obtaining informed consent, the colonoscope                            was passed under direct vision. Throughout the                            procedure, the patient's blood pressure, pulse, and                            oxygen saturations were monitored continuously. The                            CF  [id number] was introduced through the anus                            and advanced to the the cecum, identified by                            appendiceal orifice and ileocecal valve. The                            colonoscopy was performed without difficulty. The                            patient tolerated the procedure well. The quality                            of the bowel preparation was excellent. The                            ileocecal valve, appendiceal orifice, and rectum                            were photographed. Scope In: 11:01:04 AM Scope Out: 11:11:49 AM Scope Withdrawal Time: 0 hours 7 minutes 26 seconds  Total Procedure Duration: 0 hours 10 minutes 45 seconds  Findings:                 The perianal and digital rectal examinations were                            normal.                           Repeat examination of right colon under NBI                            performed.                           A few small-mouthed diverticula were found in the  left colon.                           The exam was otherwise without abnormality on                            direct and retroflexion views. Complications:            No immediate complications. Estimated Blood Loss:     Estimated blood loss: none. Impression:               - Diverticulosis in the left colon.                           - The examination was otherwise normal on direct                            and retroflexion views.                           - No specimens collected. Recommendation:           - Patient has a contact number available for                            emergencies. The signs and symptoms of potential                            delayed complications were discussed with the                            patient. Return to normal activities tomorrow.                            Written discharge instructions were provided to the                            patient.                            - Resume previous diet.                           - Continue present medications.                           - Repeat colonoscopy in 10 years for screening                            purposes. Tyara Dassow L. Loletha Carrow, MD 08/15/2022 11:17:20 AM This report has been signed electronically.

## 2022-08-15 NOTE — Progress Notes (Signed)
Uneventful anesthetic. Report to pacu rn. Vss. Care resumed by rn.

## 2022-08-15 NOTE — Progress Notes (Signed)
Pt's states no medical or surgical changes since previsit or office visit. 

## 2022-08-16 ENCOUNTER — Telehealth: Payer: Self-pay | Admitting: *Deleted

## 2022-08-16 NOTE — Telephone Encounter (Signed)
  Follow up Call-     08/15/2022    9:52 AM  Call back number  Post procedure Call Back phone  # [phone]  Permission to leave phone message Yes     Patient questions:  Do you have a fever, pain , or abdominal swelling? No. Pain Score  0 *  Have you tolerated food without any problems? Yes.    Have you been able to return to your normal activities? Yes.    Do you have any questions about your discharge instructions: Diet   No. Medications  No. Follow up visit  No.  Do you have questions or concerns about your Care? No.  Actions: * If pain score is 4 or above: No action needed, pain <4.

## 2022-09-25 ENCOUNTER — Telehealth (INDEPENDENT_AMBULATORY_CARE_PROVIDER_SITE_OTHER): Payer: No Typology Code available for payment source | Admitting: Family Medicine

## 2022-09-25 ENCOUNTER — Other Ambulatory Visit (INDEPENDENT_AMBULATORY_CARE_PROVIDER_SITE_OTHER): Payer: No Typology Code available for payment source

## 2022-09-25 DIAGNOSIS — E611 Iron deficiency: Secondary | ICD-10-CM

## 2022-09-25 DIAGNOSIS — E7800 Pure hypercholesterolemia, unspecified: Secondary | ICD-10-CM

## 2022-09-25 DIAGNOSIS — Z0000 Encounter for general adult medical examination without abnormal findings: Secondary | ICD-10-CM | POA: Diagnosis not present

## 2022-09-25 LAB — CBC WITH DIFFERENTIAL/PLATELET
Basophils Absolute: 0 10*3/uL (ref 0.0–0.1)
Basophils Relative: 0.9 % (ref 0.0–3.0)
Eosinophils Absolute: 0.1 10*3/uL (ref 0.0–0.7)
Eosinophils Relative: 2.1 % (ref 0.0–5.0)
HCT: 37 % (ref 36.0–46.0)
Hemoglobin: 12.1 g/dL (ref 12.0–15.0)
Lymphocytes Relative: 44.9 % (ref 12.0–46.0)
Lymphs Abs: 1.6 10*3/uL (ref 0.7–4.0)
MCHC: 32.6 g/dL (ref 30.0–36.0)
MCV: 84.4 fl (ref 78.0–100.0)
Monocytes Absolute: 0.4 10*3/uL (ref 0.1–1.0)
Monocytes Relative: 9.8 % (ref 3.0–12.0)
Neutro Abs: 1.5 10*3/uL (ref 1.4–7.7)
Neutrophils Relative %: 42.3 % — ABNORMAL LOW (ref 43.0–77.0)
Platelets: 281 10*3/uL (ref 150.0–400.0)
RBC: 4.39 Mil/uL (ref 3.87–5.11)
RDW: 14.6 % (ref 11.5–15.5)
WBC: 3.6 10*3/uL — ABNORMAL LOW (ref 4.0–10.5)

## 2022-09-25 LAB — COMPREHENSIVE METABOLIC PANEL
ALT: 38 U/L — ABNORMAL HIGH (ref 0–35)
AST: 31 U/L (ref 0–37)
Albumin: 4 g/dL (ref 3.5–5.2)
Alkaline Phosphatase: 81 U/L (ref 39–117)
BUN: 16 mg/dL (ref 6–23)
CO2: 27 mEq/L (ref 19–32)
Calcium: 9.6 mg/dL (ref 8.4–10.5)
Chloride: 103 mEq/L (ref 96–112)
Creatinine, Ser: 0.7 mg/dL (ref 0.40–1.20)
GFR: 92.01 mL/min (ref >60.00)
Glucose, Bld: 91 mg/dL (ref 70–99)
Potassium: 4.2 mEq/L (ref 3.5–5.1)
Sodium: 139 mEq/L (ref 135–145)
Total Bilirubin: 0.6 mg/dL (ref 0.2–1.2)
Total Protein: 6.9 g/dL (ref 6.0–8.3)

## 2022-09-25 LAB — LIPID PANEL
Cholesterol: 207 mg/dL — ABNORMAL HIGH (ref 0–200)
HDL: 78.2 mg/dL (ref >39.00)
LDL Cholesterol: 112 mg/dL — ABNORMAL HIGH (ref 0–99)
NonHDL: 129.12
Total CHOL/HDL Ratio: 3
Triglycerides: 84 mg/dL (ref 0.0–149.0)
VLDL: 16.8 mg/dL (ref 0.0–40.0)

## 2022-09-25 LAB — TSH: TSH: 2.01 u[IU]/mL (ref 0.35–5.50)

## 2022-09-25 LAB — IRON: Iron: 93 ug/dL (ref 42–145)

## 2022-09-25 LAB — FERRITIN: Ferritin: 89 ng/mL (ref 10.0–291.0)

## 2022-09-25 NOTE — Telephone Encounter (Signed)
Lab orders

## 2022-10-02 ENCOUNTER — Encounter: Payer: Self-pay | Admitting: Family Medicine

## 2022-10-02 ENCOUNTER — Ambulatory Visit (INDEPENDENT_AMBULATORY_CARE_PROVIDER_SITE_OTHER): Payer: No Typology Code available for payment source | Admitting: Family Medicine

## 2022-10-02 VITALS — BP 118/74 | HR 83 | Temp 98.0°F | Ht 62.25 in | Wt 180.5 lb

## 2022-10-02 DIAGNOSIS — E611 Iron deficiency: Secondary | ICD-10-CM | POA: Diagnosis not present

## 2022-10-02 DIAGNOSIS — Z23 Encounter for immunization: Secondary | ICD-10-CM | POA: Diagnosis not present

## 2022-10-02 DIAGNOSIS — Z853 Personal history of malignant neoplasm of breast: Secondary | ICD-10-CM

## 2022-10-02 DIAGNOSIS — Z0000 Encounter for general adult medical examination without abnormal findings: Secondary | ICD-10-CM

## 2022-10-02 DIAGNOSIS — E7800 Pure hypercholesterolemia, unspecified: Secondary | ICD-10-CM

## 2022-10-02 DIAGNOSIS — Z6832 Body mass index (BMI) 32.0-32.9, adult: Secondary | ICD-10-CM

## 2022-10-02 DIAGNOSIS — E6609 Other obesity due to excess calories: Secondary | ICD-10-CM | POA: Diagnosis not present

## 2022-10-02 NOTE — Assessment & Plan Note (Signed)
Reviewed health habits including diet and exercise and skin cancer prevention Reviewed appropriate screening tests for age  Also reviewed health mt list, fam hx and immunization status , as well as social and family history   See HPI Labs reviewed and ordered Plans to get 2nd shingrix vaccine Td updated today  Mammogram due in may along with breast cancer f/u and may stop arimidex Pap 2019-sent for report from her gyn  Dexa report sent for as well- ? Osteopenia/ on arimidex , no falls or fx Disc imp of strength training and options for it Disc ca and D supplementation

## 2022-10-02 NOTE — Assessment & Plan Note (Signed)
Disc goals for lipids and reasons to control them Rev last labs with pt Rev low sat fat diet in detail   LDL is down to 112 HDL in 24M

## 2022-10-02 NOTE — Patient Instructions (Addendum)
If you are interested in the new shingles vaccine (Shingrix) - call your local pharmacy to check on coverage and availability  If affordable, get on a wait list at your pharmacy to get the vaccine.    Tetanus shot today    Get your mammogram as planned   Keep walking  Add some strength training to your routine, this is important for bone and brain health and can reduce your risk of falls and help your body use insulin properly and regulate weight  Light weights, exercise bands , and internet videos are a good way to start  Yoga (chair or regular), machines , floor exercises or a gym with machines are also good options    Try to strength train 3 days per week    Try to get most of your carbohydrates from produce (with the exception of white potatoes)  Eat less bread/pasta/rice/snack foods/cereals/sweets and other items from the middle of the grocery store (processed carbs)

## 2022-10-02 NOTE — Progress Notes (Signed)
Subjective:    Patient ID: Jamie Shaffer, female    DOB: [birth year], 62 y.o.   MRN: [id number]  HPI Here for health maintenance exam and to review chronic medical problems    Wt Readings from Last 3 Encounters:  10/02/22 180 lb 8 oz (81.9 kg)  08/15/22 176 lb (79.8 kg)  07/25/22 181 lb 8 oz (82.3 kg)   32.75 kg/m  Vitals:   10/02/22 1529  BP: 118/74  Pulse: 83  Temp: 98 F (36.7 C)  SpO2: 98%   Taking care of herself      Immunization History  Administered Date(s) Administered   Influenza Split 03/04/2012, 04/12/2020   Influenza, Seasonal, Injecte, Preservative Fre 03/20/2016   Influenza,trivalent, recombinat, inj, PF 03/30/2015   Moderna Sars-Covid-2 Vaccination 08/06/2019, 09/03/2019, 05/10/2020   Td 06/18/1996, 10/20/2007   Tdap 02/13/2011   Zoster Recombinat (Shingrix) 02/11/2019   Health Maintenance Due  Topic Date Due   DTaP/Tdap/Td (4 - Td or Tdap) 02/12/2021   Needs 2nd shingrix -will get that   Tetanus shot - today    Mammogram 10/2021  Personal h/o breast cancer  On arimidex - should be done in the next few months - if mammogram is normal will stop it in June  Self breast exam  Pap 05/2018   nl / gyn- last year December   Colonoscopy 07/2022 with 5 y recall  Fam h/o colon cancer  Had dexa in gyn office several years ago  ? Osteopenia /on arimidex  Falls-none  Fractures-none  Supplements  ca and D  Exercise - walking in the mornings , then 30 minutes in evening      She has had genetic testing   Iron def Lab Results  Component Value Date   WBC 3.6 (L) 09/25/2022   HGB 12.1 09/25/2022   HCT 37.0 09/25/2022   MCV 84.4 09/25/2022   PLT 281.0 09/25/2022   Lab Results  Component Value Date   IRON 93 09/25/2022   FERRITIN 89.0 09/25/2022     Hyperlipidemia Lab Results  Component Value Date   CHOL 207 (H) 09/25/2022   CHOL 216 (H) 09/21/2021   CHOL 215 (H) 08/26/2020   Lab Results  Component Value Date   HDL  78.20 09/25/2022   HDL 78.30 09/21/2021   HDL 79.40 08/26/2020   Lab Results  Component Value Date   LDLCALC 112 (H) 09/25/2022   LDLCALC 126 (H) 09/21/2021   LDLCALC 122 (H) 08/26/2020   Lab Results  Component Value Date   TRIG 84.0 09/25/2022   TRIG 60.0 09/21/2021   TRIG 70.0 08/26/2020   Lab Results  Component Value Date   CHOLHDL 3 09/25/2022   CHOLHDL 3 09/21/2021   CHOLHDL 3 08/26/2020   Lab Results  Component Value Date   LDLDIRECT 93.0 02/26/2012   A little better LDL  Working on it  Eating some oatmeal   Last metabolic panel Lab Results  Component Value Date   GLUCOSE 91 09/25/2022   NA 139 09/25/2022   K 4.2 09/25/2022   CL 103 09/25/2022   CO2 27 09/25/2022   BUN 16 09/25/2022   CREATININE 0.70 09/25/2022   CALCIUM 9.6 09/25/2022   PROT 6.9 09/25/2022   ALBUMIN 4.0 09/25/2022   BILITOT 0.6 09/25/2022   ALKPHOS 81 09/25/2022   AST 31 09/25/2022   ALT 38 (H) 09/25/2022  No alcohol  No tylenol  Did gain weight   GFR 90s   Lab Results  Component Value  Date   TSH 2.01 09/25/2022   Patient Active Problem List   Diagnosis Date Noted   Hyperlipidemia 10/04/2020   Iron deficiency 09/20/2020   History of breast cancer 09/20/2020   Breast cancer of upper-outer quadrant of left female breast 10/24/2015   Genetic testing 10/24/2015   Family history of breast cancer in first degree relative 10/04/2015   Family history of colon cancer 10/04/2015   Obesity 09/20/2014   Menopausal symptoms 01/16/2013   Routine general medical examination at a health care facility 02/04/2011   FIBROCYSTIC BREAST DISEASE 10/20/2007   PANIC DISORDER 10/17/2007   Past Medical History:  Diagnosis Date   Anemia    Arthritis    THUMB   Cancer 10/2015   left breast/last radiation tx08/17/ no chemo   Fibrocystic breast    GERD (gastroesophageal reflux disease)    Hyperlipidemia    NO MEDS   Panic disorder    Personal history of radiation therapy    Post-menopausal     Past Surgical History:  Procedure Laterality Date   BREAST BIOPSY Left 09/28/2015   BREAST LUMPECTOMY Left 11/02/2015   BREAST LUMPECTOMY WITH RADIOACTIVE SEED AND SENTINEL LYMPH NODE BIOPSY Left 11/02/2015   Procedure: BREAST LUMPECTOMY WITH RADIOACTIVE SEED AND SENTINEL LYMPH NODE BIOPSY;  Surgeon: Almond Lint, MD;  Location: [facility];  Service: General;  Laterality: Left;   CESAREAN SECTION     COLONOSCOPY     fibroid tumor     PILONIDAL CYST EXCISION     VAGINAL HYSTERECTOMY     partial   Social History   Tobacco Use   Smoking status: Never   Smokeless tobacco: Never   Tobacco comments:    tried smoking as a teenager, but no continued use  Vaping Use   Vaping Use: Never used  Substance Use Topics   Alcohol use: No   Drug use: No   Family History  Problem Relation Age of Onset   Hypertension Mother    Diabetes Mother    Heart failure Mother 49   Breast cancer Mother 71       +lump   Heart Problems Father        valve replacement   Kidney failure Father 62   Prostate cancer Father        dx. later age; did not cause his death   Breast cancer Sister        dx. 23s; s/p mastectomy   Breast cancer Sister 53       s/p lump; negative genetic testing   Fibroids Sister    Breast cancer Sister 12       "cancerous cells"; s/p radiation   Fibroids Sister    Fibroids Sister    Bladder Cancer Brother 11       smoker; had issues for 7-8 yrs before going to the doctor   Lung cancer Brother        s/p surgery to remove nodule?; not a smoker; dx. 61-62   Colon cancer Maternal Aunt        dx. 54s   Alzheimer's disease Maternal Aunt        d. 83   Heart Problems Maternal Uncle    Colon cancer Maternal Uncle        dx. in 63s or younger   Bladder Cancer Paternal Uncle        dx. late 70s-80s   Aneurysm Maternal Grandmother        brain; d. 30s  Colon cancer Maternal Grandfather        d. 40s-50s   Heart disease Paternal Grandmother        d. 85s    Heart attack Paternal Grandmother    Heart disease Paternal Grandfather        d. 51s   Heart attack Paternal Grandfather    Lung cancer Cousin        maternal 1st cousin dx. 13s; smoker   Prostate cancer Cousin        maternal 1st cousin dx. early 44s   Melanoma Cousin        maternal 1st cousin dx. 30s   Breast cancer Cousin 23       maternal 1st cousin    Kidney cancer Cousin        maternal 1st cousin dx. 40s   Breast cancer Cousin        paternal 1st cousin dx. 60s   Cancer Cousin        (x2) paternal 1st cousins d. unspecified cancers at unspecified ages   Colon polyps Neg Hx    Esophageal cancer Neg Hx    Rectal cancer Neg Hx    Stomach cancer Neg Hx    Ulcerative colitis Neg Hx    No Known Allergies Current Outpatient Medications on File Prior to Visit  Medication Sig Dispense Refill   ALPRAZolam (XANAX) 0.5 MG tablet Take 1 tablet (0.5 mg total) by mouth 2 (two) times daily as needed for anxiety or sleep. 10 tablet 0   anastrozole (ARIMIDEX) 1 MG tablet Take 1 tablet (1 mg total) by mouth daily. 90 tablet 3   Calcium Carbonate-Vitamin D (CALTRATE 600+D PO) Take by mouth daily.     Multiple Vitamin (MULTIVITAMIN) tablet Take 1 tablet by mouth daily.     No current facility-administered medications on file prior to visit.    Review of Systems  Constitutional:  Positive for fatigue and unexpected weight change. Negative for activity change, appetite change and fever.  HENT:  Negative for congestion, ear pain, rhinorrhea, sinus pressure and sore throat.   Eyes:  Negative for pain, redness and visual disturbance.  Respiratory:  Negative for cough, shortness of breath and wheezing.   Cardiovascular:  Negative for chest pain and palpitations.  Gastrointestinal:  Negative for abdominal pain, blood in stool, constipation and diarrhea.  Endocrine: Negative for polydipsia and polyuria.  Genitourinary:  Negative for dysuria, frequency and urgency.  Musculoskeletal:   Negative for arthralgias, back pain and myalgias.  Skin:  Negative for pallor and rash.  Allergic/Immunologic: Negative for environmental allergies.  Neurological:  Negative for dizziness, syncope and headaches.  Hematological:  Negative for adenopathy. Does not bruise/bleed easily.  Psychiatric/Behavioral:  Negative for decreased concentration and dysphoric mood. The patient is not nervous/anxious.        Objective:   Physical Exam Constitutional:      General: She is not in acute distress.    Appearance: Normal appearance. She is well-developed. She is obese. She is not ill-appearing or diaphoretic.  HENT:     Head: Normocephalic and atraumatic.     Right Ear: Tympanic membrane, ear canal and external ear normal.     Left Ear: Tympanic membrane, ear canal and external ear normal.     Nose: Nose normal. No congestion.     Mouth/Throat:     Mouth: Mucous membranes are moist.     Pharynx: Oropharynx is clear. No posterior oropharyngeal erythema.  Eyes:  General: No scleral icterus.    Extraocular Movements: Extraocular movements intact.     Conjunctiva/sclera: Conjunctivae normal.     Pupils: Pupils are equal, round, and reactive to light.  Neck:     Thyroid: No thyromegaly.     Vascular: No carotid bruit or JVD.  Cardiovascular:     Rate and Rhythm: Normal rate and regular rhythm.     Pulses: Normal pulses.     Heart sounds: Normal heart sounds.     No gallop.  Pulmonary:     Effort: Pulmonary effort is normal. No respiratory distress.     Breath sounds: Normal breath sounds. No wheezing.     Comments: Good air exch Chest:     Chest wall: No tenderness.  Abdominal:     General: Bowel sounds are normal. There is no distension or abdominal bruit.     Palpations: Abdomen is soft. There is no mass.     Tenderness: There is no abdominal tenderness.     Hernia: No hernia is present.  Genitourinary:    Comments: Breast and pelvic exam done by gyn provider  Also oncologist   Musculoskeletal:        General: No tenderness. Normal range of motion.     Cervical back: Normal range of motion and neck supple. No rigidity. No muscular tenderness.     Right lower leg: No edema.     Left lower leg: No edema.     Comments: No kyphosis   Lymphadenopathy:     Cervical: No cervical adenopathy.  Skin:    General: Skin is warm and dry.     Coloration: Skin is not pale.     Findings: No erythema or rash.  Neurological:     Mental Status: She is alert. Mental status is at baseline.     Cranial Nerves: No cranial nerve deficit.     Motor: No abnormal muscle tone.     Coordination: Coordination normal.     Gait: Gait normal.     Deep Tendon Reflexes: Reflexes are normal and symmetric. Reflexes normal.  Psychiatric:        Mood and Affect: Mood normal.        Cognition and Memory: Cognition and memory normal.           Assessment & Plan:   Problem List Items Addressed This Visit       Other   History of breast cancer    Mammogram due in may  Hopes to be done with arimidex at that time       Hyperlipidemia    Disc goals for lipids and reasons to control them Rev last labs with pt Rev low sat fat diet in detail   LDL is down to 112 HDL in 40J       Iron deficiency    Cbc and iron levels are nl today      Obesity    Discussed how this problem influences overall health and the risks it imposes  Reviewed plan for weight loss with lower calorie diet (via better food choices and also portion control or program like weight watchers) and exercise building up to or more than 30 minutes 5 days per week including some aerobic activity   May be easier to loose wt when done with arimidex Disc low glycemic diet Disc strength training         Routine general medical examination at a health care facility - Primary    Reviewed  health habits including diet and exercise and skin cancer prevention Reviewed appropriate screening tests for age  Also reviewed  health mt list, fam hx and immunization status , as well as social and family history   See HPI Labs reviewed and ordered Plans to get 2nd shingrix vaccine Td updated today  Mammogram due in may along with breast cancer f/u and may stop arimidex Pap 2019-sent for report from her gyn  Dexa report sent for as well- ? Osteopenia/ on arimidex , no falls or fx Disc imp of strength training and options for it Disc ca and D supplementation        Other Visit Diagnoses     Need for Td vaccine       Relevant Orders   Td : Tetanus/diphtheria >7yo Preservative  free (Completed)

## 2022-10-02 NOTE — Assessment & Plan Note (Signed)
Cbc and iron levels are nl today

## 2022-10-02 NOTE — Assessment & Plan Note (Signed)
Discussed how this problem influences overall health and the risks it imposes  Reviewed plan for weight loss with lower calorie diet (via better food choices and also portion control or program like weight watchers) and exercise building up to or more than 30 minutes 5 days per week including some aerobic activity   May be easier to loose wt when done with arimidex Disc low glycemic diet Disc strength training

## 2022-10-02 NOTE — Assessment & Plan Note (Signed)
Mammogram due in may  Hopes to be done with arimidex at that time

## 2022-10-03 ENCOUNTER — Other Ambulatory Visit: Payer: Self-pay | Admitting: Hematology and Oncology

## 2022-10-03 DIAGNOSIS — Z0000 Encounter for general adult medical examination without abnormal findings: Secondary | ICD-10-CM

## 2022-10-17 NOTE — Congregational Nurse Program (Signed)
  Dept: [phone]   Congregational Nurse Program Note  Date of Encounter: 10/17/2022 1215 pm Client into nurse only clinic requesting guidance on weight loss. Declines all screenings. Reviewed basic nutrition with client (eg  "My Plate" ) and apps to access (eg my plate and My FitnessPal). Additional info to be gathered and sent via email to client. Co to increase water intake to a minimum of 60 cc/day, read and follow serving sizes on packaging, and build exercise (at least walking) regularly. To follow up in this clinic for monitoring between PMD visits. Rjhermann, RN  Past Medical History: Past Medical History:  Diagnosis Date   Anemia    Arthritis    THUMB   Cancer (HCC) 10/2015   left breast/last radiation tx08/17/ no chemo   Fibrocystic breast    GERD (gastroesophageal reflux disease)    Hyperlipidemia    NO MEDS   Panic disorder    Personal history of radiation therapy    Post-menopausal     Encounter Details:  CNP Questionnaire - 10/17/22 1215       Questionnaire   Ask client: Do you give verbal consent for me to treat you today? Yes    Student Assistance N/A    Location Patient Served  [facility]    Visit Setting with Client Organization    Patient Status Unknown    Engineer, building services or [place] Insurance    Insurance/Financial Assistance Referral N/A    Medication N/A    Medical Provider Yes    Screening Referrals Made N/A    Medical Referrals Made N/A    Medical Appointment Made N/A    Recently w/o PCP, now 1st time PCP visit completed due to CNs referral or appointment made N/A    Food N/A    Transportation N/A    Housing/Utilities N/A    Interpersonal Safety N/A    Interventions Educate;Advocate/Support    Abnormal to Normal Screening Since Last CN Visit N/A    Screenings CN Performed N/A    Sent Client to Lab for: N/A    Did client attend any of the following based off CNs referral or appointments made? N/A    ED Visit Averted N/A    Life-Saving  Intervention Made N/A

## 2022-10-18 ENCOUNTER — Other Ambulatory Visit: Payer: Self-pay | Admitting: Family Medicine

## 2022-10-18 MED ORDER — ALPRAZOLAM 0.5 MG PO TABS: 0.5000 mg | ORAL_TABLET | Freq: Two times a day (BID) | ORAL | 0 refills | Status: DC | PRN

## 2022-10-18 NOTE — Telephone Encounter (Signed)
Pt notified Rx sent to pharmacy and advised of Dr. Tower's comments  

## 2022-10-18 NOTE — Telephone Encounter (Signed)
So sorry to hear that! I sent it  Caution of sedation and habit   Follow up if more help is needed

## 2022-10-18 NOTE — Telephone Encounter (Signed)
See last comment on prev message saying:  Patient states she has had a death in the family and is needing something to help during this time. Wants to know if Dr. Milinda Antis can order this for her again, please advise thank you.   Name of Medication: Xanax Name of Pharmacy: [facility] Lavonia Dana or Written Date and Quantity: 01/24/21 #10 tabs/ 0 refills  Last Office Visit and Type: CPE on 10/02/22 Next Office Visit and Type: none scheduled

## 2022-10-18 NOTE — Telephone Encounter (Signed)
Prescription Request  10/18/2022  LOV: 10/02/2022  What is the name of the medication or equipment? ALPRAZolam (XANAX) 0.5 MG tablet   Have you contacted your pharmacy to request a refill? No   Which pharmacy would you like this sent to?  [id number] [address] [place] [address] Phone: [phone] Fax: [phone]    Patient notified that their request is being sent to the clinical staff for review and that they should receive a response within 2 business days.   Please advise at [facility] [phone]  Patient states she has had a death in the family and is needing something to help during this time. Wants to know if Dr. Milinda Antis can order this for her again, please advise thank you.

## 2022-11-16 ENCOUNTER — Ambulatory Visit
Admission: RE | Admit: 2022-11-16 | Discharge: 2022-11-16 | Disposition: A | Discharge status: Home / Self Care | Payer: No Typology Code available for payment source | Source: Ambulatory Visit | Attending: Hematology and Oncology

## 2022-11-16 DIAGNOSIS — Z0000 Encounter for general adult medical examination without abnormal findings: Secondary | ICD-10-CM

## 2022-11-16 HISTORY — DX: Malignant neoplasm of unspecified site of unspecified female breast: C50.919

## 2022-11-19 ENCOUNTER — Inpatient Hospital Stay
Payer: No Typology Code available for payment source | Attending: Hematology and Oncology | Admitting: Hematology and Oncology

## 2022-11-19 DIAGNOSIS — Z923 Personal history of irradiation: Secondary | ICD-10-CM | POA: Insufficient documentation

## 2022-11-19 DIAGNOSIS — C50412 Malignant neoplasm of upper-outer quadrant of left female breast: Secondary | ICD-10-CM | POA: Insufficient documentation

## 2022-11-19 DIAGNOSIS — Z170 Estrogen receptor positive status [ER+]: Secondary | ICD-10-CM | POA: Insufficient documentation

## 2022-11-19 DIAGNOSIS — Z79811 Long term (current) use of aromatase inhibitors: Secondary | ICD-10-CM | POA: Insufficient documentation

## 2022-11-19 NOTE — Assessment & Plan Note (Deleted)
Left lumpectomy: IDC with DCIS, 1 cm, margins negative, 1/3 lymph nodes positive, ER 100%, PR 20%, Ki-67 10%, HER-2 negative ratio 1.32, T1bN1 stage II A Mammaprint: Luminal type A, low risk, did not need chemotherapy. Adjuvant radiation therapy from 11/28/2015 to 02/03/2016   CurrentTreatment : Adjuvant antiestrogen therapy With anastrozole 1 mg daily started 02/07/16 (plan for 7 years)   Anastrozole Toxicities: 1.  Hot flashes: Mild to moderate 2. hair thinning  Denies any joint pains or arthritis   Surveillance: 1. Breast exam 11/19/2022:Benign  2. Mammogram 11/16/2022:Benign,  breast density category C   Return to clinic in 1 year for follow-up

## 2022-11-20 ENCOUNTER — Telehealth: Payer: Self-pay | Admitting: *Deleted

## 2022-11-20 NOTE — Telephone Encounter (Signed)
Received VM from pt stating she has called scheduling several times to set up yearly f/u.  RN sent high priority message to scheduling team to arrange appt.

## 2022-11-21 ENCOUNTER — Telehealth: Payer: Self-pay | Admitting: Hematology and Oncology

## 2022-11-21 NOTE — Telephone Encounter (Signed)
Scheduled appointment per scheduling message. Patient is aware of the made appointment. 

## 2022-12-09 NOTE — Progress Notes (Signed)
Patient Care Team: Tower, Audrie Gallus, MD as PCP - General  DIAGNOSIS:  Encounter Diagnosis  Name Primary?   Malignant neoplasm of upper-outer quadrant of left breast in female, estrogen receptor positive (HCC) Yes    SUMMARY OF ONCOLOGIC HISTORY: Oncology History  Breast cancer of upper-outer quadrant of left female breast (HCC)  09/28/2015 Initial Diagnosis   Left breast biopsy 2:00 position: IDC with DCIS, grade 1, ER 100%, PR 20%, Ki-67 10%, HER-2 negative ratio 1.32, MRI revealed 7 x 5 x 10 mm in the posterior third left breast, T1 cN0 stage IA clinical stage   10/04/2015 Genetic Testing   There were no deleterious mutations or a variants of uncertain significance (VUSes) identified.  Genes tested: APC, ATM, AXIN2, BARD1, BMPR1A, BRCA1, BRCA2, BRIP1, CDH1, CDK4, CDKN2A, CHEK2, EPCAM, FANCC, MLH1, MSH2 (with MSH2 Exons 1-7 Inversion Analysis), MSH6, MUTYH, NBN, PALB2, PMS2, POLD1, POLE, PTEN, RAD51C, RAD51D, SCG5/GREM1, SMAD4, STK11, TP53, VHL, and XRCC2.   11/02/2015 Surgery   Left lumpectomy ([place]): IDC with DCIS, 1 cm, margins negative, 1/3 lymph nodes positive, ER 100%, PR 20%, Ki-67 10%, HER-2 negative ratio 1.32, T1bN1 stage IIA, Mammaprint luminal-type A, low risk   12/07/2015 - 02/03/2016 Radiation Therapy   Radiation ([facility]): Left breast/left supraclav: 50.4Gy, 28 fractions, left axilla: 12.32 Gy, 7 fractions, left breast boost: 14.4 Gy in 8 fractions.     02/07/2016 -  Anti-estrogen oral therapy   Anastrozole 1mg  daily     CHIEF COMPLIANT: Follow-up of left breast cancer/ anastrozole  INTERVAL HISTORY: Jamie Shaffer is a 62 y.o. with above-mentioned history of left breast cancer treated with lumpectomy, radiation, and who is currently on anastrozole.  She presents to the clinic for a follow-up. She tolerated the letrozole extremely well.    ALLERGIES:  has No Known Allergies.  MEDICATIONS:  Current Outpatient Medications  Medication Sig Dispense Refill    ALPRAZolam (XANAX) 0.5 MG tablet Take 1 tablet (0.5 mg total) by mouth 2 (two) times daily as needed for anxiety (severe anxiety). Cautoin of sedation , do not drive with this 10 tablet 0   anastrozole (ARIMIDEX) 1 MG tablet Take 1 tablet (1 mg total) by mouth daily. 90 tablet 3   Calcium Carbonate-Vitamin D (CALTRATE 600+D PO) Take by mouth daily.     Multiple Vitamin (MULTIVITAMIN) tablet Take 1 tablet by mouth daily.     No current facility-administered medications for this visit.    PHYSICAL EXAMINATION: ECOG PERFORMANCE STATUS: 1 - Symptomatic but completely ambulatory  Vitals:   12/13/22 0829  BP: 133/78  Pulse: 85  Resp: 18  Temp: (!) 97.3 F (36.3 C)  SpO2: 99%   Filed Weights   12/13/22 0829  Weight: 179 lb (81.2 kg)    BREAST: No palpable masses or nodules in either right or left breasts. No palpable axillary supraclavicular or infraclavicular adenopathy no breast tenderness or nipple discharge. (exam performed in the presence of a chaperone)  LABORATORY DATA:  I have reviewed the data as listed    Latest Ref Rng & Units 09/25/2022    8:19 AM 09/21/2021    7:42 AM 08/26/2020    7:42 AM  CMP  Glucose 70 - 99 mg/dL 91  95  94   BUN 6 - 23 mg/dL 16  18  18    Creatinine 0.40 - 1.20 mg/dL 2.95  2.84  1.32   Sodium 135 - 145 mEq/L 139  140  140   Potassium 3.5 - 5.1 mEq/L 4.2  4.4  4.3   Chloride 96 - 112 mEq/L 103  104  105   CO2 19 - 32 mEq/L 27  29  28    Calcium 8.4 - 10.5 mg/dL 9.6  9.6  9.7   Total Protein 6.0 - 8.3 g/dL 6.9  6.7  7.0   Total Bilirubin 0.2 - 1.2 mg/dL 0.6  0.5  0.5   Alkaline Phos 39 - 117 U/L 81  94  100   AST 0 - 37 U/L 31  24  21    ALT 0 - 35 U/L 38  32  31     Lab Results  Component Value Date   WBC 3.6 (L) 09/25/2022   HGB 12.1 09/25/2022   HCT 37.0 09/25/2022   MCV 84.4 09/25/2022   PLT 281.0 09/25/2022   NEUTROABS 1.5 09/25/2022    ASSESSMENT & PLAN:  Breast cancer of upper-outer quadrant of left female breast (HCC) Left  lumpectomy: IDC with DCIS, 1 cm, margins negative, 1/3 lymph nodes positive, ER 100%, PR 20%, Ki-67 10%, HER-2 negative ratio 1.32, T1bN1 stage II A Mammaprint: Luminal type A, low risk, did not need chemotherapy. Adjuvant radiation therapy from 11/28/2015 to 02/03/2016   CurrentTreatment : Adjuvant antiestrogen therapy With anastrozole 1 mg daily started 02/07/16 (plan for 7 years) completed June 2024   Anastrozole Toxicities: 1.  Hot flashes: Mild to moderate 2. hair thinning  Denies any joint pains or arthritis   Surveillance: 1. Breast exam 12/13/2022:Benign  2. Mammogram 11/14/2021:Benign,  breast density category C   Return to clinic on an as-needed basis.    No orders of the defined types were placed in this encounter.  The patient has a good understanding of the overall plan. she agrees with it. she will call with any problems that may develop before the next visit here. Total time spent: 30 mins including face to face time and time spent for planning, charting and co-ordination of care   Tamsen Meek, MD 12/13/22    I Janan Ridge am acting as a Neurosurgeon for [employer]  I have reviewed the above documentation for accuracy and completeness, and I agree with the above.

## 2022-12-13 ENCOUNTER — Other Ambulatory Visit: Payer: Self-pay

## 2022-12-13 ENCOUNTER — Inpatient Hospital Stay: Payer: No Typology Code available for payment source | Admitting: Hematology and Oncology

## 2022-12-13 VITALS — BP 133/78 | HR 85 | Temp 97.3°F | Resp 18 | Ht 62.25 in | Wt 179.0 lb

## 2022-12-13 DIAGNOSIS — Z923 Personal history of irradiation: Secondary | ICD-10-CM | POA: Diagnosis not present

## 2022-12-13 DIAGNOSIS — Z79811 Long term (current) use of aromatase inhibitors: Secondary | ICD-10-CM | POA: Diagnosis not present

## 2022-12-13 DIAGNOSIS — Z170 Estrogen receptor positive status [ER+]: Secondary | ICD-10-CM | POA: Diagnosis present

## 2022-12-13 DIAGNOSIS — C50412 Malignant neoplasm of upper-outer quadrant of left female breast: Secondary | ICD-10-CM | POA: Diagnosis present

## 2022-12-13 NOTE — Assessment & Plan Note (Signed)
Left lumpectomy: IDC with DCIS, 1 cm, margins negative, 1/3 lymph nodes positive, ER 100%, PR 20%, Ki-67 10%, HER-2 negative ratio 1.32, T1bN1 stage II A Mammaprint: Luminal type A, low risk, did not need chemotherapy. Adjuvant radiation therapy from 11/28/2015 to 02/03/2016   CurrentTreatment : Adjuvant antiestrogen therapy With anastrozole 1 mg daily started 02/07/16 (plan for 7 years)   Anastrozole Toxicities: 1.  Hot flashes: Mild to moderate 2. hair thinning  Denies any joint pains or arthritis   Surveillance: 1. Breast exam 12/13/2022:Benign  2. Mammogram 11/14/2021:Benign,  breast density category C   Return to clinic in 1 year for follow-up

## 2023-07-10 ENCOUNTER — Telehealth: Payer: Self-pay | Admitting: Family Medicine

## 2023-07-10 ENCOUNTER — Encounter: Payer: Self-pay | Admitting: Family Medicine

## 2023-07-10 ENCOUNTER — Ambulatory Visit: Payer: BC Managed Care – PPO | Admitting: Family Medicine

## 2023-07-10 VITALS — BP 114/70 | HR 71 | Temp 97.8°F | Ht 62.25 in | Wt 171.4 lb

## 2023-07-10 DIAGNOSIS — H8110 Benign paroxysmal vertigo, unspecified ear: Secondary | ICD-10-CM | POA: Diagnosis not present

## 2023-07-10 DIAGNOSIS — R42 Dizziness and giddiness: Secondary | ICD-10-CM | POA: Diagnosis not present

## 2023-07-10 DIAGNOSIS — R110 Nausea: Secondary | ICD-10-CM

## 2023-07-10 MED ORDER — MECLIZINE HCL 25 MG PO TABS: 25.0000 mg | ORAL_TABLET | Freq: Three times a day (TID) | ORAL | 0 refills | Status: DC | PRN

## 2023-07-10 NOTE — Telephone Encounter (Signed)
Reached out to patient. States this morning she felt a little woozy in her head. She sates that she is not having any other symptoms just feels a little off and nausea no vomiting. I have set up for evaluation today with Dr. Patsy Lager. If any other symptoms or changes she will reach out.

## 2023-07-10 NOTE — Progress Notes (Signed)
Jamie Shaffer T. Chaz Ronning, MD, CAQ Sports Medicine [facility] [address] [place], [address]  Phone: [phone]  FAX: [phone]  Jamie Shaffer - 63 y.o. female  MRN [id number]  Date of Birth: Apr 04, 1961  Date: 07/10/2023  PCP: Judy Pimple, MD  Referral: Judy Pimple, MD  Chief Complaint  Patient presents with   Dizziness   Nausea   Subjective:   Jamie Shaffer is a 63 y.o. very pleasant female patient with Body mass index is 31.09 kg/m. who presents with the following:  The patient presents for an acute work in, and she feels somewhat lightheaded and woozy.  Head feeling uneasy Feels like need to vomit No diarrhea No cough Occ headache  Feels like balance off and spinning Thinks vision may be cloudy  Eating a lot Made some sausages    Review of Systems is noted in the HPI, as appropriate  Objective:   BP 114/70 (BP Location: Right Arm, Patient Position: Sitting, Cuff Size: Normal)   Pulse 71   Temp 97.8 F (36.6 C) (Temporal)   Ht 5' 2.25" (1.581 m)   Wt 171 lb 6 oz (77.7 kg)   SpO2 96%   BMI 31.09 kg/m   Orthostatic VS for the past 24 hrs:  BP- Lying Pulse- Lying BP- Sitting Pulse- Sitting BP- Standing at 0 minutes Pulse- Standing at 0 minutes  07/10/23 1416 132/78 61 129/79 65 129/79 76   GEN: No acute distress; alert,appropriate. PULM: Breathing comfortably in no respiratory distress CV: RRR, no m/g/r   Neuro: CN 2-12 grossly intact. PERRLA. EOMI. Sensation intact throughout. Str 5/5 all extremities.  A and o x 4. Romberg neg. Finger nose neg.   -She does have inducible vertigo when she raises her head from the region of her lap upwards.  She does not have inducible vertigo with rotation of the head.  PSYCH: Normally interactive. Conversant. Not depressed or anxious appearing.  Calm demeanor.    Laboratory and Imaging Data:  Assessment and Plan:     ICD-10-CM   1. BPPV (benign  paroxysmal positional vertigo), unspecified laterality  H81.10     2. Dizziness  R42 CBC with Differential/Platelet    Basic metabolic panel    Hepatic function panel    TSH    3. Nausea  R11.0 CBC with Differential/Platelet    Basic metabolic panel    Hepatic function panel    TSH     I think this is all most likely benign positional vertigo.  I am going to have the patient take some meclizine as needed, and would anticipate that this will improve slowly.  I cautioned her and recommended that she not drive right now.  I will check some additional baseline labs to ensure that they are normal.  Medication Management during today's office visit: Meds ordered this encounter  Medications   meclizine (ANTIVERT) 25 MG tablet    Sig: Take 1 tablet (25 mg total) by mouth 3 (three) times daily as needed for dizziness.    Dispense:  30 tablet    Refill:  0   Medications Discontinued During This Encounter  Medication Reason   anastrozole (ARIMIDEX) 1 MG tablet Discontinued by provider    Orders placed today for conditions managed today: Orders Placed This Encounter  Procedures   CBC with Differential/Platelet   Basic metabolic panel   Hepatic function panel   TSH    Disposition: No follow-ups on file.  [employer] speech-to-text software was used for transcription in this dictation.  Possible transcriptional errors can occur using Animal nutritionist.   Signed,  Elpidio Galea. Samanthan Dugo, MD   Outpatient Encounter Medications as of 07/10/2023  Medication Sig   ALPRAZolam (XANAX) 0.5 MG tablet Take 1 tablet (0.5 mg total) by mouth 2 (two) times daily as needed for anxiety (severe anxiety). Cautoin of sedation , do not drive with this   Calcium Carbonate-Vitamin D (CALTRATE 600+D PO) Take by mouth daily.   meclizine (ANTIVERT) 25 MG tablet Take 1 tablet (25 mg total) by mouth 3 (three) times daily as needed for dizziness.   Multiple Vitamin (MULTIVITAMIN) tablet Take 1 tablet by  mouth daily.   [DISCONTINUED] anastrozole (ARIMIDEX) 1 MG tablet Take 1 tablet (1 mg total) by mouth daily.   No facility-administered encounter medications on file as of 07/10/2023.

## 2023-07-10 NOTE — Telephone Encounter (Signed)
Thank you :)

## 2023-07-10 NOTE — Telephone Encounter (Signed)
Copied from CRM [id number]. Topic: Clinical - Medication Question >> Jul 10, 2023 11:09 AM Donita Brooks wrote: Reason for CRM: pt would like to speak with Dr. Milinda Antis nurse. Pt had a few questions about how she feeling today, before she makes an appointment

## 2023-07-11 LAB — BASIC METABOLIC PANEL
BUN: 16 mg/dL (ref 6–23)
CO2: 30 meq/L (ref 19–32)
Calcium: 9.7 mg/dL (ref 8.4–10.5)
Chloride: 103 meq/L (ref 96–112)
Creatinine, Ser: 0.64 mg/dL (ref 0.40–1.20)
GFR: 93.5 mL/min (ref >60.00)
Glucose, Bld: 103 mg/dL — ABNORMAL HIGH (ref 70–99)
Potassium: 4.5 meq/L (ref 3.5–5.1)
Sodium: 140 meq/L (ref 135–145)

## 2023-07-11 LAB — HEPATIC FUNCTION PANEL
ALT: 49 U/L — ABNORMAL HIGH (ref 0–35)
AST: 37 U/L (ref 0–37)
Albumin: 4.2 g/dL (ref 3.5–5.2)
Alkaline Phosphatase: 94 U/L (ref 39–117)
Bilirubin, Direct: 0.1 mg/dL (ref 0.0–0.3)
Total Bilirubin: 0.5 mg/dL (ref 0.2–1.2)
Total Protein: 7.2 g/dL (ref 6.0–8.3)

## 2023-07-11 LAB — CBC WITH DIFFERENTIAL/PLATELET
Basophils Absolute: 0 10*3/uL (ref 0.0–0.1)
Basophils Relative: 0.7 % (ref 0.0–3.0)
Eosinophils Absolute: 0 10*3/uL (ref 0.0–0.7)
Eosinophils Relative: 0.6 % (ref 0.0–5.0)
HCT: 37.6 % (ref 36.0–46.0)
Hemoglobin: 11.9 g/dL — ABNORMAL LOW (ref 12.0–15.0)
Lymphocytes Relative: 34.2 % (ref 12.0–46.0)
Lymphs Abs: 1.6 10*3/uL (ref 0.7–4.0)
MCHC: 31.7 g/dL (ref 30.0–36.0)
MCV: 86.4 fL (ref 78.0–100.0)
Monocytes Absolute: 0.4 10*3/uL (ref 0.1–1.0)
Monocytes Relative: 8.2 % (ref 3.0–12.0)
Neutro Abs: 2.7 10*3/uL (ref 1.4–7.7)
Neutrophils Relative %: 56.3 % (ref 43.0–77.0)
Platelets: 299 10*3/uL (ref 150.0–400.0)
RBC: 4.35 Mil/uL (ref 3.87–5.11)
RDW: 14.7 % (ref 11.5–15.5)
WBC: 4.8 10*3/uL (ref 4.0–10.5)

## 2023-07-11 LAB — TSH: TSH: 0.97 u[IU]/mL (ref 0.35–5.50)

## 2023-07-15 ENCOUNTER — Telehealth: Payer: Self-pay

## 2023-07-15 ENCOUNTER — Encounter: Payer: Self-pay | Admitting: Family Medicine

## 2023-07-15 DIAGNOSIS — R7989 Other specified abnormal findings of blood chemistry: Secondary | ICD-10-CM

## 2023-07-15 DIAGNOSIS — R7401 Elevation of levels of liver transaminase levels: Secondary | ICD-10-CM

## 2023-07-15 NOTE — Telephone Encounter (Signed)
Eating would not have affected the liver labs / but made the glucose a few points over reference range (that is ok) I would like to put In the order for liver ultrasound (do you prefer [facility] or [place] )?   -I suspect this will be fine / we are just being cautious  Then follow up with me in about a month for a visit and we will do labs that day as well  Follow up earlier if symptoms/dizziness continues

## 2023-07-15 NOTE — Telephone Encounter (Signed)
Copied from CRM [id number]. Topic: Clinical - Medication Question >> Jul 15, 2023 10:59 AM Corin V wrote: Reason for CRM: Patient was reviewing lab results and stated she had been eating before the lab draw and didn't know if that may have caused values to be high. She wants to know if she needs to repeat labs and when she should do her follow up with Dr. Milinda Antis.

## 2023-07-16 DIAGNOSIS — R7401 Elevation of levels of liver transaminase levels: Secondary | ICD-10-CM | POA: Insufficient documentation

## 2023-07-16 NOTE — Telephone Encounter (Signed)
I put the referral in  Please let us know if you don't hear in 1-2 weeks

## 2023-07-16 NOTE — Telephone Encounter (Signed)
U/S order placed

## 2023-07-16 NOTE — Telephone Encounter (Signed)
Called patient would like u/s sent to [place]. I have set up one month follow up. She will call to be seen sooner if any new symptoms or increased symptoms.

## 2023-07-16 NOTE — Addendum Note (Signed)
Addended by: Roxy Manns A on: 07/16/2023 08:29 PM   Modules accepted: Orders

## 2023-07-17 ENCOUNTER — Telehealth: Payer: Self-pay

## 2023-07-17 NOTE — Addendum Note (Signed)
Addended by: Shon Millet on: 07/17/2023 04:21 PM   Modules accepted: Orders

## 2023-07-17 NOTE — Telephone Encounter (Signed)
So sorry- I did not realize he already did it! Please cancel mine  Thanks !!

## 2023-07-17 NOTE — Telephone Encounter (Signed)
This is a duplicate message. See phone note for documentation

## 2023-07-17 NOTE — Telephone Encounter (Signed)
Called dri let know they will remove one orders

## 2023-07-17 NOTE — Telephone Encounter (Signed)
Copied from CRM [id number]. Topic: Clinical - Request for Lab/Test Order >> Jul 17, 2023  8:48 AM Shelbie Proctor wrote: Reason for CRM: Joyce Gross from [facility] imaging [phone] option 1 then 5. Patient is already scheduled from Dr. Patsy Lager 07/22/23 ultrasound of right upper quadrant abdomen. Needs clarification if ultrasound is a duplicate or if it's needed for next year. Please advise.

## 2023-07-22 ENCOUNTER — Ambulatory Visit
Admission: RE | Admit: 2023-07-22 | Discharge: 2023-07-22 | Disposition: A | Discharge status: Home / Self Care | Payer: BC Managed Care – PPO | Source: Ambulatory Visit | Attending: Family Medicine | Admitting: Family Medicine

## 2023-07-22 DIAGNOSIS — R945 Abnormal results of liver function studies: Secondary | ICD-10-CM | POA: Diagnosis not present

## 2023-07-22 DIAGNOSIS — R7989 Other specified abnormal findings of blood chemistry: Secondary | ICD-10-CM

## 2023-07-24 ENCOUNTER — Encounter: Payer: Self-pay | Admitting: Family Medicine

## 2023-08-15 ENCOUNTER — Ambulatory Visit: Payer: BC Managed Care – PPO | Admitting: Family Medicine

## 2023-08-16 ENCOUNTER — Encounter: Payer: Self-pay | Admitting: Family Medicine

## 2023-08-16 ENCOUNTER — Ambulatory Visit: Payer: BC Managed Care – PPO | Admitting: Family Medicine

## 2023-08-16 VITALS — BP 116/72 | HR 70 | Temp 98.0°F | Ht 62.25 in | Wt 174.2 lb

## 2023-08-16 DIAGNOSIS — R42 Dizziness and giddiness: Secondary | ICD-10-CM | POA: Diagnosis not present

## 2023-08-16 DIAGNOSIS — C50412 Malignant neoplasm of upper-outer quadrant of left female breast: Secondary | ICD-10-CM

## 2023-08-16 DIAGNOSIS — R7401 Elevation of levels of liver transaminase levels: Secondary | ICD-10-CM | POA: Diagnosis not present

## 2023-08-16 DIAGNOSIS — E611 Iron deficiency: Secondary | ICD-10-CM

## 2023-08-16 DIAGNOSIS — Z170 Estrogen receptor positive status [ER+]: Secondary | ICD-10-CM

## 2023-08-16 NOTE — Assessment & Plan Note (Signed)
 Was taking anastrazole  Follow up with onc due in June

## 2023-08-16 NOTE — Assessment & Plan Note (Signed)
 Reviewed notes and labs from Dr Patsy Lager in late jan  Was having vertigo symptoms  Overall better Last episode was brief 2 wk ago (used lavender topical product behind ears)  Normal exam  Instructed to update [place] if this re occurs  ? If viral

## 2023-08-16 NOTE — Assessment & Plan Note (Addendum)
 Lab Results  Component Value Date   ALT 49 (H) 07/10/2023   AST 37 07/10/2023   ALKPHOS 94 07/10/2023   BILITOT 0.5 07/10/2023   Normal [place] No symptoms Overall reassuring work up  Reviewed notes /labs from Dr Patsy Lager Normal exam Past history of breast cancer   No etoh No history of viral hepatitis  Minimal acetaminophen Will continue to watch  Annual exam is due in April  Encouraged healthy diet/exercise for weight loss

## 2023-08-16 NOTE — Progress Notes (Signed)
 Subjective:    Patient ID: Jamie Shaffer, female    DOB: 15-Jun-1961, 63 y.o.   MRN: [id number]  HPI  Wt Readings from Last 3 Encounters:  08/16/23 174 lb 4 oz (79 kg)  07/10/23 171 lb 6 oz (77.7 kg)  12/13/22 179 lb (81.2 kg)   31.62 kg/m  Vitals:   08/16/23 0925  BP: 116/72  Pulse: 70  Temp: 98 F (36.7 C)  SpO2: 97%    Pt presents for follow up of liver tests , dizziness and anemia  Had seen Dr Patsy Lager in late jan for nausea and dizziness (vertigo)  Labs were ordered   Feeling better now  Used essential oil/lavender - behind ears and it worked  Last episode was 2 weeks ago   Less fatigued Wonders if she did have a virus    Lab Results  Component Value Date   ALT 49 (H) 07/10/2023   AST 37 07/10/2023   ALKPHOS 94 07/10/2023   BILITOT 0.5 07/10/2023   ALT is mildly elevated   Had [place] of abd US ABDOMEN LIMITED RUQ (LIVER/GB) Result Date: 07/22/2023 : PROCEDURE: ULTRASOUND ABDOMEN LIMITED HISTORY: Patient is a 63 y/o  F with persistently elevated LFT's. COMPARISON: None available. TECHNIQUE: Two-dimensional grayscale and color Doppler ultrasound of the limited abdomen was performed. FINDINGS: The liver demonstrates a normal echotexture without intrahepatic biliary dilatation. No masses are visualized. There is normal hepatopetal flow visualized within the main portal vein. The gallbladder demonstrates normal anechoic echotexture without pericholecystic fluid or wall thickening. There are no gallstones. The common bile duct measures 0.3 cm. Negative sonographic Murphy's sign. IMPRESSION: 1. Unremarkable ultrasound of the liver and gallbladder. Thank you for allowing [place] to assist in the care of this patient. Electronically Signed   By: Lestine Box M.D.   On: 07/22/2023 19:53   Lab Results  Component Value Date   NA 140 07/10/2023   K 4.5 07/10/2023   CO2 30 07/10/2023   GLUCOSE 103 (H) 07/10/2023   BUN 16 07/10/2023   CREATININE 0.64 07/10/2023   CALCIUM  9.7 07/10/2023   GFR 93.50 07/10/2023   GFRNONAA 117.42 01/17/2010   Lab Results  Component Value Date   TSH 0.97 07/10/2023   Lab Results  Component Value Date   WBC 4.8 07/10/2023   HGB 11.9 (L) 07/10/2023   HCT 37.6 07/10/2023   MCV 86.4 07/10/2023   PLT 299.0 07/10/2023   Lab Results  Component Value Date   IRON 93 09/25/2022   FERRITIN 89.0 09/25/2022    History of chronic mild anemia   Not as active in the winter Likes to walk when weather is better   Has some weights at home from her husband     Lab Results  Component Value Date   CHOL 207 (H) 09/25/2022   HDL 78.20 09/25/2022   LDLCALC 112 (H) 09/25/2022   LDLDIRECT 93.0 02/26/2012   TRIG 84.0 09/25/2022   CHOLHDL 3 09/25/2022       Patient Active Problem List   Diagnosis Date Noted   Dizziness 08/16/2023   Elevated ALT measurement 07/16/2023   Hyperlipidemia 10/04/2020   Iron deficiency 09/20/2020   History of breast cancer 09/20/2020   Breast cancer of upper-outer quadrant of left female breast (HCC) 10/24/2015   Genetic testing 10/24/2015   Family history of breast cancer in first degree relative 10/04/2015   Family history of colon cancer 10/04/2015   Obesity 09/20/2014   Menopausal symptoms 01/16/2013  Routine general medical examination at a health care facility 02/04/2011   FIBROCYSTIC BREAST DISEASE 10/20/2007   PANIC DISORDER 10/17/2007   Past Medical History:  Diagnosis Date   Anemia    Arthritis    THUMB   Breast cancer (HCC)    Cancer (HCC) 10/2015   left breast/last radiation tx08/17/ no chemo   Fibrocystic breast    GERD (gastroesophageal reflux disease)    Hyperlipidemia    NO MEDS   Panic disorder    Personal history of radiation therapy    Post-menopausal    Past Surgical History:  Procedure Laterality Date   BREAST BIOPSY Left 09/28/2015   BREAST LUMPECTOMY Left 11/02/2015   BREAST LUMPECTOMY WITH RADIOACTIVE SEED AND SENTINEL LYMPH NODE BIOPSY Left 11/02/2015    Procedure: BREAST LUMPECTOMY WITH RADIOACTIVE SEED AND SENTINEL LYMPH NODE BIOPSY;  Surgeon: Almond Lint, MD;  Location: [facility];  Service: General;  Laterality: Left;   CESAREAN SECTION     COLONOSCOPY     fibroid tumor     PILONIDAL CYST EXCISION     VAGINAL HYSTERECTOMY     partial   Social History   Tobacco Use   Smoking status: Never   Smokeless tobacco: Never   Tobacco comments:    tried smoking as a teenager, but no continued use  Vaping Use   Vaping status: Never Used  Substance Use Topics   Alcohol use: No   Drug use: No   Family History  Problem Relation Age of Onset   Hypertension Mother    Diabetes Mother    Heart failure Mother 33   Breast cancer Mother 79       +lump   Heart Problems Father        valve replacement   Kidney failure Father 11   Prostate cancer Father        dx. later age; did not cause his death   Breast cancer Sister        dx. 31s; s/p mastectomy   Breast cancer Sister 45       s/p lump; negative genetic testing   Fibroids Sister    Breast cancer Sister 34       "cancerous cells"; s/p radiation   Fibroids Sister    Fibroids Sister    Bladder Cancer Brother 14       smoker; had issues for 7-8 yrs before going to the doctor   Lung cancer Brother        s/p surgery to remove nodule?; not a smoker; dx. 61-62   Colon cancer Maternal Aunt        dx. 94s   Alzheimer's disease Maternal Aunt        d. 67   Heart Problems Maternal Uncle    Colon cancer Maternal Uncle        dx. in 34s or younger   Bladder Cancer Paternal Uncle        dx. late 70s-80s   Aneurysm Maternal Grandmother        brain; d. 30s   Colon cancer Maternal Grandfather        d. 41s-50s   Heart disease Paternal Grandmother        d. 85s   Heart attack Paternal Grandmother    Heart disease Paternal Grandfather        d. 25s   Heart attack Paternal Grandfather    Lung cancer Cousin        maternal 1st cousin  dx. 32s; smoker   Prostate  cancer Cousin        maternal 1st cousin dx. early 87s   Melanoma Cousin        maternal 1st cousin dx. 30s   Breast cancer Cousin 65       maternal 1st cousin    Kidney cancer Cousin        maternal 1st cousin dx. 40s   Breast cancer Cousin        paternal 1st cousin dx. 60s   Cancer Cousin        (x2) paternal 1st cousins d. unspecified cancers at unspecified ages   Colon polyps Neg Hx    Esophageal cancer Neg Hx    Rectal cancer Neg Hx    Stomach cancer Neg Hx    Ulcerative colitis Neg Hx    No Known Allergies Current Outpatient Medications on File Prior to Visit  Medication Sig Dispense Refill   ALPRAZolam (XANAX) 0.5 MG tablet Take 1 tablet (0.5 mg total) by mouth 2 (two) times daily as needed for anxiety (severe anxiety). Cautoin of sedation , do not drive with this 10 tablet 0   Calcium Carbonate-Vitamin D (CALTRATE 600+D PO) Take by mouth daily.     meclizine (ANTIVERT) 25 MG tablet Take 1 tablet (25 mg total) by mouth 3 (three) times daily as needed for dizziness. 30 tablet 0   Multiple Vitamin (MULTIVITAMIN) tablet Take 1 tablet by mouth daily.     No current facility-administered medications on file prior to visit.    Review of Systems  Constitutional:  Negative for activity change, appetite change, fatigue, fever and unexpected weight change.       Gets tired from schedule/ work  HENT:  Negative for congestion, ear pain, rhinorrhea, sinus pressure and sore throat.   Eyes:  Negative for pain, redness and visual disturbance.  Respiratory:  Negative for cough, shortness of breath and wheezing.   Cardiovascular:  Negative for chest pain and palpitations.  Gastrointestinal:  Negative for abdominal pain, blood in stool, constipation and diarrhea.  Endocrine: Negative for polydipsia and polyuria.  Genitourinary:  Negative for dysuria, frequency and urgency.  Musculoskeletal:  Negative for arthralgias, back pain and myalgias.  Skin:  Negative for pallor and rash.   Allergic/Immunologic: Negative for environmental allergies.  Neurological:  Negative for dizziness, syncope and headaches.       Not dizzy today  Overall better   Hematological:  Negative for adenopathy. Does not bruise/bleed easily.  Psychiatric/Behavioral:  Negative for decreased concentration and dysphoric mood. The patient is not nervous/anxious.        Objective:   Physical Exam Constitutional:      General: She is not in acute distress.    Appearance: Normal appearance. She is well-developed. She is obese. She is not ill-appearing or diaphoretic.  HENT:     Head: Normocephalic and atraumatic.  Eyes:     Conjunctiva/sclera: Conjunctivae normal.     Pupils: Pupils are equal, round, and reactive to light.  Neck:     Thyroid: No thyromegaly.     Vascular: No carotid bruit or JVD.  Cardiovascular:     Rate and Rhythm: Normal rate and regular rhythm.     Heart sounds: Normal heart sounds.     No gallop.  Pulmonary:     Effort: Pulmonary effort is normal. No respiratory distress.     Breath sounds: Normal breath sounds. No wheezing or rales.  Abdominal:     General:  Abdomen is protuberant. Bowel sounds are normal. There is no distension or abdominal bruit.     Palpations: Abdomen is soft. There is no shifting dullness, hepatomegaly, splenomegaly, mass or pulsatile mass.     Tenderness: There is no abdominal tenderness.  Musculoskeletal:     Cervical back: Normal range of motion and neck supple.     Right lower leg: No edema.     Left lower leg: No edema.  Lymphadenopathy:     Cervical: No cervical adenopathy.  Skin:    General: Skin is warm and dry.     Coloration: Skin is not jaundiced or pale.     Findings: No bruising or rash.  Neurological:     Mental Status: She is alert.     Coordination: Coordination normal.     Deep Tendon Reflexes: Reflexes are normal and symmetric. Reflexes normal.  Psychiatric:        Mood and Affect: Mood normal.            Assessment & Plan:   Problem List Items Addressed This Visit       Other   Iron deficiency   Last hb 11.9  Feeling ok  Chronic  Used to take iron  Mcv is normal now  Last iron levels normal in April 2024  GFR nl      Elevated ALT measurement - Primary   Lab Results  Component Value Date   ALT 49 (H) 07/10/2023   AST 37 07/10/2023   ALKPHOS 94 07/10/2023   BILITOT 0.5 07/10/2023   Normal [place] No symptoms Overall reassuring work up  Reviewed notes /labs from Dr Patsy Lager Normal exam Past history of breast cancer   No etoh No history of viral hepatitis  Minimal acetaminophen Will continue to watch  Annual exam is due in April  Encouraged healthy diet/exercise for weight loss       Dizziness   Reviewed notes and labs from Dr Patsy Lager in late jan  Was having vertigo symptoms  Overall better Last episode was brief 2 wk ago (used lavender topical product behind ears)  Normal exam  Instructed to update [place] if this re occurs  ? If viral      Breast cancer of upper-outer quadrant of left female breast [facility])   Was taking anastrazole  Follow up with onc due in June

## 2023-08-16 NOTE — Patient Instructions (Addendum)
 Stay active Add some strength training to your routine, this is important for bone and brain health and can reduce your risk of falls and help your body use insulin properly and regulate weight  Light weights, exercise bands , and internet videos are a good way to start  Yoga (chair or regular), machines , floor exercises or a gym with machines are also good options    Try to get most of your carbohydrates from produce (with the exception of white potatoes) and whole grains Eat less bread/pasta/rice/snack foods/cereals/sweets and other items from the middle of the grocery store (processed carbs)  If vertigo keeps coming back let me know  Stay hydrated with water   Take care of yourself

## 2023-08-16 NOTE — Assessment & Plan Note (Addendum)
 Last hb 11.9  Feeling ok  Chronic  Used to take iron  Mcv is normal now  Last iron levels normal in April 2024  GFR nl

## 2023-09-29 ENCOUNTER — Telehealth: Payer: Self-pay | Admitting: Family Medicine

## 2023-09-29 DIAGNOSIS — E66811 Obesity, class 1: Secondary | ICD-10-CM

## 2023-09-29 DIAGNOSIS — Z0000 Encounter for general adult medical examination without abnormal findings: Secondary | ICD-10-CM

## 2023-09-29 DIAGNOSIS — E611 Iron deficiency: Secondary | ICD-10-CM

## 2023-09-29 DIAGNOSIS — Z131 Encounter for screening for diabetes mellitus: Secondary | ICD-10-CM | POA: Insufficient documentation

## 2023-09-29 DIAGNOSIS — R7401 Elevation of levels of liver transaminase levels: Secondary | ICD-10-CM

## 2023-09-29 DIAGNOSIS — E7800 Pure hypercholesterolemia, unspecified: Secondary | ICD-10-CM

## 2023-09-29 NOTE — Telephone Encounter (Signed)
-----   Message from Gerry Krone sent at 09/23/2023 11:12 AM EDT ----- Regarding: Lab orders for Tue, 4.15.25 Patient is scheduled for CPX labs, please order future labs, Thanks , Anselmo Kings

## 2023-09-30 ENCOUNTER — Telehealth: Payer: Self-pay | Admitting: Family Medicine

## 2023-09-30 NOTE — Telephone Encounter (Signed)
-----   Message from Gerry Krone sent at 09/16/2023  3:14 PM EDT ----- Regarding: Lab orders for Wed, 4.16.25 Patient is scheduled for CPX labs, please order future labs, Thanks , Anselmo Kings

## 2023-10-01 ENCOUNTER — Other Ambulatory Visit (INDEPENDENT_AMBULATORY_CARE_PROVIDER_SITE_OTHER)

## 2023-10-01 DIAGNOSIS — Z131 Encounter for screening for diabetes mellitus: Secondary | ICD-10-CM

## 2023-10-01 DIAGNOSIS — E6609 Other obesity due to excess calories: Secondary | ICD-10-CM | POA: Diagnosis not present

## 2023-10-01 DIAGNOSIS — E611 Iron deficiency: Secondary | ICD-10-CM | POA: Diagnosis not present

## 2023-10-01 DIAGNOSIS — Z6832 Body mass index (BMI) 32.0-32.9, adult: Secondary | ICD-10-CM | POA: Diagnosis not present

## 2023-10-01 DIAGNOSIS — R7401 Elevation of levels of liver transaminase levels: Secondary | ICD-10-CM

## 2023-10-01 DIAGNOSIS — E66811 Obesity, class 1: Secondary | ICD-10-CM | POA: Diagnosis not present

## 2023-10-01 DIAGNOSIS — E7800 Pure hypercholesterolemia, unspecified: Secondary | ICD-10-CM

## 2023-10-01 DIAGNOSIS — Z0000 Encounter for general adult medical examination without abnormal findings: Secondary | ICD-10-CM

## 2023-10-01 LAB — LIPID PANEL
Cholesterol: 220 mg/dL — ABNORMAL HIGH (ref 0–200)
HDL: 87.1 mg/dL (ref >39.00)
LDL Cholesterol: 116 mg/dL — ABNORMAL HIGH (ref 0–99)
NonHDL: 132.4
Total CHOL/HDL Ratio: 3
Triglycerides: 84 mg/dL (ref 0.0–149.0)
VLDL: 16.8 mg/dL (ref 0.0–40.0)

## 2023-10-01 LAB — COMPREHENSIVE METABOLIC PANEL WITH GFR
ALT: 35 U/L (ref 0–35)
AST: 25 U/L (ref 0–37)
Albumin: 4.1 g/dL (ref 3.5–5.2)
Alkaline Phosphatase: 123 U/L — ABNORMAL HIGH (ref 39–117)
BUN: 16 mg/dL (ref 6–23)
CO2: 29 meq/L (ref 19–32)
Calcium: 9.5 mg/dL (ref 8.4–10.5)
Chloride: 105 meq/L (ref 96–112)
Creatinine, Ser: 0.67 mg/dL (ref 0.40–1.20)
GFR: 92.33 mL/min (ref >60.00)
Glucose, Bld: 96 mg/dL (ref 70–99)
Potassium: 4.2 meq/L (ref 3.5–5.1)
Sodium: 139 meq/L (ref 135–145)
Total Bilirubin: 0.6 mg/dL (ref 0.2–1.2)
Total Protein: 6.9 g/dL (ref 6.0–8.3)

## 2023-10-01 LAB — CBC WITH DIFFERENTIAL/PLATELET
Basophils Absolute: 0 10*3/uL (ref 0.0–0.1)
Basophils Relative: 0.9 % (ref 0.0–3.0)
Eosinophils Absolute: 0.1 10*3/uL (ref 0.0–0.7)
Eosinophils Relative: 2.6 % (ref 0.0–5.0)
HCT: 38.1 % (ref 36.0–46.0)
Hemoglobin: 12 g/dL (ref 12.0–15.0)
Lymphocytes Relative: 47.9 % — ABNORMAL HIGH (ref 12.0–46.0)
Lymphs Abs: 1.7 10*3/uL (ref 0.7–4.0)
MCHC: 31.6 g/dL (ref 30.0–36.0)
MCV: 85.4 fl (ref 78.0–100.0)
Monocytes Absolute: 0.4 10*3/uL (ref 0.1–1.0)
Monocytes Relative: 10.1 % (ref 3.0–12.0)
Neutro Abs: 1.4 10*3/uL (ref 1.4–7.7)
Neutrophils Relative %: 38.5 % — ABNORMAL LOW (ref 43.0–77.0)
Platelets: 293 10*3/uL (ref 150.0–400.0)
RBC: 4.47 Mil/uL (ref 3.87–5.11)
RDW: 15 % (ref 11.5–15.5)
WBC: 3.6 10*3/uL — ABNORMAL LOW (ref 4.0–10.5)

## 2023-10-01 LAB — HEMOGLOBIN A1C: Hgb A1c MFr Bld: 5.6 % (ref 4.6–6.5)

## 2023-10-01 LAB — TSH: TSH: 1.49 u[IU]/mL (ref 0.35–5.50)

## 2023-10-01 LAB — IRON: Iron: 105 ug/dL (ref 42–145)

## 2023-10-01 LAB — FERRITIN: Ferritin: 85.6 ng/mL (ref 10.0–291.0)

## 2023-10-02 ENCOUNTER — Other Ambulatory Visit: Payer: BC Managed Care – PPO

## 2023-10-09 ENCOUNTER — Ambulatory Visit (INDEPENDENT_AMBULATORY_CARE_PROVIDER_SITE_OTHER): Payer: BC Managed Care – PPO | Admitting: Family Medicine

## 2023-10-09 ENCOUNTER — Encounter: Payer: Self-pay | Admitting: Family Medicine

## 2023-10-09 VITALS — BP 126/64 | HR 65 | Temp 98.5°F | Ht 62.5 in | Wt 171.4 lb

## 2023-10-09 DIAGNOSIS — Z0000 Encounter for general adult medical examination without abnormal findings: Secondary | ICD-10-CM | POA: Diagnosis not present

## 2023-10-09 DIAGNOSIS — Z131 Encounter for screening for diabetes mellitus: Secondary | ICD-10-CM

## 2023-10-09 DIAGNOSIS — E7800 Pure hypercholesterolemia, unspecified: Secondary | ICD-10-CM

## 2023-10-09 DIAGNOSIS — E611 Iron deficiency: Secondary | ICD-10-CM | POA: Diagnosis not present

## 2023-10-09 DIAGNOSIS — E66811 Obesity, class 1: Secondary | ICD-10-CM

## 2023-10-09 DIAGNOSIS — Z6830 Body mass index (BMI) 30.0-30.9, adult: Secondary | ICD-10-CM

## 2023-10-09 DIAGNOSIS — Z800 Family history of malignant neoplasm of digestive organs: Secondary | ICD-10-CM

## 2023-10-09 DIAGNOSIS — E6609 Other obesity due to excess calories: Secondary | ICD-10-CM

## 2023-10-09 DIAGNOSIS — Z853 Personal history of malignant neoplasm of breast: Secondary | ICD-10-CM

## 2023-10-09 NOTE — Assessment & Plan Note (Signed)
 Taking ferrous gluconate 240 mg daily and labs are in normal range/stable

## 2023-10-09 NOTE — Assessment & Plan Note (Signed)
 Colonoscopy 07/2022 with 5 y recall

## 2023-10-09 NOTE — Assessment & Plan Note (Signed)
 Doing well  Now off anastrozole  Oncology signed off  Sees gyn for exams  Mammogram is due next month-pt aware

## 2023-10-09 NOTE — Assessment & Plan Note (Signed)
 Disc goals for lipids and reasons to control them Rev last labs with pt Rev low sat fat diet in detail  HDL remains high  LDL on one teens

## 2023-10-09 NOTE — Assessment & Plan Note (Signed)
 Reviewed health habits including diet and exercise and skin cancer prevention Reviewed appropriate screening tests for age  Also reviewed health mt list, fam hx and immunization status , as well as social and family history   See HPI Labs reviewed and ordered Health Maintenance  Topic Date Due   Pap with HPV screening  08/06/2023   COVID-19 Vaccine (4 - 2024-25 season) 08/31/2024*   Hepatitis C Screening  10/08/2024*   HIV Screening  10/08/2024*   Zoster (Shingles) Vaccine (2 of 2) 01/07/2025*   Mammogram  11/16/2023   Flu Shot  01/17/2024   Colon Cancer Screening  08/16/2027   DTaP/Tdap/Td vaccine (5 - Td or Tdap) 10/01/2032   HPV Vaccine  Aged Out   Meningitis B Vaccine  Aged Out  *Topic was postponed. The date shown is not the original due date.   Due for 2nd shingles vaccine but ins changed and wants to make sure it is covered here before she gets it   Utd gyn care/ sent for pap report Dexa 2023 normal  Discussed fall prevention, supplements and exercise for bone density  PHQ 2 from some fatigue / menopause wise

## 2023-10-09 NOTE — Assessment & Plan Note (Signed)
 Lab Results  Component Value Date   HGBA1C 5.6 10/01/2023   disc imp of low glycemic diet and wt loss to prevent DM2

## 2023-10-09 NOTE — Assessment & Plan Note (Signed)
 Discussed how this problem influences overall health and the risks it imposes  Reviewed plan for weight loss with lower calorie diet (via better food choices (lower glycemic and portion control) along with exercise building up to or more than 30 minutes 5 days per week including some aerobic activity and strength training

## 2023-10-09 NOTE — Progress Notes (Signed)
 Subjective:    Patient ID: Jamie Shaffer, female    DOB: [birth year], 63 y.o.   MRN: [id number]  HPI  Here for health maintenance exam and to review chronic medical problems   Wt Readings from Last 3 Encounters:  10/09/23 171 lb 6 oz (77.7 kg)  08/16/23 174 lb 4 oz (79 kg)  07/10/23 171 lb 6 oz (77.7 kg)   30.85 kg/m  Vitals:   10/09/23 0823  BP: 126/64  Pulse: 65  Temp: 98.5 F (36.9 C)  SpO2: 98%    Immunization History  Administered Date(s) Administered   Influenza Split 03/04/2012, 04/12/2020   Influenza, Seasonal, Injecte, Preservative Fre 03/20/2016   Influenza,trivalent, recombinat, inj, PF 03/30/2015   Influenza-Unspecified 04/03/2023   Moderna Sars-Covid-2 Vaccination 08/06/2019, 09/03/2019, 05/10/2020   Td 06/18/1996, 10/20/2007, 10/02/2022   Tdap 02/13/2011   Zoster Recombinant(Shingrix ) 02/11/2019    Health Maintenance Due  Topic Date Due   Cervical Cancer Screening (HPV/Pap Cotest)  08/06/2023   Needs 2nd shingrix  , first was 2020  Mammogram 10/2022  Personal history of breast cancer and also fam history  Oncology turned her loose  Anastrazole - off of after 7 years  Self breast exam: no lumps or changes   Gyn health Has gyn/ sent for pap      Colon cancer screening  colonoscopy 07/2022 with 5 y recall   Bone health  Dexa 2023 at gyn /normal  Falls- tripped over her purse , and another time tripped down a curb  Fractures-none  Supplements ca and D and mvi    Exercise : walking regularly  Videos - some stretching  Using 3 lb weights     Mood    10/09/2023    8:30 AM 08/16/2023    9:29 AM 10/02/2022    3:35 PM 07/25/2022    3:04 PM 09/27/2021    3:51 PM  Depression screen PHQ 2/9  Decreased Interest 0 0 0 0 1  Down, Depressed, Hopeless 0 0 0 0 1  PHQ - 2 Score 0 0 0 0 2  Altered sleeping 0 0 0 0 0  Tired, decreased energy 1 0 0 0 1  Change in appetite 1 1 1 1 1   Feeling bad or failure about yourself  0 0 0 0 0   Trouble concentrating 0 0 0 0 0  Moving slowly or fidgety/restless 0 0 0 0 0  Suicidal thoughts 0 0 0 0 0  PHQ-9 Score 2 1 1 1 4   Difficult doing work/chores Not difficult at all Not difficult at all Not difficult at all Not difficult at all    Hyperlipidemia Lab Results  Component Value Date   CHOL 220 (H) 10/01/2023   CHOL 207 (H) 09/25/2022   CHOL 216 (H) 09/21/2021   Lab Results  Component Value Date   HDL 87.10 10/01/2023   HDL 78.20 09/25/2022   HDL 78.30 09/21/2021   Lab Results  Component Value Date   LDLCALC 116 (H) 10/01/2023   LDLCALC 112 (H) 09/25/2022   LDLCALC 126 (H) 09/21/2021   Lab Results  Component Value Date   TRIG 84.0 10/01/2023   TRIG 84.0 09/25/2022   TRIG 60.0 09/21/2021   Lab Results  Component Value Date   CHOLHDL 3 10/01/2023   CHOLHDL 3 09/25/2022   CHOLHDL 3 09/21/2021   Lab Results  Component Value Date   LDLDIRECT 93.0 02/26/2012   She did eat fried shrimp before her lab  Some sausage and  bacon  Doing better now     History of iron  def Continues to take iron   Lab Results  Component Value Date   WBC 3.6 (L) 10/01/2023   HGB 12.0 10/01/2023   HCT 38.1 10/01/2023   MCV 85.4 10/01/2023   PLT 293.0 10/01/2023   Lab Results  Component Value Date   IRON  105 10/01/2023   FERRITIN 85.6 10/01/2023   DM screen Lab Results  Component Value Date   HGBA1C 5.6 10/01/2023  Trying to eat better 0-not overeating  Exercising     Lab Results  Component Value Date   NA 139 10/01/2023   K 4.2 10/01/2023   CO2 29 10/01/2023   GLUCOSE 96 10/01/2023   BUN 16 10/01/2023   CREATININE 0.67 10/01/2023   CALCIUM 9.5 10/01/2023   GFR 92.33 10/01/2023   GFRNONAA 117.42 01/17/2010   Lab Results  Component Value Date   ALT 35 10/01/2023   AST 25 10/01/2023   ALKPHOS 123 (H) 10/01/2023   BILITOT 0.6 10/01/2023    Lab Results  Component Value Date   TSH 1.49 10/01/2023       Patient Active Problem List   Diagnosis Date  Noted   Diabetes mellitus screening 09/29/2023   Hyperlipidemia 10/04/2020   Iron  deficiency 09/20/2020   History of breast cancer 09/20/2020   Breast cancer of upper-outer quadrant of left female breast (HCC) 10/24/2015   Genetic testing 10/24/2015   Family history of breast cancer in first degree relative 10/04/2015   Family history of colon cancer 10/04/2015   Obesity 09/20/2014   Menopausal symptoms 01/16/2013   Routine general medical examination at a health care facility 02/04/2011   FIBROCYSTIC BREAST DISEASE 10/20/2007   PANIC DISORDER 10/17/2007   Past Medical History:  Diagnosis Date   Anemia    Arthritis    THUMB   Breast cancer (HCC)    Cancer (HCC) 10/2015   left breast/last radiation tx08/17/ no chemo   Fibrocystic breast    GERD (gastroesophageal reflux disease)    Hyperlipidemia    NO MEDS   Panic disorder    Personal history of radiation therapy    Post-menopausal    Past Surgical History:  Procedure Laterality Date   BREAST BIOPSY Left 09/28/2015   BREAST LUMPECTOMY Left 11/02/2015   BREAST LUMPECTOMY WITH RADIOACTIVE SEED AND SENTINEL LYMPH NODE BIOPSY Left 11/02/2015   Procedure: BREAST LUMPECTOMY WITH RADIOACTIVE SEED AND SENTINEL LYMPH NODE BIOPSY;  Surgeon: Lockie Rima, MD;  Location: [facility];  Service: General;  Laterality: Left;   CESAREAN SECTION     COLONOSCOPY     fibroid tumor     PILONIDAL CYST EXCISION     VAGINAL HYSTERECTOMY     partial   Social History   Tobacco Use   Smoking status: Never   Smokeless tobacco: Never   Tobacco comments:    tried smoking as a teenager, but no continued use  Vaping Use   Vaping status: Never Used  Substance Use Topics   Alcohol use: No   Drug use: No   Family History  Problem Relation Age of Onset   Hypertension Mother    Diabetes Mother    Heart failure Mother 37   Breast cancer Mother 24       +lump   Heart Problems Father        valve replacement   Kidney failure  Father 37   Prostate cancer Father  dx. later age; did not cause his death   Breast cancer Sister        dx. 39s; s/p mastectomy   Breast cancer Sister 66       s/p lump; negative genetic testing   Fibroids Sister    Breast cancer Sister 67       "cancerous cells"; s/p radiation   Fibroids Sister    Fibroids Sister    Bladder Cancer Brother 47       smoker; had issues for 7-8 yrs before going to the doctor   Lung cancer Brother        s/p surgery to remove nodule?; not a smoker; dx. 61-62   Colon cancer Maternal Aunt        dx. 2s   Alzheimer's disease Maternal Aunt        d. 48   Heart Problems Maternal Uncle    Colon cancer Maternal Uncle        dx. in 97s or younger   Bladder Cancer Paternal Uncle        dx. late 70s-80s   Aneurysm Maternal Grandmother        brain; d. 30s   Colon cancer Maternal Grandfather        d. 55s-50s   Heart disease Paternal Grandmother        d. 14s   Heart attack Paternal Grandmother    Heart disease Paternal Grandfather        d. 34s   Heart attack Paternal Grandfather    Lung cancer Cousin        maternal 1st cousin dx. 27s; smoker   Prostate cancer Cousin        maternal 1st cousin dx. early 18s   Melanoma Cousin        maternal 1st cousin dx. 30s   Breast cancer Cousin 39       maternal 1st cousin    Kidney cancer Cousin        maternal 1st cousin dx. 40s   Breast cancer Cousin        paternal 1st cousin dx. 60s   Cancer Cousin        (x2) paternal 1st cousins d. unspecified cancers at unspecified ages   Colon polyps Neg Hx    Esophageal cancer Neg Hx    Rectal cancer Neg Hx    Stomach cancer Neg Hx    Ulcerative colitis Neg Hx    No Known Allergies Current Outpatient Medications on File Prior to Visit  Medication Sig Dispense Refill   ALPRAZolam  (XANAX ) 0.5 MG tablet Take 1 tablet (0.5 mg total) by mouth 2 (two) times daily as needed for anxiety (severe anxiety). Cautoin of sedation , do not drive with this 10 tablet  0   Calcium Carbonate-Vitamin D (CALTRATE 600+D PO) Take by mouth daily.     Ferrous Gluconate (IRON ) 240 (27 Fe) MG TABS Take 1 capsule by mouth daily.     Multiple Vitamin (MULTIVITAMIN) tablet Take 1 tablet by mouth daily.     No current facility-administered medications on file prior to visit.    Review of Systems  Constitutional:  Negative for activity change, appetite change, fatigue, fever and unexpected weight change.  HENT:  Negative for congestion, ear pain, rhinorrhea, sinus pressure and sore throat.   Eyes:  Negative for pain, redness and visual disturbance.  Respiratory:  Negative for cough, shortness of breath and wheezing.   Cardiovascular:  Negative for chest pain and palpitations.  Gastrointestinal:  Negative for abdominal pain, blood in stool, constipation and diarrhea.  Endocrine: Negative for polydipsia and polyuria.  Genitourinary:  Negative for dysuria, frequency and urgency.  Musculoskeletal:  Negative for arthralgias, back pain and myalgias.  Skin:  Negative for pallor and rash.  Allergic/Immunologic: Negative for environmental allergies.  Neurological:  Negative for dizziness, syncope and headaches.  Hematological:  Negative for adenopathy. Does not bruise/bleed easily.  Psychiatric/Behavioral:  Negative for decreased concentration and dysphoric mood. The patient is not nervous/anxious.        Objective:   Physical Exam Constitutional:      General: She is not in acute distress.    Appearance: Normal appearance. She is well-developed. She is obese. She is not ill-appearing or diaphoretic.  HENT:     Head: Normocephalic and atraumatic.     Right Ear: Tympanic membrane, ear canal and external ear normal.     Left Ear: Tympanic membrane, ear canal and external ear normal.     Nose: Nose normal. No congestion.     Mouth/Throat:     Mouth: Mucous membranes are moist.     Pharynx: Oropharynx is clear. No posterior oropharyngeal erythema.  Eyes:     General:  No scleral icterus.    Extraocular Movements: Extraocular movements intact.     Conjunctiva/sclera: Conjunctivae normal.     Pupils: Pupils are equal, round, and reactive to light.  Neck:     Thyroid : No thyromegaly.     Vascular: No carotid bruit or JVD.  Cardiovascular:     Rate and Rhythm: Normal rate and regular rhythm.     Pulses: Normal pulses.     Heart sounds: Normal heart sounds.     No gallop.  Pulmonary:     Effort: Pulmonary effort is normal. No respiratory distress.     Breath sounds: Normal breath sounds. No wheezing.     Comments: Good air exch Chest:     Chest wall: No tenderness.  Abdominal:     General: Bowel sounds are normal. There is no distension or abdominal bruit.     Palpations: Abdomen is soft. There is no mass.     Tenderness: There is no abdominal tenderness.     Hernia: No hernia is present.  Genitourinary:    Comments: Breast and pelvic exam are done by gyn provider   Musculoskeletal:        General: No tenderness. Normal range of motion.     Cervical back: Normal range of motion and neck supple. No rigidity. No muscular tenderness.     Right lower leg: No edema.     Left lower leg: No edema.     Comments: No kyphosis   Lymphadenopathy:     Cervical: No cervical adenopathy.  Skin:    General: Skin is warm and dry.     Coloration: Skin is not pale.     Findings: No erythema or rash.     Comments: Few lentigines   Neurological:     Mental Status: She is alert. Mental status is at baseline.     Cranial Nerves: No cranial nerve deficit.     Motor: No abnormal muscle tone.     Coordination: Coordination normal.     Gait: Gait normal.     Deep Tendon Reflexes: Reflexes are normal and symmetric. Reflexes normal.  Psychiatric:        Mood and Affect: Mood normal.        Cognition and Memory: Cognition and memory  normal.           Assessment & Plan:   Problem List Items Addressed This Visit       Other   Routine general medical  examination at a health care facility - Primary   Reviewed health habits including diet and exercise and skin cancer prevention Reviewed appropriate screening tests for age  Also reviewed health mt list, fam hx and immunization status , as well as social and family history   See HPI Labs reviewed and ordered Health Maintenance  Topic Date Due   Pap with HPV screening  08/06/2023   COVID-19 Vaccine (4 - 2024-25 season) 08/31/2024*   Hepatitis C Screening  10/08/2024*   HIV Screening  10/08/2024*   Zoster (Shingles) Vaccine (2 of 2) 01/07/2025*   Mammogram  11/16/2023   Flu Shot  01/17/2024   Colon Cancer Screening  08/16/2027   DTaP/Tdap/Td vaccine (5 - Td or Tdap) 10/01/2032   HPV Vaccine  Aged Out   Meningitis B Vaccine  Aged Out  *Topic was postponed. The date shown is not the original due date.   Due for 2nd shingles vaccine but ins changed and wants to make sure it is covered here before she gets it   Utd gyn care/ sent for pap report Dexa 2023 normal  Discussed fall prevention, supplements and exercise for bone density  PHQ 2 from some fatigue / menopause wise         Obesity   Discussed how this problem influences overall health and the risks it imposes  Reviewed plan for weight loss with lower calorie diet (via better food choices (lower glycemic and portion control) along with exercise building up to or more than 30 minutes 5 days per week including some aerobic activity and strength training         Iron  deficiency   Taking ferrous gluconate 240 mg daily and labs are in normal range/stable       Hyperlipidemia   Disc goals for lipids and reasons to control them Rev last labs with pt Rev low sat fat diet in detail  HDL remains high  LDL on one teens         History of breast cancer   Doing well  Now off anastrozole  Oncology signed off  Sees gyn for exams  Mammogram is due next month-pt aware        Family history of colon cancer   Colonoscopy  07/2022 with 5 y recall       Diabetes mellitus screening   Lab Results  Component Value Date   HGBA1C 5.6 10/01/2023   disc imp of low glycemic diet and wt loss to prevent DM2

## 2023-10-09 NOTE — Patient Instructions (Addendum)
 For cholesterol Avoid red meat/ fried foods/ egg yolks/ fatty breakfast meats/ butter, cheese and high fat dairy/ and shellfish    For general health  Try to get most of your carbohydrates from produce (with the exception of white potatoes) and whole grains Eat less bread/pasta/rice/snack foods/cereals/sweets and other items from the middle of the grocery store (processed carbs)    Add some strength training to your routine, this is important for bone and brain health and can reduce your risk of falls and help your body use insulin properly and regulate weight  Light weights, exercise bands , and internet videos are a good way to start  Yoga (chair or regular), machines , floor exercises or a gym with machines are also good options   You are due for 2nd shingrix  vaccine  If you are interested in the shingles vaccine series (Shingrix ), call your insurance or pharmacy to check on coverage and location it must be given.  If affordable - you can schedule it here or at your pharmacy depending on coverage

## 2023-10-11 ENCOUNTER — Other Ambulatory Visit: Payer: Self-pay | Admitting: Hematology and Oncology

## 2023-10-11 DIAGNOSIS — Z1231 Encounter for screening mammogram for malignant neoplasm of breast: Secondary | ICD-10-CM

## 2023-10-15 ENCOUNTER — Encounter: Payer: Self-pay | Admitting: Family Medicine

## 2023-10-23 ENCOUNTER — Ambulatory Visit

## 2023-10-23 DIAGNOSIS — Z23 Encounter for immunization: Secondary | ICD-10-CM

## 2023-10-23 NOTE — Progress Notes (Signed)
 Per orders of Dr. Deri Fleet, injection of shingrix  given by Claretha Crocker in right deltoid. Patient tolerated injection well. Pt waited few mins after injection with no complaints or problems.

## 2023-11-18 ENCOUNTER — Ambulatory Visit
Admission: RE | Admit: 2023-11-18 | Discharge: 2023-11-18 | Disposition: A | Discharge status: Home / Self Care | Source: Ambulatory Visit | Attending: Hematology and Oncology | Admitting: Hematology and Oncology

## 2023-11-18 DIAGNOSIS — Z1231 Encounter for screening mammogram for malignant neoplasm of breast: Secondary | ICD-10-CM

## 2023-12-12 ENCOUNTER — Other Ambulatory Visit: Payer: Self-pay | Admitting: Family Medicine

## 2023-12-13 NOTE — Telephone Encounter (Signed)
 Name of Medication: Xanax  Name of Pharmacy: [facility] [address] Dayla Mazzoni or Written Date and Quantity: 10/18/23#10 tabs/ 0 refills  Last Office Visit and Type: CPE on 10/09/23 Next Office Visit and Type: none scheduled

## 2024-04-14 ENCOUNTER — Ambulatory Visit: Payer: Self-pay

## 2024-04-14 NOTE — Telephone Encounter (Signed)
 I don't know much about it for cosmetic use  Do use eye protection if indicated   Watch out for skin irritation

## 2024-04-14 NOTE — Telephone Encounter (Signed)
 Copied from CRM [id number]. Topic: Clinical - Medical Advice >> Apr 14, 2024  8:34 AM Jamie Shaffer wrote: Reason for CRM: Patient would like to receive a phone call to ask a few medical questions to a nurse.    [phone] (M)

## 2024-04-14 NOTE — Telephone Encounter (Signed)
  Answer Assessment - Initial Assessment Questions 1. REASON FOR CALL or QUESTION: What is your reason for calling today? or How can I best  Patient has been going to a spa and using red therapy light. Her friend advised her to ask her doctor.   This RN advised that a message will be sent to PCP to confirm. States a MyChart response is preferred.  Protocols used: PCP Call - No Triage-A-AH

## 2024-04-15 NOTE — Telephone Encounter (Signed)
 Pt notified of Dr. Graham comments

## 2024-06-02 ENCOUNTER — Ambulatory Visit: Payer: Self-pay

## 2024-06-02 NOTE — Telephone Encounter (Signed)
 FYI Only or Action Required?: Action required by provider: clinical question for provider. Pt asking for additional recommendations from PCP or prescription sent to pharmacy, declines OV and advises she will continue home care and call back if she would like OV. This RN educated pt on home care, new-worsening symptoms, when to call back/seek emergent care. Pt verbalized understanding and agrees to plan.   Patient was last seen in primary care on 10/09/2023 by Randeen Laine LABOR, MD.  Called Nurse Triage reporting Back Pain.  Symptoms began several days ago.  Interventions attempted: OTC medications: Aleve .  Symptoms are: stable.  Triage Disposition: See PCP When Office is Open (Within 3 Days)  Patient/caregiver understands and will follow disposition?: No, wishes to speak with PCP    Copied from CRM #[id number]. Topic: Clinical - Red Word Triage >> Jun 01, 2024  4:11 PM Alexandria E wrote: [facility] that prompted transfer to Nurse Triage: Lower back pain, going on for several days, back feels aggravated. >> Jun 01, 2024  4:18 PM Alexandria E wrote: Patient did not want to hold, would prefer a nurse to call her back, thank you. Reason for Disposition  [1] MODERATE back pain (e.g., interferes with normal activities) AND [2] present > 3 days  Answer Assessment - Initial Assessment Questions 1. ONSET: When did the pain begin? (e.g., minutes, hours, days)     X 3 days 2. LOCATION: Where does it hurt? (upper, mid or lower back)     Low back pain bilateral 3. SEVERITY: How bad is the pain?  (e.g., Scale 1-10; mild, moderate, or severe)     7/10 4. PATTERN: Is the pain constant? (e.g., yes, no; constant, intermittent)      Intermittent, worse near the end of day 5. RADIATION: Does the pain shoot into your legs or somewhere else?     None 6. CAUSE:  What do you think is causing the back pain?      Unknown 7. BACK OVERUSE:  Any recent lifting of heavy objects, strenuous work or  exercise?     Pt reports doing more recently with Christmas activities recently 8. MEDICINES: What have you taken so far for the pain? (e.g., nothing, acetaminophen , NSAIDS)     Pt has tried Advil 9. NEUROLOGIC SYMPTOMS: Do you have any weakness, numbness, or problems with bowel/bladder control?     None 10. OTHER SYMPTOMS: Do you have any other symptoms? (e.g., fever, abdomen pain, burning with urination, blood in urine)       Denies  Protocols used: Back Pain-A-AH

## 2024-06-02 NOTE — Telephone Encounter (Signed)
 Aware Agree with advisement  Follow up if worse or UC/ER if severe

## 2024-10-09 ENCOUNTER — Encounter: Admitting: Family Medicine

## 2024-11-23 ENCOUNTER — Ambulatory Visit
